# Patient Record
Sex: Female | Born: 1940 | Race: White | Hispanic: No | Marital: Married | Smoking: Never smoker
Health system: Southern US, Community
[De-identification: ages and names within clinical notes are randomized; demographics above are authoritative.]

## PROBLEM LIST (undated history)

## (undated) DIAGNOSIS — F419 Anxiety disorder, unspecified: Secondary | ICD-10-CM

## (undated) DIAGNOSIS — E876 Hypokalemia: Secondary | ICD-10-CM

## (undated) DIAGNOSIS — I10 Essential (primary) hypertension: Secondary | ICD-10-CM

## (undated) DIAGNOSIS — I639 Cerebral infarction, unspecified: Secondary | ICD-10-CM

## (undated) DIAGNOSIS — H8110 Benign paroxysmal vertigo, unspecified ear: Secondary | ICD-10-CM

## (undated) DIAGNOSIS — K449 Diaphragmatic hernia without obstruction or gangrene: Secondary | ICD-10-CM

## (undated) DIAGNOSIS — E785 Hyperlipidemia, unspecified: Secondary | ICD-10-CM

## (undated) DIAGNOSIS — D649 Anemia, unspecified: Secondary | ICD-10-CM

## (undated) DIAGNOSIS — R42 Dizziness and giddiness: Secondary | ICD-10-CM

## (undated) HISTORY — PX: TUBAL LIGATION: SHX77

## (undated) HISTORY — PX: CATARACT EXTRACTION: SUR2

---

## 2009-04-09 ENCOUNTER — Emergency Department (HOSPITAL_COMMUNITY): Admission: EM | Admit: 2009-04-09 | Discharge: 2009-04-09 | Payer: Self-pay | Admitting: Emergency Medicine

## 2013-02-02 SURGERY — LAPAROSCOPIC CHOLECYSTECTOMY WITH INTRAOPERATIVE CHOLANGIOGRAM
Anesthesia: General

## 2013-09-01 ENCOUNTER — Encounter (HOSPITAL_COMMUNITY): Payer: Self-pay | Admitting: *Deleted

## 2013-09-01 ENCOUNTER — Emergency Department (HOSPITAL_COMMUNITY): Payer: Medicare Other

## 2013-09-01 ENCOUNTER — Observation Stay (HOSPITAL_COMMUNITY)
Admission: EM | Admit: 2013-09-01 | Discharge: 2013-09-02 | Disposition: A | Discharge status: Home / Self Care | Payer: Medicare Other | Attending: Internal Medicine | Admitting: Internal Medicine

## 2013-09-01 DIAGNOSIS — R112 Nausea with vomiting, unspecified: Secondary | ICD-10-CM | POA: Diagnosis present

## 2013-09-01 DIAGNOSIS — E876 Hypokalemia: Secondary | ICD-10-CM | POA: Insufficient documentation

## 2013-09-01 DIAGNOSIS — I10 Essential (primary) hypertension: Secondary | ICD-10-CM | POA: Diagnosis present

## 2013-09-01 DIAGNOSIS — Z79899 Other long term (current) drug therapy: Secondary | ICD-10-CM | POA: Insufficient documentation

## 2013-09-01 DIAGNOSIS — F411 Generalized anxiety disorder: Secondary | ICD-10-CM | POA: Diagnosis present

## 2013-09-01 DIAGNOSIS — Z7982 Long term (current) use of aspirin: Secondary | ICD-10-CM | POA: Insufficient documentation

## 2013-09-01 DIAGNOSIS — R5381 Other malaise: Secondary | ICD-10-CM | POA: Insufficient documentation

## 2013-09-01 DIAGNOSIS — K449 Diaphragmatic hernia without obstruction or gangrene: Secondary | ICD-10-CM | POA: Insufficient documentation

## 2013-09-01 DIAGNOSIS — R42 Dizziness and giddiness: Principal | ICD-10-CM

## 2013-09-01 DIAGNOSIS — K219 Gastro-esophageal reflux disease without esophagitis: Secondary | ICD-10-CM | POA: Insufficient documentation

## 2013-09-01 HISTORY — DX: Diaphragmatic hernia without obstruction or gangrene: K44.9

## 2013-09-01 HISTORY — DX: Benign paroxysmal vertigo, unspecified ear: H81.10

## 2013-09-01 HISTORY — DX: Hypokalemia: E87.6

## 2013-09-01 HISTORY — DX: Anxiety disorder, unspecified: F41.9

## 2013-09-01 LAB — COMPREHENSIVE METABOLIC PANEL
ALT: 28 U/L (ref 0–35)
AST: 28 U/L (ref 0–37)
Albumin: 4.1 g/dL (ref 3.5–5.2)
Alkaline Phosphatase: 86 U/L (ref 39–117)
Glucose, Bld: 142 mg/dL — ABNORMAL HIGH (ref 70–99)
Potassium: 3.7 mEq/L (ref 3.5–5.1)
Sodium: 136 mEq/L (ref 135–145)
Total Protein: 7.3 g/dL (ref 6.0–8.3)

## 2013-09-01 LAB — CBC WITH DIFFERENTIAL/PLATELET
Basophils Absolute: 0 10*3/uL (ref 0.0–0.1)
Basophils Relative: 0 % (ref 0–1)
Eosinophils Absolute: 0 10*3/uL (ref 0.0–0.7)
Lymphs Abs: 1.7 10*3/uL (ref 0.7–4.0)
MCH: 31.5 pg (ref 26.0–34.0)
Neutrophils Relative %: 69 % (ref 43–77)
Platelets: 207 10*3/uL (ref 150–400)
RBC: 4.54 MIL/uL (ref 3.87–5.11)

## 2013-09-01 LAB — URINALYSIS, ROUTINE W REFLEX MICROSCOPIC
Bilirubin Urine: NEGATIVE
Glucose, UA: NEGATIVE mg/dL
Hgb urine dipstick: NEGATIVE
Ketones, ur: 40 mg/dL — AB
pH: 7 (ref 5.0–8.0)

## 2013-09-01 LAB — TROPONIN I: Troponin I: <0.3 ng/mL (ref <0.30)

## 2013-09-01 MED ORDER — ENOXAPARIN SODIUM 40 MG/0.4ML ~~LOC~~ SOLN: 40.0000 mg | SUBCUTANEOUS | Status: DC

## 2013-09-01 MED ORDER — SCOPOLAMINE 1 MG/3DAYS TD PT72
MEDICATED_PATCH | TRANSDERMAL | Status: AC
  Filled 2013-09-01: qty 1

## 2013-09-01 MED ORDER — SODIUM CHLORIDE 0.9 % IJ SOLN
3.0000 mL | Freq: Two times a day (BID) | INTRAMUSCULAR | Status: DC
  Administered 2013-09-02: 3 mL via INTRAVENOUS

## 2013-09-01 MED ORDER — LORATADINE 10 MG PO TABS
10.0000 mg | ORAL_TABLET | Freq: Every day | ORAL | Status: DC
  Administered 2013-09-01 – 2013-09-02 (×2): 10 mg via ORAL
  Filled 2013-09-01 (×2): qty 1

## 2013-09-01 MED ORDER — PANTOPRAZOLE SODIUM 40 MG PO TBEC
40.0000 mg | DELAYED_RELEASE_TABLET | Freq: Every day | ORAL | Status: DC
  Administered 2013-09-01 – 2013-09-02 (×2): 40 mg via ORAL
  Filled 2013-09-01 (×2): qty 1

## 2013-09-01 MED ORDER — POTASSIUM CHLORIDE IN NACL 20-0.9 MEQ/L-% IV SOLN
INTRAVENOUS | Status: DC
  Administered 2013-09-01 – 2013-09-02 (×2): via INTRAVENOUS

## 2013-09-01 MED ORDER — MECLIZINE HCL 12.5 MG PO TABS
25.0000 mg | ORAL_TABLET | Freq: Once | ORAL | Status: AC
  Administered 2013-09-01: 25 mg via ORAL
  Filled 2013-09-01: qty 2

## 2013-09-01 MED ORDER — MECLIZINE HCL 12.5 MG PO TABS
25.0000 mg | ORAL_TABLET | Freq: Three times a day (TID) | ORAL | Status: DC
  Administered 2013-09-01 – 2013-09-02 (×3): 25 mg via ORAL
  Filled 2013-09-01 (×3): qty 2

## 2013-09-01 MED ORDER — PROMETHAZINE HCL 25 MG/ML IJ SOLN
12.5000 mg | Freq: Four times a day (QID) | INTRAMUSCULAR | Status: DC | PRN
  Administered 2013-09-01: 25 mg via INTRAVENOUS
  Administered 2013-09-02: 12.5 mg via INTRAVENOUS
  Filled 2013-09-01 (×2): qty 1

## 2013-09-01 MED ORDER — SODIUM CHLORIDE 0.9 % IV BOLUS (SEPSIS)
500.0000 mL | Freq: Once | INTRAVENOUS | Status: AC
  Administered 2013-09-01: 500 mL via INTRAVENOUS

## 2013-09-01 MED ORDER — ALPRAZOLAM 0.5 MG PO TABS
0.5000 mg | ORAL_TABLET | Freq: Three times a day (TID) | ORAL | Status: DC
  Administered 2013-09-01 – 2013-09-02 (×3): 0.5 mg via ORAL
  Filled 2013-09-01 (×3): qty 1

## 2013-09-01 MED ORDER — SCOPOLAMINE 1 MG/3DAYS TD PT72
1.0000 | MEDICATED_PATCH | TRANSDERMAL | Status: DC
  Administered 2013-09-01: 1.5 mg via TRANSDERMAL
  Filled 2013-09-01 (×2): qty 1

## 2013-09-01 MED ORDER — LORAZEPAM 2 MG/ML IJ SOLN
0.5000 mg | Freq: Once | INTRAMUSCULAR | Status: AC
  Administered 2013-09-01: 0.5 mg via INTRAVENOUS
  Filled 2013-09-01: qty 1

## 2013-09-01 MED ORDER — SODIUM CHLORIDE 0.9 % IV BOLUS (SEPSIS)
1000.0000 mL | Freq: Once | INTRAVENOUS | Status: AC
  Administered 2013-09-01: 1000 mL via INTRAVENOUS

## 2013-09-01 MED ORDER — ACETAMINOPHEN 650 MG RE SUPP: 650.0000 mg | Freq: Four times a day (QID) | RECTAL | Status: DC | PRN

## 2013-09-01 MED ORDER — ACETAMINOPHEN 325 MG PO TABS: 650.0000 mg | ORAL_TABLET | Freq: Four times a day (QID) | ORAL | Status: DC | PRN

## 2013-09-01 MED ORDER — ONDANSETRON HCL 4 MG/2ML IJ SOLN
4.0000 mg | Freq: Once | INTRAMUSCULAR | Status: AC
  Administered 2013-09-01: 4 mg via INTRAVENOUS
  Filled 2013-09-01: qty 2

## 2013-09-01 MED ORDER — ASPIRIN 81 MG PO CHEW
81.0000 mg | CHEWABLE_TABLET | Freq: Every day | ORAL | Status: DC
  Administered 2013-09-01 – 2013-09-02 (×2): 81 mg via ORAL
  Filled 2013-09-01 (×2): qty 1

## 2013-09-01 MED ORDER — BISOPROLOL-HYDROCHLOROTHIAZIDE 10-6.25 MG PO TABS
1.0000 | ORAL_TABLET | Freq: Every day | ORAL | Status: DC
  Administered 2013-09-02: 1 via ORAL
  Filled 2013-09-01 (×4): qty 1

## 2013-09-01 NOTE — ED Notes (Signed)
Pt back from MRI 

## 2013-09-01 NOTE — ED Notes (Signed)
In to check  Orthostatic vital signs. Pt unable to change positions due to vomiting. EDP aware and verbal order given for zofran

## 2013-09-01 NOTE — H&P (Signed)
[facility] History and Physical  Nicole Cobb ZOX:[id number] DOB: 1941/01/07 DOA: 09/01/2013  Referring physician: Dr. Estell Harpin, ER physician PCP: Cassell Smiles., MD  Specialists:   Chief Complaint: Vertigo  HPI: Nicole Cobb is a 72 y.o. female who is relatively healthy. Past medical history includes hypertension, anxiety and GERD. The patient presented with acute onset of vertigo which began this morning. This is associated with nausea and vomiting. She reports her symptoms are significantly worse to return she moves her head. She is unable to standard her symptoms. Denies any chest pain, shortness of breath, cough, fever, recent upper respiratory tract infection, she has not been started on any new medications, no dysuria, diarrhea, abdominal pain. She was brought to the emergency room for evaluation where MRI of the brain did not show any acute infarct. CT of the head was also unremarkable. Basic labs urinalysis was unrevealing. She's been recommended for overnight observation for management of his symptoms.  Review of Systems: Pertinent positives as per history of present illness, otherwise negative  Past Medical History  Diagnosis Date  . Hiatal hernia   . Anxiety   . Hypokalemia    Past Surgical History  Procedure Laterality Date  . Tubal ligation     Social History:  reports that she has never smoked. She does not have any smokeless tobacco history on file. She reports that she does not drink alcohol. Her drug history is not on file.   No Known Allergies  Family history: Mother died of coronary artery disease, father died of cancer  Prior to Admission medications   Medication Sig Start Date End Date Taking? Authorizing Provider  ALPRAZolam Prudy Feeler) 0.5 MG tablet Take 0.5 mg by mouth at bedtime as needed for sleep or anxiety.   Yes Historical Provider, MD  aspirin 81 MG tablet Take 81 mg by mouth daily.   Yes Historical Provider, MD   bisoprolol-hydrochlorothiazide (ZIAC) 10-6.25 MG per tablet Take 1 tablet by mouth daily.   Yes Historical Provider, MD  Cholecalciferol (D3-1000 PO) Take 1,000 mg by mouth daily.   Yes Historical Provider, MD  loratadine (CLARITIN) 10 MG tablet Take 10 mg by mouth daily.   Yes Historical Provider, MD  Misc Natural Products (LUTEIN 20 PO) Take 20 mg by mouth daily.   Yes Historical Provider, MD  naproxen sodium (ANAPROX) 220 MG tablet Take 220 mg by mouth 2 (two) times daily as needed (pain).   Yes Historical Provider, MD  Omega-3 Fatty Acids (OMEGA 3 PO) Take 1 tablet by mouth daily.   Yes Historical Provider, MD  omeprazole (PRILOSEC) 20 MG capsule Take 20 mg by mouth daily.   Yes Historical Provider, MD  vitamin B-12 (CYANOCOBALAMIN) 500 MCG tablet Take 500 mcg by mouth daily.   Yes Historical Provider, MD   Physical Exam: Filed Vitals:   09/01/13 1749  BP: 144/67  Pulse: 70  Temp: 97.4 F (36.3 C)  Resp:      General:  No acute distress  Eyes: Pupils are equal round react to light  ENT: Mucous membranes are moist  Neck: Supple  Cardiovascular: S1, S2, regular rate and rhythm  Respiratory: Clear to auscultation bilaterally  Abdomen: Soft, nontender, positive bowel sounds  Skin: Normal  Musculoskeletal: No pedal edema bilaterally  Psychiatric: Very nervous, pleasant  Neurologic:  Grossly intact, nonfocal  Labs on Admission:  Basic Metabolic Panel:  Recent Labs Lab 09/01/13 1010  NA 136  K 3.7  CL 99  CO2 23  GLUCOSE  142*  BUN 11  CREATININE 0.57  CALCIUM 9.7   Liver Function Tests:  Recent Labs Lab 09/01/13 1010  AST 28  ALT 28  ALKPHOS 86  BILITOT 0.5  PROT 7.3  ALBUMIN 4.1   No results found for this basename: LIPASE, AMYLASE,  in the last 168 hours No results found for this basename: AMMONIA,  in the last 168 hours CBC:  Recent Labs Lab 09/01/13 1010  WBC 6.8  NEUTROABS 4.7  HGB 14.3  HCT 40.5  MCV 89.2  PLT 207   Cardiac  Enzymes: No results found for this basename: CKTOTAL, CKMB, CKMBINDEX, TROPONINI,  in the last 168 hours  BNP (last 3 results) No results found for this basename: PROBNP,  in the last 8760 hours CBG: No results found for this basename: GLUCAP,  in the last 168 hours  Radiological Exams on Admission: Ct Head Wo Contrast  09/01/2013   *RADIOLOGY REPORT*  Clinical Data: Dizziness, weakness, nausea  CT HEAD WITHOUT CONTRAST  Technique:  Contiguous axial images were obtained from the base of the skull through the vertex without contrast.  Comparison: None.  Findings: Mild brain atrophy.  No acute intracranial hemorrhage, mass lesion, acute infarction, midline shift, herniation, hydrocephalus, or extra-axial fluid collection.  Gray-white matter differentiation maintained.  Cisterns patent.  No cerebellar abnormality.  Mastoids and sinuses clear.  IMPRESSION: Mild atrophy.  No acute finding.   Original Report Authenticated By: Judie Petit. Miles Costain, M.D.   Mr Brain Wo Contrast  09/01/2013   *RADIOLOGY REPORT*  Clinical Data: Awoke with severe dizziness, weakness, nausea and vomiting.  MRI HEAD WITHOUT CONTRAST  Technique:  Multiplanar, multiecho pulse sequences of the brain and surrounding structures were obtained according to standard protocol without intravenous contrast.  Comparison: 09/01/2013 head CT.  Findings: No acute infarct. Artifact central pons (series 3 image 35)  No intracranial hemorrhage.  Remote tiny left cerebellar infarct.  Very mild small vessel disease type changes.  No hydrocephalus.  Major intracranial vascular structures are patent.  Mild age related atrophy without hydrocephalus.  No intracranial mass lesion detected on this unenhanced exam.  Minimal mucosal thickening ethmoid sinus air cells.  Cervical medullary junction, pituitary region, pineal region and orbital structures unremarkable.  Mild hyperostosis frontalis interna.  IMPRESSION: No acute infarct.  Very mild small vessel disease type  changes.   Original Report Authenticated By: Lacy Duverney, M.D.    EKG: Independently reviewed. Normal EKG  Assessment/Plan Active Problems:   Vertigo   Nausea with vomiting   Essential hypertension, benign   Anxiety state, unspecified   1. Vertigo, likely benign positional. We'll start the patient on Antivert/scopolamine patch. We'll request a physical therapy consultation for vestibular therapy. Check orthostatics, monitor on telemetry, check TSH and cardiac markers. 2. Anxiety. Continue Xanax which she takes at home. 3. Nausea and vomiting. A cumulative number one, treat supportively 4. Hypertension. Appears controlled, continue outpatient regimen.  Code Status: full code Family Communication: discussed with patient and family at the bedside Disposition Plan: probable discharge home in am  Time spent:  MEMON,JEHANZEB [facility] Pager [phone]  If 7PM-7AM, please contact night-coverage [email or web address] Password [facility] 09/01/2013, 7:39 PM

## 2013-09-01 NOTE — ED Provider Notes (Signed)
CSN: [id number]     Arrival date & time 09/01/13  4098 History  This chart was scribed for Benny Lennert, MD, by Yevette Edwards, ED Scribe. This patient was seen in room APA08/APA08 and the patient's care was started at 9:54 AM.  First MD Initiated Contact with Patient 09/01/13 [phone]     Chief Complaint  Patient presents with  . Dizziness    Patient is a 72 y.o. female presenting with weakness. The history is provided by the patient. No language interpreter was used.  Weakness This is a new problem. The current episode started 3 to 5 hours ago. The problem occurs constantly. The problem has not changed since onset.Associated symptoms include headaches. Pertinent negatives include no chest pain and no abdominal pain. Nothing aggravates the symptoms. Nothing relieves the symptoms. She has tried nothing for the symptoms.   HPI Comments: CALIAH KOPKE is a 72 y.o. female, with a h/o hypokalemia, who presents to the Emergency Department complaining of acute, constant dizziness which began this morning upon awakening. The pt has also experienced generalized weakness, a headache, nausea, and several episodes of emesis. She denies a fever, cough, vertigo, neck pain, and congestion.   Past Medical History  Diagnosis Date  . Hiatal hernia   . Anxiety   . Hypokalemia    Past Surgical History  Procedure Laterality Date  . Tubal ligation     No family history on file. History  Substance Use Topics  . Smoking status: Never Smoker   . Smokeless tobacco: Not on file  . Alcohol Use: No   No OB history provided.  Review of Systems  Constitutional: Negative for fever, appetite change and fatigue.  HENT: Negative for congestion, neck pain, sinus pressure and ear discharge.   Eyes: Negative for discharge.  Respiratory: Negative for cough.   Cardiovascular: Negative for chest pain.  Gastrointestinal: Positive for nausea and vomiting. Negative for abdominal pain and diarrhea.  Genitourinary:  Negative for frequency and hematuria.  Musculoskeletal: Negative for back pain.  Skin: Negative for rash.  Neurological: Positive for weakness and headaches. Negative for seizures.  Psychiatric/Behavioral: Negative for hallucinations.    Allergies  Review of patient's allergies indicates no known allergies.  Home Medications  No current outpatient prescriptions on file.  Triage Vitals: BP 158/70  Pulse 66  Resp 20  Ht 5\' 4"  (1.626 m)  Wt 159 lb (72.122 kg)  BMI 27.28 kg/m2  SpO2 99%  Physical Exam  Constitutional: She is oriented to person, place, and time. She appears well-developed.  HENT:  Head: Normocephalic.  Eyes: Conjunctivae and EOM are normal. No scleral icterus.  Neck: Neck supple. No thyromegaly present.  Cardiovascular: Normal rate and regular rhythm.  Exam reveals no gallop and no friction rub.   No murmur heard. Pulmonary/Chest: No stridor. She has no wheezes. She has no rales. She exhibits no tenderness.  Abdominal: She exhibits no distension. There is no tenderness. There is no rebound.  Musculoskeletal: Normal range of motion. She exhibits no edema.  Lymphadenopathy:    She has no cervical adenopathy.  Neurological: She is oriented to person, place, and time. Coordination normal.  Skin: No rash noted. No erythema.  Psychiatric: She has a normal mood and affect. Her behavior is normal.    ED Course  Procedures (including critical care time)  DIAGNOSTIC STUDIES: Oxygen Saturation is 99% on room air, normal by my interpretation.    COORDINATION OF CARE:  9:59 AM- Discussed treatment plan with patient, and  the patient agreed to the plan.   Labs Review Labs Reviewed  COMPREHENSIVE METABOLIC PANEL - Abnormal; Notable for the following:    Glucose, Bld 142 (*)    All other components within normal limits  CBC WITH DIFFERENTIAL   Imaging Review Ct Head Wo Contrast  09/01/2013   *RADIOLOGY REPORT*  Clinical Data: Dizziness, weakness, nausea  CT HEAD  WITHOUT CONTRAST  Technique:  Contiguous axial images were obtained from the base of the skull through the vertex without contrast.  Comparison: None.  Findings: Mild brain atrophy.  No acute intracranial hemorrhage, mass lesion, acute infarction, midline shift, herniation, hydrocephalus, or extra-axial fluid collection.  Gray-white matter differentiation maintained.  Cisterns patent.  No cerebellar abnormality.  Mastoids and sinuses clear.  IMPRESSION: Mild atrophy.  No acute finding.   Original Report Authenticated By: Judie Petit. Shick, M.D.    MDM  No diagnosis found.  The chart was scribed for me under my direct supervision.  I personally performed the history, physical, and medical decision making and all procedures in the evaluation of this patient.Benny Lennert, MD 09/05/13 2136

## 2013-09-01 NOTE — ED Notes (Signed)
Pt requesting to use bedpan - voided 450cc

## 2013-09-01 NOTE — ED Notes (Signed)
C/o dizziness, unable to raise head,nausea and vomiting, denies pain

## 2013-09-02 ENCOUNTER — Encounter (HOSPITAL_COMMUNITY): Payer: Self-pay | Admitting: Internal Medicine

## 2013-09-02 LAB — BASIC METABOLIC PANEL
BUN: 7 mg/dL (ref 6–23)
CO2: 24 mEq/L (ref 19–32)
Calcium: 8.9 mg/dL (ref 8.4–10.5)
Chloride: 106 mEq/L (ref 96–112)
Creatinine, Ser: 0.59 mg/dL (ref 0.50–1.10)
Glucose, Bld: 106 mg/dL — ABNORMAL HIGH (ref 70–99)

## 2013-09-02 LAB — CBC
MCV: 90.8 fL (ref 78.0–100.0)
Platelets: 206 10*3/uL (ref 150–400)
RBC: 4.36 MIL/uL (ref 3.87–5.11)
RDW: 13.2 % (ref 11.5–15.5)
WBC: 7.6 10*3/uL (ref 4.0–10.5)

## 2013-09-02 LAB — TROPONIN I
Troponin I: <0.3 ng/mL (ref <0.30)
Troponin I: <0.3 ng/mL (ref <0.30)

## 2013-09-02 LAB — TSH: TSH: 1.099 u[IU]/mL (ref 0.350–4.500)

## 2013-09-02 MED ORDER — ALPRAZOLAM 0.5 MG PO TABS: 0.5000 mg | ORAL_TABLET | Freq: Two times a day (BID) | ORAL | Status: DC | PRN

## 2013-09-02 MED ORDER — MECLIZINE HCL 25 MG PO TABS: 25.0000 mg | ORAL_TABLET | Freq: Three times a day (TID) | ORAL | Status: DC | PRN

## 2013-09-02 MED ORDER — SCOPOLAMINE 1 MG/3DAYS TD PT72: 1.0000 | MEDICATED_PATCH | TRANSDERMAL | Status: DC

## 2013-09-02 MED ORDER — PROMETHAZINE HCL 12.5 MG PO TABS: 25.0000 mg | ORAL_TABLET | Freq: Four times a day (QID) | ORAL | Status: DC | PRN

## 2013-09-02 NOTE — Discharge Summary (Signed)
Physician Discharge Summary  Nicole Cobb ZOX:[id number] DOB: 06-Jul-1941 DOA: 09/01/2013  PCP: Cassell Smiles., MD  Admit date: 09/01/2013 Discharge date: 09/02/2013  Time spent: 45 minutes  Recommendations for Outpatient Follow-up:  1. Followup with primary care physician in one to 2 weeks  Discharge Diagnoses:  Active Problems:   Vertigo   Nausea with vomiting   Essential hypertension, benign   Anxiety state, unspecified   Discharge Condition: Improved  Diet recommendation: Low-salt  Filed Weights   09/01/13 0903 09/01/13 1749  Weight: 72.122 kg (159 lb) 72.122 kg (159 lb)    History of present illness:  Nicole Cobb is a 72 y.o. female who is relatively healthy. Past medical history includes hypertension, anxiety and GERD. The patient presented with acute onset of vertigo which began this morning. This is associated with nausea and vomiting. She reports her symptoms are significantly worse to return she moves her head. She is unable to standard her symptoms. Denies any chest pain, shortness of breath, cough, fever, recent upper respiratory tract infection, she has not been started on any new medications, no dysuria, diarrhea, abdominal pain. She was brought to the emergency room for evaluation where MRI of the brain did not show any acute infarct. CT of the head was also unremarkable. Basic labs urinalysis was unrevealing. She's been recommended for overnight observation for management of his symptoms   Hospital Course:  This lady was admitted to the hospital with intractable vertigo, nausea and vomiting. She was seen in the emergency room where MRI did not reveal any acute infarct. It was felt that her some more likely related to benign positional vertigo. She was admitted for observation for symptom control. She was started on Antivert, scopolamine patch, Phenergan for nausea. She was not orthostatic on admission. The following day, she reported a marked improvement in  her symptoms. She was seen by physical therapy and occupational therapy who did not recommend any further outpatient therapy. She'll be set up with home health RN. She is felt stable for discharge home today.  Procedures:  none  Consultations:  none  Discharge Exam: Filed Vitals:   09/02/13 1417  BP: 135/58  Pulse: 79  Temp: 97.5 F (36.4 C)  Resp: 20    General: NAD Cardiovascular: S1, S2 RRR Respiratory: CTA B  Discharge Instructions  Discharge Orders   Future Orders Complete By Expires   Call MD for:  extreme fatigue  As directed    Call MD for:  persistant dizziness or light-headedness  As directed    Diet - low sodium heart healthy  As directed    Face-to-face encounter (required for Medicare/Medicaid patients)  As directed    Comments:     I MEMON,JEHANZEB certify that this patient is under my care and that I, or a nurse practitioner or physician's assistant working with me, had a face-to-face encounter that meets the physician face-to-face encounter requirements with this patient on 09/02/2013. The encounter with the patient was in whole, or in part for the following medical condition(s) which is the primary reason for home health care (List medical condition): admitted with severe vertigo and anxiety, would benefit from home health RN since she is a high re admission risk   Questions:     The encounter with the patient was in whole, or in part, for the following medical condition, which is the primary reason for home health care:  vertigo, anxiety   I certify that, based on my findings, the following services are  medically necessary home health services:  Nursing   My clinical findings support the need for the above services:  Unsafe ambulation due to balance issues   Further, I certify that my clinical findings support that this patient is homebound due to:  Unable to leave home safely without assistance   Reason for Medically Necessary Home Health Services:  Skilled  Nursing- Skilled Assessment/Observation   Home Health  As directed    Questions:     To provide the following care/treatments:  RN   Increase activity slowly  As directed        Medication List         ALPRAZolam 0.5 MG tablet  Commonly known as:  XANAX  Take 1 tablet (0.5 mg total) by mouth 2 (two) times daily as needed for sleep or anxiety.     aspirin 81 MG tablet  Take 81 mg by mouth daily.     bisoprolol-hydrochlorothiazide 10-6.25 MG per tablet  Commonly known as:  ZIAC  Take 1 tablet by mouth daily.     D3-1000 PO  Take 1,000 mg by mouth daily.     loratadine 10 MG tablet  Commonly known as:  CLARITIN  Take 10 mg by mouth daily.     LUTEIN 20 PO  Take 20 mg by mouth daily.     meclizine 25 MG tablet  Commonly known as:  ANTIVERT  Take 1 tablet (25 mg total) by mouth 3 (three) times daily as needed for dizziness or nausea.     naproxen sodium 220 MG tablet  Commonly known as:  ANAPROX  Take 220 mg by mouth 2 (two) times daily as needed (pain).     OMEGA 3 PO  Take 1 tablet by mouth daily.     omeprazole 20 MG capsule  Commonly known as:  PRILOSEC  Take 20 mg by mouth daily.     promethazine 12.5 MG tablet  Commonly known as:  PHENERGAN  Take 2 tablets (25 mg total) by mouth every 6 (six) hours as needed for nausea.     scopolamine 1.5 MG  Commonly known as:  TRANSDERM-SCOP  Place 1 patch (1.5 mg total) onto the skin every 3 (three) days.     vitamin B-12 500 MCG tablet  Commonly known as:  CYANOCOBALAMIN  Take 500 mcg by mouth daily.       No Known Allergies    The results of significant diagnostics from this hospitalization (including imaging, microbiology, ancillary and laboratory) are listed below for reference.    Significant Diagnostic Studies: Ct Head Wo Contrast  09/01/2013   *RADIOLOGY REPORT*  Clinical Data: Dizziness, weakness, nausea  CT HEAD WITHOUT CONTRAST  Technique:  Contiguous axial images were obtained from the base of the  skull through the vertex without contrast.  Comparison: None.  Findings: Mild brain atrophy.  No acute intracranial hemorrhage, mass lesion, acute infarction, midline shift, herniation, hydrocephalus, or extra-axial fluid collection.  Gray-white matter differentiation maintained.  Cisterns patent.  No cerebellar abnormality.  Mastoids and sinuses clear.  IMPRESSION: Mild atrophy.  No acute finding.   Original Report Authenticated By: Judie Petit. Miles Costain, M.D.   Mr Brain Wo Contrast  09/01/2013   *RADIOLOGY REPORT*  Clinical Data: Awoke with severe dizziness, weakness, nausea and vomiting.  MRI HEAD WITHOUT CONTRAST  Technique:  Multiplanar, multiecho pulse sequences of the brain and surrounding structures were obtained according to standard protocol without intravenous contrast.  Comparison: 09/01/2013 head CT.  Findings: No acute infarct.  Artifact central pons (series 3 image 35)  No intracranial hemorrhage.  Remote tiny left cerebellar infarct.  Very mild small vessel disease type changes.  No hydrocephalus.  Major intracranial vascular structures are patent.  Mild age related atrophy without hydrocephalus.  No intracranial mass lesion detected on this unenhanced exam.  Minimal mucosal thickening ethmoid sinus air cells.  Cervical medullary junction, pituitary region, pineal region and orbital structures unremarkable.  Mild hyperostosis frontalis interna.  IMPRESSION: No acute infarct.  Very mild small vessel disease type changes.   Original Report Authenticated By: Lacy Duverney, M.D.    Microbiology: No results found for this or any previous visit (from the past 240 hour(s)).   Labs: Basic Metabolic Panel:  Recent Labs Lab 09/01/13 1010 09/02/13 0747  NA 136 140  K 3.7 3.8  CL 99 106  CO2 23 24  GLUCOSE 142* 106*  BUN 11 7  CREATININE 0.57 0.59  CALCIUM 9.7 8.9   Liver Function Tests:  Recent Labs Lab 09/01/13 1010  AST 28  ALT 28  ALKPHOS 86  BILITOT 0.5  PROT 7.3  ALBUMIN 4.1   No  results found for this basename: LIPASE, AMYLASE,  in the last 168 hours No results found for this basename: AMMONIA,  in the last 168 hours CBC:  Recent Labs Lab 09/01/13 1010 09/02/13 0747  WBC 6.8 7.6  NEUTROABS 4.7  --   HGB 14.3 13.6  HCT 40.5 39.6  MCV 89.2 90.8  PLT 207 206   Cardiac Enzymes:  Recent Labs Lab 09/01/13 1956 09/02/13 0152 09/02/13 0747  TROPONINI <0.30 <0.30 <0.30   BNP: BNP (last 3 results) No results found for this basename: PROBNP,  in the last 8760 hours CBG: No results found for this basename: GLUCAP,  in the last 168 hours     Signed:  MEMON,JEHANZEB  [facility] 09/02/2013, 2:27 PM

## 2013-09-02 NOTE — Evaluation (Signed)
Physical Therapy Evaluation Patient Details Name: Nicole Cobb MRN: [id number] DOB: 1941/11/09 Today's Date: 09/02/2013 Time: 4098-1191 PT Time Calculation (min): 36 min  PT Assessment / Plan / Recommendation History of Present Illness  Pt is admitted with positional vertigo.  She awoke yesterday AM with sudden onset of dizziness in any position, so severe that she had nausea and vomiting.  She reports that she is normaly very sensitive to positional changes...i.e. gets car sick easily, cannot sit on a swing, etc.  She has been medicated for vertigo and nausea and today reports a significant improvement of sx.  Clinical Impression   Pt was seen for an evaluation of vertigo and mobility as it relates to her vertigo.  She is very alert and cooperative, almost apologetic for her sx.  She has excellent family support and reams of DME at home.  Currently, her vertigo occurs primarily with QUICK positional head changes.  When she has dizziness, it resolves within 5 seconds if she holds that position.  She was instructed in maintaining SLOW positional or head movements until all sx resolve.   She was also instructed in use of a cane for maximal security while walking.  She and family were instructed in Habituation and Gaze exercise.  She is not highly interested in doing much exercise...she is hoping that her sx continue to resolve with medication and with the control she can exert by managing the speed of her body motions.  OT will be evaluating and may see something that I missed.    PT Assessment  Patent does not need any further PT services    Follow Up Recommendations  No PT follow up    Does the patient have the potential to tolerate intense rehabilitation      Barriers to Discharge        Equipment Recommendations  None recommended by PT    Recommendations for Other Services     Frequency      Precautions / Restrictions Precautions Precautions: Fall Restrictions Weight Bearing  Restrictions: No   Pertinent Vitals/Pain       Mobility  Bed Mobility Bed Mobility: Rolling Right;Supine to Sit Rolling Right: 7: Independent Supine to Sit: 7: Independent Details for Bed Mobility Assistance: pt has no  dizziness in the supine /flat position, however does have dizziness that lasts for no more than 5 seconds when turning to sidelying.........,when transferring to sitting, if she moves very slowly she has no dizziness Transfers Transfers: Sit to Stand;Stand to Sit Sit to Stand: 7: Independent Stand to Sit: 7: Independent Details for Transfer Assistance: no dizziness with standing and when asked to turn her head side to side she has no dizziness Ambulation/Gait Ambulation/Gait Assistance: 5: Supervision Ambulation Distance (Feet): 200 Feet Assistive device: None;Straight cane Ambulation/Gait Assistance Details: pt lost balance x2 while walking and turning her head quickly....she had to be righted by therapist.  She was instructed in use of a cane for maximal stability and this was very helpful to her Gait Pattern: Within Functional Limits Gait velocity: WNL Stairs: No Wheelchair Mobility Wheelchair Mobility: No    Exercises Other Exercises Other Exercises: habituation exercises Other Exercises: gaze exercises   PT Diagnosis:    PT Problem List:   PT Treatment Interventions:       PT Goals(Current goals can be found in the care plan section) Acute Rehab PT Goals PT Goal Formulation: No goals set, d/c therapy  Visit Information  Last PT Received On: 09/02/13 History of Present  Illness: Pt is admitted with positional vertigo.  She awoke yesterday AM with sudden onset of dizziness in any position, so severe that she had nausea and vomiting.  She reports that she is normaly very sensitive to positional changes...i.e. gets car sick easily, cannot sit on a swing, etc.  She has been medicated for vertigo and nausea and today reports a significant improvement of sx.        Prior Functioning  Home Living Family/patient expects to be discharged to:: Private residence Living Arrangements: Spouse/significant other Available Help at Discharge: Family;Available 24 hours/day Type of Home: House Home Access: Stairs to enter [employer] of Steps: 2 Entrance Stairs-Rails: Right Home Layout: One level Home Equipment: Walker - 2 wheels;Cane - single point;Crutches;Bedside commode;Shower seat;Wheelchair - manual Prior Function Level of Independence: Independent Communication Communication: No difficulties    Cognition  Cognition Arousal/Alertness: Awake/alert Behavior During Therapy: WFL for tasks assessed/performed Overall Cognitive Status: Within Functional Limits for tasks assessed    Extremity/Trunk Assessment Upper Extremity Assessment Upper Extremity Assessment: Defer to OT evaluation Lower Extremity Assessment Lower Extremity Assessment: Overall WFL for tasks assessed Cervical / Trunk Assessment Cervical / Trunk Assessment: Normal   Balance Balance Balance Assessed: Yes Dynamic Standing Balance Dynamic Standing - Balance Support: Right upper extremity supported Dynamic Standing - Level of Assistance: 6: Modified independent (Device/Increase time)  End of Session PT - End of Session Equipment Utilized During Treatment: Gait belt Activity Tolerance: Patient tolerated treatment well Patient left: in bed;with call bell/phone within reach  GP Functional Assessment Tool Used: clinical judgement Functional Limitation: Mobility: Walking and moving around Mobility: Walking and Moving Around Current Status (Z6109): 0 percent impaired, limited or restricted Mobility: Walking and Moving Around Goal Status (U0454): 0 percent impaired, limited or restricted Mobility: Walking and Moving Around Discharge Status [phone]): 0 percent impaired, limited or restricted   Myrlene Broker L 09/02/2013, 11:35 AM

## 2013-09-02 NOTE — Progress Notes (Signed)
09/02/13 1753 Reviewed discharge instructions with patient, husband and daughter at bedside. Given copy of instructions, medication list, prescriptions, f/u appointment information. Verbalized understanding of instructions and when to call MD. Dizziness education sheet discussed and provided for patient. IV site d/c'd within normal limits. Notified CMT of telemetry d/c'd for discharge this afternoon. [facility]/c accompanied by nurse tech. Discharged home. Earnstine Regal, RN

## 2013-09-02 NOTE — Evaluation (Signed)
Occupational Therapy Evaluation Patient Details Name: Nicole Cobb MRN: [id number] DOB: [birth year] Today's Date: 09/02/2013 Time: 7829-5621 OT Time Calculation (min): 14 min  OT Assessment / Plan / Recommendation History of present illness Pt is admitted with positional vertigo.  She awoke yesterday AM with sudden onset of dizziness in any position, so severe that she had nausea and vomiting.  She reports that she is normaly very sensitive to positional changes...i.e. gets car sick easily, cannot sit on a swing, etc.  She has been medicated for vertigo and nausea and today reports a significant improvement of sx.   Clinical Impression   OT vestibular evaluation completed after PT. See PT note for additional information. Provided patient and family with education regarding vestibular exercises. Handout given of exercises to perform with demonstration. Patient very appreciative of education.    OT Assessment  Patient does not need any further OT services    Follow Up Recommendations  No OT follow up    Barriers to Discharge    None  Equipment Recommendations  None recommended by OT          Precautions / Restrictions Precautions Precautions: Fall Restrictions Weight Bearing Restrictions: No   Pertinent Vitals/Pain No complaints.         OT Goals(Current goals can be found in the care plan section)  1 time visit.  Visit Information  Last OT Received On: 09/02/13 Assistance Needed: +1 History of Present Illness: Pt is admitted with positional vertigo.  She awoke yesterday AM with sudden onset of dizziness in any position, so severe that she had nausea and vomiting.  She reports that she is normaly very sensitive to positional changes...i.e. gets car sick easily, cannot sit on a swing, etc.  She has been medicated for vertigo and nausea and today reports a significant improvement of sx.       Prior Functioning     Home Living Family/patient expects to be discharged  to:: Private residence Living Arrangements: Spouse/significant other Available Help at Discharge: Family;Available 24 hours/day Type of Home: House Home Access: Stairs to enter [employer] of Steps: 2 Entrance Stairs-Rails: Right Home Layout: One level Home Equipment: Walker - 2 wheels;Cane - single point;Crutches;Bedside commode;Shower seat;Wheelchair - manual Prior Function Level of Independence: Independent Communication Communication: No difficulties         Vision/Perception Vision - History Baseline Vision: Wears glasses all the time Patient Visual Report: Nausea/blurring vision with head movement Vision - Assessment Eye Alignment: Within Functional Limits Vision Assessment: Vision tested Ocular Range of Motion: Within Functional Limits Tracking/Visual Pursuits: Able to track stimulus in all quads without difficulty Saccades: Within functional limits Convergence: Within functional limits Perception Perception: Within Functional Limits Praxis Praxis: Intact   Cognition  Cognition Arousal/Alertness: Awake/alert Behavior During Therapy: WFL for tasks assessed/performed Overall Cognitive Status: Within Functional Limits for tasks assessed    Extremity/Trunk Assessment Upper Extremity Assessment Upper Extremity Assessment: Overall WFL for tasks assessed Lower Extremity Assessment Lower Extremity Assessment: Defer to PT evaluation Cervical / Trunk Assessment Cervical / Trunk Assessment: Normal        Exercise Other Exercises Other Exercises: Vestibular exercises given with a handout and demonstration. Other Exercises: gaze exercises      End of Session OT - End of Session Activity Tolerance: Patient tolerated treatment well Patient left: in bed;with call bell/phone within reach;with family/visitor present  GO Functional Limitation: Other OT primary Other OT Primary Current Status (H0865): 0 percent impaired, limited or restricted Other OT Primary  Goal Status (Z6109): 0 percent impaired, limited or restricted Other OT Primary Discharge Status [phone]): 0 percent impaired, limited or restricted   Limmie Patricia, OTR/L,CBIS   09/02/2013, 12:14 PM

## 2013-11-23 ENCOUNTER — Other Ambulatory Visit (HOSPITAL_COMMUNITY): Payer: Self-pay | Admitting: Internal Medicine

## 2013-11-23 DIAGNOSIS — Z139 Encounter for screening, unspecified: Secondary | ICD-10-CM

## 2013-12-01 ENCOUNTER — Ambulatory Visit (HOSPITAL_COMMUNITY)
Admission: RE | Admit: 2013-12-01 | Discharge: 2013-12-01 | Disposition: A | Discharge status: Home / Self Care | Payer: Medicare Other | Source: Ambulatory Visit | Attending: Internal Medicine | Admitting: Internal Medicine

## 2013-12-01 DIAGNOSIS — Z139 Encounter for screening, unspecified: Secondary | ICD-10-CM

## 2013-12-01 DIAGNOSIS — Z1231 Encounter for screening mammogram for malignant neoplasm of breast: Secondary | ICD-10-CM | POA: Insufficient documentation

## 2014-07-15 ENCOUNTER — Inpatient Hospital Stay (HOSPITAL_COMMUNITY): Payer: Medicare Other

## 2014-07-15 ENCOUNTER — Inpatient Hospital Stay (HOSPITAL_COMMUNITY)
Admission: EM | Admit: 2014-07-15 | Discharge: 2014-07-17 | DRG: 641 | Disposition: A | Discharge status: Home / Self Care | Payer: Medicare Other | Attending: Internal Medicine | Admitting: Internal Medicine

## 2014-07-15 ENCOUNTER — Encounter (HOSPITAL_COMMUNITY): Payer: Self-pay | Admitting: Emergency Medicine

## 2014-07-15 DIAGNOSIS — Z833 Family history of diabetes mellitus: Secondary | ICD-10-CM | POA: Diagnosis not present

## 2014-07-15 DIAGNOSIS — Z79899 Other long term (current) drug therapy: Secondary | ICD-10-CM | POA: Diagnosis not present

## 2014-07-15 DIAGNOSIS — E871 Hypo-osmolality and hyponatremia: Principal | ICD-10-CM | POA: Diagnosis present

## 2014-07-15 DIAGNOSIS — K529 Noninfective gastroenteritis and colitis, unspecified: Secondary | ICD-10-CM | POA: Diagnosis present

## 2014-07-15 DIAGNOSIS — T502X5A Adverse effect of carbonic-anhydrase inhibitors, benzothiadiazides and other diuretics, initial encounter: Secondary | ICD-10-CM | POA: Diagnosis present

## 2014-07-15 DIAGNOSIS — K219 Gastro-esophageal reflux disease without esophagitis: Secondary | ICD-10-CM | POA: Diagnosis present

## 2014-07-15 DIAGNOSIS — I10 Essential (primary) hypertension: Secondary | ICD-10-CM | POA: Diagnosis present

## 2014-07-15 DIAGNOSIS — R7402 Elevation of levels of lactic acid dehydrogenase (LDH): Secondary | ICD-10-CM | POA: Diagnosis present

## 2014-07-15 DIAGNOSIS — E861 Hypovolemia: Secondary | ICD-10-CM | POA: Diagnosis present

## 2014-07-15 DIAGNOSIS — D509 Iron deficiency anemia, unspecified: Secondary | ICD-10-CM | POA: Diagnosis present

## 2014-07-15 DIAGNOSIS — E876 Hypokalemia: Secondary | ICD-10-CM | POA: Diagnosis present

## 2014-07-15 DIAGNOSIS — K5289 Other specified noninfective gastroenteritis and colitis: Secondary | ICD-10-CM | POA: Diagnosis present

## 2014-07-15 DIAGNOSIS — F411 Generalized anxiety disorder: Secondary | ICD-10-CM

## 2014-07-15 DIAGNOSIS — Z7982 Long term (current) use of aspirin: Secondary | ICD-10-CM | POA: Diagnosis not present

## 2014-07-15 DIAGNOSIS — R7989 Other specified abnormal findings of blood chemistry: Secondary | ICD-10-CM | POA: Diagnosis present

## 2014-07-15 DIAGNOSIS — R112 Nausea with vomiting, unspecified: Secondary | ICD-10-CM | POA: Diagnosis present

## 2014-07-15 DIAGNOSIS — E7800 Pure hypercholesterolemia, unspecified: Secondary | ICD-10-CM | POA: Diagnosis present

## 2014-07-15 DIAGNOSIS — R7401 Elevation of levels of liver transaminase levels: Secondary | ICD-10-CM | POA: Diagnosis present

## 2014-07-15 DIAGNOSIS — R531 Weakness: Secondary | ICD-10-CM | POA: Diagnosis present

## 2014-07-15 DIAGNOSIS — R740 Nonspecific elevation of levels of transaminase and lactic acid dehydrogenase [LDH]: Secondary | ICD-10-CM

## 2014-07-15 DIAGNOSIS — R945 Abnormal results of liver function studies: Secondary | ICD-10-CM

## 2014-07-15 DIAGNOSIS — D649 Anemia, unspecified: Secondary | ICD-10-CM

## 2014-07-15 DIAGNOSIS — E785 Hyperlipidemia, unspecified: Secondary | ICD-10-CM | POA: Diagnosis present

## 2014-07-15 DIAGNOSIS — Z807 Family history of other malignant neoplasms of lymphoid, hematopoietic and related tissues: Secondary | ICD-10-CM | POA: Diagnosis not present

## 2014-07-15 DIAGNOSIS — R42 Dizziness and giddiness: Secondary | ICD-10-CM

## 2014-07-15 HISTORY — DX: Hyperlipidemia, unspecified: E78.5

## 2014-07-15 HISTORY — DX: Essential (primary) hypertension: I10

## 2014-07-15 HISTORY — DX: Dizziness and giddiness: R42

## 2014-07-15 HISTORY — DX: Anemia, unspecified: D64.9

## 2014-07-15 LAB — BASIC METABOLIC PANEL
Anion gap: 14 (ref 5–15)
Anion gap: 15 (ref 5–15)
Anion gap: 16 — ABNORMAL HIGH (ref 5–15)
Anion gap: 17 — ABNORMAL HIGH (ref 5–15)
Anion gap: 14 (ref 5–15)
BUN: 10 mg/dL (ref 6–23)
BUN: 9 mg/dL (ref 6–23)
BUN: 9 mg/dL (ref 6–23)
BUN: 8 mg/dL (ref 6–23)
BUN: 7 mg/dL (ref 6–23)
CALCIUM: 8.3 mg/dL — AB (ref 8.4–10.5)
CALCIUM: 8.3 mg/dL — AB (ref 8.4–10.5)
CALCIUM: 8.3 mg/dL — AB (ref 8.4–10.5)
CHLORIDE: 76 meq/L — AB (ref 96–112)
CO2: 22 mEq/L (ref 19–32)
CO2: 23 meq/L (ref 19–32)
CO2: 22 mEq/L (ref 19–32)
CO2: 18 mEq/L — ABNORMAL LOW (ref 19–32)
CO2: 23 mEq/L (ref 19–32)
CREATININE: 0.54 mg/dL (ref 0.50–1.10)
CREATININE: 0.49 mg/dL — AB (ref 0.50–1.10)
CREATININE: 0.45 mg/dL — AB (ref 0.50–1.10)
Calcium: 8.3 mg/dL — ABNORMAL LOW (ref 8.4–10.5)
Calcium: 8.2 mg/dL — ABNORMAL LOW (ref 8.4–10.5)
Chloride: 76 mEq/L — ABNORMAL LOW (ref 96–112)
Chloride: 76 mEq/L — ABNORMAL LOW (ref 96–112)
Chloride: 76 mEq/L — ABNORMAL LOW (ref 96–112)
Chloride: 79 mEq/L — ABNORMAL LOW (ref 96–112)
Creatinine, Ser: 0.47 mg/dL — ABNORMAL LOW (ref 0.50–1.10)
Creatinine, Ser: 0.56 mg/dL (ref 0.50–1.10)
GFR calc Af Amer: >90 mL/min (ref >90)
GFR calc non Af Amer: >90 mL/min (ref >90)
GFR calc non Af Amer: >90 mL/min (ref >90)
GLUCOSE: 164 mg/dL — AB (ref 70–99)
Glucose, Bld: 103 mg/dL — ABNORMAL HIGH (ref 70–99)
Glucose, Bld: 105 mg/dL — ABNORMAL HIGH (ref 70–99)
Glucose, Bld: 169 mg/dL — ABNORMAL HIGH (ref 70–99)
Glucose, Bld: 146 mg/dL — ABNORMAL HIGH (ref 70–99)
POTASSIUM: 3.5 meq/L — AB (ref 3.7–5.3)
POTASSIUM: 3.3 meq/L — AB (ref 3.7–5.3)
Potassium: 3.4 mEq/L — ABNORMAL LOW (ref 3.7–5.3)
Potassium: 3.5 mEq/L — ABNORMAL LOW (ref 3.7–5.3)
Potassium: 3.3 mEq/L — ABNORMAL LOW (ref 3.7–5.3)
SODIUM: 112 meq/L — AB (ref 137–147)
SODIUM: 114 meq/L — AB (ref 137–147)
Sodium: 114 mEq/L — CL (ref 137–147)
Sodium: 111 mEq/L — CL (ref 137–147)
Sodium: 116 mEq/L — CL (ref 137–147)

## 2014-07-15 LAB — PROTIME-INR
INR: 1.05 (ref 0.00–1.49)
Prothrombin Time: 13.7 seconds (ref 11.6–15.2)

## 2014-07-15 LAB — NA AND K (SODIUM & POTASSIUM), RAND UR
POTASSIUM UR: 26 meq/L
SODIUM UR: 69 meq/L

## 2014-07-15 LAB — COMPREHENSIVE METABOLIC PANEL
ALBUMIN: 4 g/dL (ref 3.5–5.2)
ALK PHOS: 81 U/L (ref 39–117)
ALT: 42 U/L — AB (ref 0–35)
ANION GAP: 16 — AB (ref 5–15)
AST: 47 U/L — ABNORMAL HIGH (ref 0–37)
BILIRUBIN TOTAL: 1.5 mg/dL — AB (ref 0.3–1.2)
BUN: 12 mg/dL (ref 6–23)
CHLORIDE: 71 meq/L — AB (ref 96–112)
CO2: 24 mEq/L (ref 19–32)
Calcium: 9.2 mg/dL (ref 8.4–10.5)
Creatinine, Ser: 0.47 mg/dL — ABNORMAL LOW (ref 0.50–1.10)
GFR calc Af Amer: >90 mL/min (ref >90)
GFR calc non Af Amer: >90 mL/min (ref >90)
Glucose, Bld: 124 mg/dL — ABNORMAL HIGH (ref 70–99)
POTASSIUM: 3.4 meq/L — AB (ref 3.7–5.3)
Sodium: 111 mEq/L — CL (ref 137–147)
Total Protein: 7.3 g/dL (ref 6.0–8.3)

## 2014-07-15 LAB — TYPE AND SCREEN
ABO/RH(D): A POS
ANTIBODY SCREEN: NEGATIVE

## 2014-07-15 LAB — TSH: TSH: 1.27 u[IU]/mL (ref 0.350–4.500)

## 2014-07-15 LAB — RETICULOCYTES
RBC.: 4.44 MIL/uL (ref 3.87–5.11)
RETIC COUNT ABSOLUTE: 71 10*3/uL (ref 19.0–186.0)
RETIC CT PCT: 1.6 % (ref 0.4–3.1)

## 2014-07-15 LAB — CHLORIDE, URINE, RANDOM: Chloride Urine: 50 meq/L

## 2014-07-15 LAB — OSMOLALITY: Osmolality: 234 mOsm/kg — ABNORMAL LOW (ref 275–300)

## 2014-07-15 LAB — APTT: aPTT: 29 seconds (ref 24–37)

## 2014-07-15 LAB — CREATININE, URINE, RANDOM: CREATININE, URINE: 31.48 mg/dL

## 2014-07-15 LAB — HEMOGLOBIN: Hemoglobin: 13.7 g/dL (ref 12.0–15.0)

## 2014-07-15 LAB — LIPASE, BLOOD: Lipase: 21 U/L (ref 11–59)

## 2014-07-15 LAB — URIC ACID: Uric Acid, Serum: 3.6 mg/dL (ref 2.4–7.0)

## 2014-07-15 LAB — MRSA PCR SCREENING: MRSA by PCR: NEGATIVE

## 2014-07-15 MED ORDER — ALUM & MAG HYDROXIDE-SIMETH 200-200-20 MG/5ML PO SUSP: 30.0000 mL | Freq: Four times a day (QID) | ORAL | Status: DC | PRN

## 2014-07-15 MED ORDER — ATORVASTATIN CALCIUM 20 MG PO TABS
20.0000 mg | ORAL_TABLET | Freq: Every day | ORAL | Status: DC
  Filled 2014-07-15 (×2): qty 1

## 2014-07-15 MED ORDER — FAMOTIDINE IN NACL 20-0.9 MG/50ML-% IV SOLN
20.0000 mg | Freq: Once | INTRAVENOUS | Status: AC
  Administered 2014-07-15: 20 mg via INTRAVENOUS
  Filled 2014-07-15: qty 50

## 2014-07-15 MED ORDER — ONDANSETRON HCL 4 MG/2ML IJ SOLN
4.0000 mg | Freq: Once | INTRAMUSCULAR | Status: AC
  Administered 2014-07-15: 4 mg via INTRAVENOUS
  Filled 2014-07-15: qty 2

## 2014-07-15 MED ORDER — SODIUM CHLORIDE 0.9 % IV BOLUS (SEPSIS)
1000.0000 mL | Freq: Once | INTRAVENOUS | Status: AC
  Administered 2014-07-15: 1000 mL via INTRAVENOUS

## 2014-07-15 MED ORDER — ACETAMINOPHEN 325 MG PO TABS
650.0000 mg | ORAL_TABLET | Freq: Four times a day (QID) | ORAL | Status: DC | PRN
  Administered 2014-07-15: 650 mg via ORAL
  Filled 2014-07-15: qty 2

## 2014-07-15 MED ORDER — MORPHINE SULFATE 2 MG/ML IJ SOLN: 2.0000 mg | INTRAMUSCULAR | Status: DC | PRN

## 2014-07-15 MED ORDER — ONDANSETRON HCL 4 MG/2ML IJ SOLN: 4.0000 mg | Freq: Four times a day (QID) | INTRAMUSCULAR | Status: DC | PRN

## 2014-07-15 MED ORDER — PANTOPRAZOLE SODIUM 40 MG IV SOLR
40.0000 mg | Freq: Every day | INTRAVENOUS | Status: DC
  Administered 2014-07-15: 40 mg via INTRAVENOUS
  Filled 2014-07-15: qty 40

## 2014-07-15 MED ORDER — MECLIZINE HCL 12.5 MG PO TABS
25.0000 mg | ORAL_TABLET | Freq: Three times a day (TID) | ORAL | Status: DC | PRN
  Administered 2014-07-17: 25 mg via ORAL
  Filled 2014-07-15: qty 2

## 2014-07-15 MED ORDER — SODIUM CHLORIDE 0.9 % IV SOLN
INTRAVENOUS | Status: DC
  Administered 2014-07-15: 12:00:00 via INTRAVENOUS

## 2014-07-15 MED ORDER — POTASSIUM CHLORIDE IN NACL 20-0.9 MEQ/L-% IV SOLN
INTRAVENOUS | Status: DC
  Administered 2014-07-15 – 2014-07-16 (×2): via INTRAVENOUS

## 2014-07-15 MED ORDER — ALBUTEROL SULFATE (2.5 MG/3ML) 0.083% IN NEBU: 2.5000 mg | INHALATION_SOLUTION | RESPIRATORY_TRACT | Status: DC | PRN

## 2014-07-15 MED ORDER — GI COCKTAIL ~~LOC~~
30.0000 mL | Freq: Once | ORAL | Status: AC
  Administered 2014-07-15: 30 mL via ORAL
  Filled 2014-07-15: qty 30

## 2014-07-15 MED ORDER — ONDANSETRON HCL 4 MG PO TABS: 4.0000 mg | ORAL_TABLET | Freq: Four times a day (QID) | ORAL | Status: DC | PRN

## 2014-07-15 MED ORDER — POTASSIUM CHLORIDE CRYS ER 20 MEQ PO TBCR
30.0000 meq | EXTENDED_RELEASE_TABLET | Freq: Once | ORAL | Status: AC
  Administered 2014-07-15: 30 meq via ORAL
  Filled 2014-07-15 (×2): qty 1

## 2014-07-15 MED ORDER — LORATADINE 10 MG PO TABS
10.0000 mg | ORAL_TABLET | Freq: Every day | ORAL | Status: DC
  Administered 2014-07-15 – 2014-07-17 (×3): 10 mg via ORAL
  Filled 2014-07-15 (×3): qty 1

## 2014-07-15 MED ORDER — SODIUM CHLORIDE 0.9 % IV SOLN: INTRAVENOUS | Status: DC

## 2014-07-15 MED ORDER — ACETAMINOPHEN 650 MG RE SUPP: 650.0000 mg | Freq: Four times a day (QID) | RECTAL | Status: DC | PRN

## 2014-07-15 MED ORDER — ALPRAZOLAM 0.5 MG PO TABS
0.5000 mg | ORAL_TABLET | Freq: Two times a day (BID) | ORAL | Status: DC | PRN
  Administered 2014-07-15 – 2014-07-16 (×3): 0.5 mg via ORAL
  Filled 2014-07-15 (×3): qty 1

## 2014-07-15 NOTE — H&P (Addendum)
[facility] History and Physical  Catriona Dillenbeck [place] WUJ:[id number] DOB: 1941-04-02 DOA: 07/15/2014  Referring physician: ED MD Dr. Lynelle Doctor PCP: Cassell Smiles., MD   Chief Complaint: Nausea, vomiting, generalized weakness, and lightheadedness.  HPI: Nicole Cobb is a 73 y.o. female with a history of hypertension, hiatal hernia, and vertigo, who presents to the emergency department from her primary care physician's office (Dr. Sherwood Gambler) after the patient was found to be lightheaded and generally weak. She presented to his office today with a 24-36 hour history of intractable nausea and vomiting. Two days ago, she  Ate 2 hot dogs with a lot of onions. The next day, she began feeling ill with nausea. Last night and all throughout the morning, she had "vomited all night long". She did not see bright red blood, but says her emesis was dark. She had no associated abdominal pain or cramping, pain with urination, or diarrhea. She denies blacked tarry stools or bright red blood per rectum. She does have GERD for which she takes omeprazole. She was told that she was too anemic to give blood approximately 4 or 5 years ago at the [facility]. She denies any history of endoscopy/colonoscopy although it was recommended.  She was also seen by Dr. Sherwood Gambler last week for routine checkup and blood work. At that time, she was told that her sodium and potassium were low and her cholesterol was high. At that time, she was started on potassium chloride supplementation and Lipitor. She also reports drinking approximately a gallon of water daily because she was told that drinking water was good for her-this was estimated per her husband also drinks about the same. She was also treated with Z-Pak and a "shot" in the office last week for "sinus congestion".  In the ED, she was afebrile and hemodynamically stable. Her lab data were significant for a serum sodium of 111, potassium of 3.4, WBC of 11.3 and hemoglobin of  7.2. Her EKG reveals sinus rhythm with nonspecific T-wave abnormalities in the lateral/anterior leads. She is being admitted for further evaluation and management.    Review of Systems:  As above in history present illness otherwise review of systems is negative.    Past Medical History  Diagnosis Date  . Hiatal hernia   . Anxiety   . Hypokalemia   . BPV (benign positional vertigo)   . Anemia   . Hyperlipidemia   . HTN (hypertension)   . Vertigo    Past Surgical History  Procedure Laterality Date  . Tubal ligation     Social History: She is married. She is retired. She has one daughter. She denies tobacco, alcohol, and illicit drug use.   No Known Allergies  Family history: Her father died of lymphoma. Her mother died of "heart problems" but she also had a history of breast cancer and diabetes.  Prior to Admission medications   Medication Sig Start Date End Date Taking? Authorizing Provider  ALPRAZolam Prudy Feeler) 0.5 MG tablet Take 1 tablet (0.5 mg total) by mouth 2 (two) times daily as needed for sleep or anxiety. 09/02/13  Yes Erick Blinks, MD  aspirin 81 MG tablet Take 81 mg by mouth daily.   Yes Historical Provider, MD  atenolol-chlorthalidone (TENORETIC) 50-25 MG per tablet Take 1 tablet by mouth daily. 06/19/14  Yes Historical Provider, MD  atorvastatin (LIPITOR) 40 MG tablet Take 1 tablet by mouth daily. 07/06/14  Yes Historical Provider, MD  bisoprolol-hydrochlorothiazide (ZIAC) 10-6.25 MG per tablet Take 1 tablet by  mouth daily.   Yes Historical Provider, MD  cetirizine (ZYRTEC) 10 MG tablet Take 10 mg by mouth daily.   Yes Historical Provider, MD  Cholecalciferol (D3-1000 PO) Take 1,000 mg by mouth daily.   Yes Historical Provider, MD  loratadine (CLARITIN) 10 MG tablet Take 10 mg by mouth daily.   Yes Historical Provider, MD  meclizine (ANTIVERT) 25 MG tablet Take 1 tablet (25 mg total) by mouth 3 (three) times daily as needed for dizziness or nausea. 09/02/13  Yes  Erick BlinksJehanzeb Memon, MD  Misc Natural Products (LUTEIN 20 PO) Take 20 mg by mouth daily.   Yes Historical Provider, MD  naproxen sodium (ANAPROX) 220 MG tablet Take 220 mg by mouth 2 (two) times daily as needed (pain).   Yes Historical Provider, MD  Omega-3 Fatty Acids (OMEGA 3 PO) Take 1 tablet by mouth daily.   Yes Historical Provider, MD  omeprazole (PRILOSEC) 20 MG capsule Take 20 mg by mouth daily.   Yes Historical Provider, MD  potassium chloride SA (K-DUR,KLOR-CON) 20 MEQ tablet Take 1 tablet by mouth daily. 07/06/14  Yes Historical Provider, MD  promethazine (PHENERGAN) 12.5 MG tablet Take 2 tablets (25 mg total) by mouth every 6 (six) hours as needed for nausea. 09/02/13  Yes Erick BlinksJehanzeb Memon, MD  vitamin B-12 (CYANOCOBALAMIN) 500 MCG tablet Take 500 mcg by mouth daily.   Yes Historical Provider, MD   Physical Exam: Filed Vitals:   07/15/14 1345 07/15/14 1400 07/15/14 1430 07/15/14 1448  BP:  136/68 133/66 133/66  Pulse:    83  Resp: 14 16  15   Height:      Weight:      SpO2:    98%    Wt Readings from Last 3 Encounters:  07/15/14 76.204 kg (168 lb)  09/01/13 72.122 kg (159 lb)    General:  Appears calm and comfortable. Surprisingly alert 73 year old Caucasian woman sitting up in bed, in no acute distress. Eyes: PERRL, normal lids, irises & conjunctiva; sclerae are white conjunctivae are clear. ENT: Oropharynx reveals mildly dry mucous membranes. No posterior exudates or erythema. Grossly normal hearing, lips & tongue Neck: no LAD, masses or thyromegaly Cardiovascular: RRR, no m/r/g. No LE edema. Telemetry: Normal sinus rhythm.  Respiratory: CTA bilaterally, no w/r/r. Normal respiratory effort. Abdomen: Obese, positive bowel sounds, soft, ntnd; no hepatosplenomegaly. No masses palpated. Skin: no rash or induration seen on limited exam (pants not taken off, but distal lower extremities without any obvious rash or lesions). Musculoskeletal: grossly normal tone BUE/BLE; no acute hot  red joints. Psychiatric: grossly normal mood and affect, speech fluent and appropriate; alert and oriented x3. Neurologic: Cranial nerves II through XII are grossly intact. Strength appears to be 5 over 5 throughout. The patient was witnessed walking from the bathroom to her room with little assistance from her husband.           Labs on Admission:  Basic Metabolic Panel:  Recent Labs Lab 07/15/14 1102 07/15/14 1400  NA 111* 112*  K 3.4* 3.5*  CL 71* 76*  CO2 24 22  GLUCOSE 124* 103*  BUN 12 10  CREATININE 0.47* 0.47*  CALCIUM 9.2 8.3*   Liver Function Tests:  Recent Labs Lab 07/15/14 1102  AST 47*  ALT 42*  ALKPHOS 81  BILITOT 1.5*  PROT 7.3  ALBUMIN 4.0    Recent Labs Lab 07/15/14 1102  LIPASE 21   No results found for this basename: AMMONIA,  in the last 168 hours CBC:  Recent  Labs Lab 07/15/14 1102 07/15/14 1309  WBC 11.3*  --   NEUTROABS 9.0*  --   HGB 7.2* 13.7  HCT 35.3*  --   MCV 79.1  --   PLT 283  --    Cardiac Enzymes: No results found for this basename: CKTOTAL, CKMB, CKMBINDEX, TROPONINI,  in the last 168 hours  BNP (last 3 results) No results found for this basename: PROBNP,  in the last 8760 hours CBG: No results found for this basename: GLUCAP,  in the last 168 hours  Radiological Exams on Admission: No results found.  EKG: Independently reviewed. As above in history of present illness  Assessment/Plan Principal Problem:   Hyponatremia Active Problems:   Nausea and vomiting   Gastroenteritis   Microcytic anemia   Elevated LFTs   Generalized weakness   Hypokalemia   1. Profound hyponatremia. Etiology is unclear, but could be secondary to ingestion of large amounts of hypotonic fluid (water). This is coupled with recent intractable vomiting overnight and this morning. She does not appear to be volume overloaded or significantly volume depleted. Will order additional laboratory studies (urine osmolality, serum osmolality,  random urine creatinine/sodium/potassium, TSH, uric acid) for further clarification. We'll also order a chest x-ray to evaluate for any type of mass/tumor. We'll also consult nephrologist Dr. Kristian Covey for further assistance. She did receive 1 L of normal saline in the ED. We will continue normal saline at 100 cc an hour. We will order every 4 hour basic metabolic panels for close monitoring. 2. Intractable nausea and vomiting, possibly secondary to acute gastroenteritis. The patient reports a temporal relationship with eating hot dogs with a lot of onions and the onset of her symptoms. Currently, she has no complaints of vomiting. She denies any recent diarrhea. We will treat her symptomatically with as needed antiemetics. She was given IV Pepcid in the ED. This will be discontinued in favor of IV Protonix. Will start clear liquid diet without advancement. 3. Mildly elevated LFTs. Given her body habitus, this could be secondary to fatty liver disease. However, she continues to have ongoing nausea and vomiting with supportive treatment, we'll consider ordering an ultrasound of her abdomen/gallbladder. Her lipase is within normal limits. 4. Hypokalemia. This is mild. She had been taking potassium chloride supplements at home. He will be repleted/supplemented in the IV fluids until it is confirmed that she has no further nausea and vomiting. 5. Microcytic anemia. The patient was told in the past that she was anemic, but she refused further workup with a colonoscopy. She denies bright tarry stools or bright red blood per rectum. She denies hematemesis. Will Hemoccult her stools and order an anemia panel. We'll consider inpatient GI evaluation if needed. Continue to monitor her CBC. 6. Hypertension. Will hold HCTZ and continue atenolol.    Code Status: Full code DVT Prophylaxis: SCDs Family Communication: Discuss with her husband Disposition Plan: Discharge to home when clinically appropriate, likely in  several days.  Time spent: One hour  [facility] Pager [phone]  **Disclaimer: This note may have been dictated with voice recognition software. Similar sounding words can inadvertently be transcribed and this note may contain transcription errors which may not have been corrected upon publication of note.**

## 2014-07-15 NOTE — Consult Note (Signed)
Reason for Consult: Hyponatremia Referring Physician: Dr. Isaac Bliss Nicole Cobb is an 73 y.o. female.  HPI: She is a patient was history of hypertension, hyperlipidemia, anemia presently came with complaints of epigastric discomfort, nausea and vomiting for the last 2 days. According to the patient she was having some epigastric discomfort and some nausea. Patient denies any diarrhea, or fever chills or sweating. Presently she denies any history of seizure no dizziness or lightheadedness. According to her she was seen by her primary care physician and he is check her sodium about a week ago she was told she has low sodium. Presently she doesn't know how low it was.  Past Medical History  Diagnosis Date  . Hiatal hernia   . Anxiety   . Hypokalemia   . BPV (benign positional vertigo)   . Anemia   . Hyperlipidemia   . HTN (hypertension)   . Vertigo     Past Surgical History  Procedure Laterality Date  . Tubal ligation      History reviewed. No pertinent family history.  Social History:  reports that she has never smoked. She does not have any smokeless tobacco history on file. She reports that she does not drink alcohol. Her drug history is not on file.  Allergies: No Known Allergies  Medications: I have reviewed the patient's current medications.  Results for orders placed during the hospital encounter of 07/15/14 (from the past 48 hour(s))  CBC WITH DIFFERENTIAL     Status: Abnormal   Collection Time    07/15/14 11:02 AM      Result Value Ref Range   WBC 11.3 (*) 4.0 - 10.5 K/uL   RBC 4.46  3.87 - 5.11 MIL/uL   Hemoglobin 7.2 (*) 12.0 - 15.0 g/dL   Comment: CORRECTED FOR ICTERUS   HCT 35.3 (*) 36.0 - 46.0 %   MCV 79.1  78.0 - 100.0 fL   MCH 16.1 (*) 26.0 - 34.0 pg   MCHC 20.4 (*) 30.0 - 36.0 g/dL   Comment: SPECIMEN CHECKED FOR CLOTS     CORRECTED FOR ICTERUS   RDW 12.4  11.5 - 15.5 %   Platelets 283  150 - 400 K/uL   Neutrophils Relative % 80 (*) 43 - 77 %   Neutro  Abs 9.0 (*) 1.7 - 7.7 K/uL   Lymphocytes Relative 13  12 - 46 %   Lymphs Abs 1.5  0.7 - 4.0 K/uL   Monocytes Relative 7  3 - 12 %   Monocytes Absolute 0.8  0.1 - 1.0 K/uL   Eosinophils Relative 0  0 - 5 %   Eosinophils Absolute 0.0  0.0 - 0.7 K/uL   Basophils Relative 0  0 - 1 %   Basophils Absolute 0.0  0.0 - 0.1 K/uL  COMPREHENSIVE METABOLIC PANEL     Status: Abnormal   Collection Time    07/15/14 11:02 AM      Result Value Ref Range   Sodium 111 (*) 137 - 147 mEq/L   Comment: CRITICAL RESULT CALLED TO, READ BACK BY AND VERIFIED WITH:     B.MINTER AT 1150 ON 07/15/14 BY S.VANHOORNE   Potassium 3.4 (*) 3.7 - 5.3 mEq/L   Chloride 71 (*) 96 - 112 mEq/L   CO2 24  19 - 32 mEq/L   Glucose, Bld 124 (*) 70 - 99 mg/dL   BUN 12  6 - 23 mg/dL   Creatinine, Ser 0.47 (*) 0.50 - 1.10 mg/dL   Calcium  9.2  8.4 - 10.5 mg/dL   Total Protein 7.3  6.0 - 8.3 g/dL   Albumin 4.0  3.5 - 5.2 g/dL   AST 47 (*) 0 - 37 U/L   ALT 42 (*) 0 - 35 U/L   Alkaline Phosphatase 81  39 - 117 U/L   Total Bilirubin 1.5 (*) 0.3 - 1.2 mg/dL   GFR calc non Af Amer >90  >90 mL/min   GFR calc Af Amer >90  >90 mL/min   Comment: (NOTE)     The eGFR has been calculated using the CKD EPI equation.     This calculation has not been validated in all clinical situations.     eGFR's persistently <90 mL/min signify possible Chronic Kidney     Disease.   Anion gap 16 (*) 5 - 15  LIPASE, BLOOD     Status: None   Collection Time    07/15/14 11:02 AM      Result Value Ref Range   Lipase 21  11 - 59 U/L  HEMOGLOBIN     Status: None   Collection Time    07/15/14  1:09 PM      Result Value Ref Range   Hemoglobin 13.7  12.0 - 15.0 g/dL   Comment: DELTA CHECK NOTED     RECOLLECTED SPECIMEN  BASIC METABOLIC PANEL     Status: Abnormal   Collection Time    07/15/14  2:00 PM      Result Value Ref Range   Sodium 112 (*) 137 - 147 mEq/L   Comment: CRITICAL RESULT CALLED TO, READ BACK BY AND VERIFIED WITH:     G.PRUITT AT 1508 ON  07/15/14 BY S.VANHOORNE   Potassium 3.5 (*) 3.7 - 5.3 mEq/L   Chloride 76 (*) 96 - 112 mEq/L   CO2 22  19 - 32 mEq/L   Glucose, Bld 103 (*) 70 - 99 mg/dL   BUN 10  6 - 23 mg/dL   Creatinine, Ser 0.47 (*) 0.50 - 1.10 mg/dL   Calcium 8.3 (*) 8.4 - 10.5 mg/dL   GFR calc non Af Amer >90  >90 mL/min   GFR calc Af Amer >90  >90 mL/min   Comment: (NOTE)     The eGFR has been calculated using the CKD EPI equation.     This calculation has not been validated in all clinical situations.     eGFR's persistently <90 mL/min signify possible Chronic Kidney     Disease.   Anion gap 14  5 - 15  BASIC METABOLIC PANEL     Status: Abnormal   Collection Time    07/15/14  4:00 PM      Result Value Ref Range   Sodium 114 (*) 137 - 147 mEq/L   Comment: CRITICAL RESULT CALLED TO, READ BACK BY AND VERIFIED WITH:     S.HEAPH AT 1652 ON 07/15/14 BY S.VANHOORNE   Potassium 3.4 (*) 3.7 - 5.3 mEq/L   Chloride 76 (*) 96 - 112 mEq/L   CO2 23  19 - 32 mEq/L   Glucose, Bld 105 (*) 70 - 99 mg/dL   BUN 9  6 - 23 mg/dL   Creatinine, Ser 0.54  0.50 - 1.10 mg/dL   Calcium 8.3 (*) 8.4 - 10.5 mg/dL   GFR calc non Af Amer >90  >90 mL/min   GFR calc Af Amer >90  >90 mL/min   Comment: (NOTE)     The eGFR has been calculated using the  CKD EPI equation.     This calculation has not been validated in all clinical situations.     eGFR's persistently <90 mL/min signify possible Chronic Kidney     Disease.   Anion gap 15  5 - 15  APTT     Status: None   Collection Time    07/15/14  4:00 PM      Result Value Ref Range   aPTT 29  24 - 37 seconds  PROTIME-INR     Status: None   Collection Time    07/15/14  4:00 PM      Result Value Ref Range   Prothrombin Time 13.7  11.6 - 15.2 seconds   INR 1.05  0.00 - 1.49  URIC ACID     Status: None   Collection Time    07/15/14  4:00 PM      Result Value Ref Range   Uric Acid, Serum 3.6  2.4 - 7.0 mg/dL  RETICULOCYTES     Status: None   Collection Time    07/15/14  4:00 PM       Result Value Ref Range   Retic Ct Pct 1.6  0.4 - 3.1 %   RBC. 4.44  3.87 - 5.11 MIL/uL   Retic Count, Manual 71.0  19.0 - 186.0 K/uL  TYPE AND SCREEN     Status: None   Collection Time    07/15/14  4:00 PM      Result Value Ref Range   ABO/RH(D) A POS     Antibody Screen NEG     Sample Expiration 07/18/2014      Portable Chest 1 View  07/15/2014   CLINICAL DATA:  Weakness, nausea and vomiting, hyponatremia.  EXAM: PORTABLE CHEST - 1 VIEW  COMPARISON:  None.  FINDINGS: Upper limits normal heart size noted.  Mild peribronchial thickening is identified of uncertain chronicity.  Mild elevation of the right hemidiaphragm is present.  A subsegmental scar in the left lung base is noted.  There is no evidence of focal airspace disease, pulmonary edema, suspicious pulmonary nodule/mass, pleural effusion, or pneumothorax. No acute bony abnormalities are identified.  IMPRESSION: Mild peribronchial thickening - suspect chronic.  No other acute abnormalities identified.  Mild elevation of the right hemidiaphragm and left basilar subsegmental scar.   Electronically Signed   By: Hassan Rowan M.D.   On: 07/15/2014 16:41    Review of Systems  Constitutional: Negative for fever and chills.  Respiratory: Negative for shortness of breath.   Cardiovascular: Negative for leg swelling.  Gastrointestinal: Positive for heartburn, nausea and vomiting. Negative for diarrhea and blood in stool.  Neurological: Negative for dizziness.   Blood pressure 133/66, pulse 83, resp. rate 15, height _0  (1.626 m), weight 76.9 kg (169 lb 8.5 oz), SpO2 98.00%. Physical Exam  Constitutional: She is oriented to person, place, and time. No distress.  Eyes: No scleral icterus.  Neck: No JVD present.  Cardiovascular: Normal rate and regular rhythm.   Respiratory: She has no wheezes. She has no rales.  GI: She exhibits no distension. There is no tenderness.  Musculoskeletal: She exhibits no edema.  Neurological: She is alert and  oriented to person, place, and time.    Assessment/Plan: Problem #1 hyponatremia: As this moment seems to be hypovolemic hyponatremia secondary to volume depletion with hypotonic fluid replacement. Since patient has also see their nausea possibly that contribute to hyponatremia. Patient does not remember whether she is on hydrochlorothiazide her she states that  she is on a medication for her blood pressure which is made up of 2 medications. Problem #2 nausea and vomiting: Presently slightly better patient restart liquid diet. Problem #3 hypertension: Her blood pressure slightly high but stable Problem #4 anemia Problem #5 history of hypokalemia: Most likely from nausea and vomiting. Problem #6 mild elevation of LFTs Problem #7 hypercholesterolemia Plan: We'll continue with normal saline with 20 mEq of KCl at 135 cc per hour We'll check her basic metabolic panel every 2 hours. If her sodium increased to 1-2 mEq we'll keep at the same rate until her sodium is about 118-120 And possibly changed to normal saline and replace her potassium orally. We'll check her urine sodium, osmolality, urine potassium, urine chloride, serum osmolality and uric acid.  Madeleine Fenn S 07/15/2014, 5:30 PM

## 2014-07-15 NOTE — ED Notes (Signed)
CRITICAL VALUE ALERT  Critical value received: Sodium  Date of notification: 07/15/14   Time of notification:  1507  Critical value read back:Yes.    Nurse who received alert:  Delila PereyraGinger Lyzbeth Genrich  MD notified (1st page): Lynelle DoctorKnapp @ [phone]       Responding MD: Lynelle DoctorKnapp

## 2014-07-15 NOTE — Progress Notes (Signed)
CRITICAL VALUE ALERT  Critical value received:  NA 114  Date of notification:  07/15/14  Time of notification:  1650  Critical value read back:Yes.    Nurse who received alert:  Sammuel BailiffSarah Heath, RN   MD notified (1st page):  Dr. Sherrie MustacheFisher  Time of first page:  1651  MD notified (2nd page):  Time of second page:  Responding MD:  Dr. Sherrie MustacheFisher  Time MD responded:  [phone]

## 2014-07-15 NOTE — ED Notes (Signed)
CRITICAL VALUE ALERT  Critical value received:  Sodium 111  Date of notification:  07/15/14  Time of notification:  1149  Critical value read back:Yes.    Nurse who received alert:  Rminter  MD notified (1st page):  1149  Time of first page:    MD notified (2nd page):  Time of second page:  Responding MD:  Dr. Lynelle DoctorKnapp  Time MD responded:  1149

## 2014-07-15 NOTE — ED Provider Notes (Signed)
CSN: [id number]     Arrival date & time 07/15/14  1191 History  This chart was scribed for Ward Givens, MD by [place], ED Scribe. The patient was seen in [facility]. The patient's care was started at 9:52 AM.    Chief Complaint  Patient presents with  . Emesis      Patient is a 73 y.o. female presenting with vomiting. The history is provided by the patient. No language interpreter was used.  Emesis Associated symptoms: no abdominal pain and no diarrhea    HPI Comments: HADLEI STITT is a 73 y.o. female who presents to the Emergency Department complaining nausea with burning sensation in her throat onset yesterday and associated emesis. Pt states she vomited 4 or 5 times last night and once this morning pta.  She reports that she ate two hot dogs with onions shortly before emesis episodes began. Pt states she went to see her PCP Dr. Sherwood Gambler last week and was found to have a low potassium and high cholesterol. She was started on Lipitor. She was seen today by her PCP for her vomiting and and was referred to come here. Pt's husband also states that she doesn't seem too stable on her feet when she walks today. She denies feeling light headed or dizzy and states she is unsteady b/o being nauseated and weak. Pt reports that she is currently taking Prilosec for GERD. She denies any fever, abdominal pain, diarrhea or being around anyone that's sick. She states she doesn't normally eat hot dogs and onions.   PCP Dr Sherwood Gambler  Past Medical History  Diagnosis Date  . Hiatal hernia   . Anxiety   . Hypokalemia   . BPV (benign positional vertigo)   . Anemia   . Hyperlipidemia   . HTN (hypertension)   . Vertigo    Past Surgical History  Procedure Laterality Date  . Tubal ligation     History reviewed. No pertinent family history. History  Substance Use Topics  . Smoking status: Never Smoker   . Smokeless tobacco: Not on file  . Alcohol Use: No   Lives at home Lives with spouse   OB  History   Grav Para Term Preterm Abortions TAB SAB Ect Mult Living                 Review of Systems  Constitutional: Negative for fever.  Gastrointestinal: Positive for nausea and vomiting. Negative for abdominal pain and diarrhea.  All other systems reviewed and are negative.     Allergies  Review of patient's allergies indicates no known allergies.  Home Medications   Prior to Admission medications   Medication Sig Start Date End Date Taking? Authorizing Provider  ALPRAZolam Prudy Feeler) 0.5 MG tablet Take 1 tablet (0.5 mg total) by mouth 2 (two) times daily as needed for sleep or anxiety. 09/02/13   Erick Blinks, MD  aspirin 81 MG tablet Take 81 mg by mouth daily.    Historical Provider, MD  bisoprolol-hydrochlorothiazide [facility]) 10-6.25 MG per tablet Take 1 tablet by mouth daily.    Historical Provider, MD  Cholecalciferol (D3-1000 PO) Take 1,000 mg by mouth daily.    Historical Provider, MD  loratadine (CLARITIN) 10 MG tablet Take 10 mg by mouth daily.    Historical Provider, MD  meclizine (ANTIVERT) 25 MG tablet Take 1 tablet (25 mg total) by mouth 3 (three) times daily as needed for dizziness or nausea. 09/02/13   Erick Blinks, MD  Misc Natural Products (  LUTEIN 20 PO) Take 20 mg by mouth daily.    Historical Provider, MD  naproxen sodium (ANAPROX) 220 MG tablet Take 220 mg by mouth 2 (two) times daily as needed (pain).    Historical Provider, MD  Omega-3 Fatty Acids (OMEGA 3 PO) Take 1 tablet by mouth daily.    Historical Provider, MD  omeprazole (PRILOSEC) 20 MG capsule Take 20 mg by mouth daily.    Historical Provider, MD  promethazine (PHENERGAN) 12.5 MG tablet Take 2 tablets (25 mg total) by mouth every 6 (six) hours as needed for nausea. 09/02/13   Erick Blinks, MD  scopolamine (TRANSDERM-SCOP) 1.5 MG Place 1 patch (1.5 mg total) onto the skin every 3 (three) days. 09/02/13   Erick Blinks, MD  vitamin B-12 (CYANOCOBALAMIN) 500 MCG tablet Take 500 mcg by mouth daily.     Historical Provider, MD   BP 176/95  Pulse 75  Ht 5\' 4"  (1.626 m)  Wt 168 lb (76.204 kg)  BMI 28.82 kg/m2  SpO2 96%  Vital signs normal   Physical Exam  Nursing note and vitals reviewed. Constitutional: She is oriented to person, place, and time. She appears well-developed and well-nourished.  Non-toxic appearance. She does not appear ill. No distress.  HENT:  Head: Normocephalic and atraumatic.  Right Ear: External ear normal.  Left Ear: External ear normal.  Nose: Nose normal. No mucosal edema or rhinorrhea.  Mouth/Throat: Mucous membranes are normal. No dental abscesses or uvula swelling.  Dry tongue.   Eyes: Conjunctivae and EOM are normal. Pupils are equal, round, and reactive to light.  Neck: Normal range of motion and full passive range of motion without pain. Neck supple.  Cardiovascular: Normal rate, regular rhythm and normal heart sounds.  Exam reveals no gallop and no friction rub.   No murmur heard. Pulmonary/Chest: Effort normal and breath sounds normal. No respiratory distress. She has no wheezes. She has no rhonchi. She has no rales. She exhibits no tenderness and no crepitus.  Abdominal: Soft. Normal appearance and bowel sounds are normal. There is no tenderness.  Musculoskeletal: Normal range of motion. She exhibits no edema and no tenderness.  Moves all extremities well.   Neurological: She is alert and oriented to person, place, and time. She has normal strength. No cranial nerve deficit.  Skin: Skin is warm, dry and intact. There is pallor.  Psychiatric: She has a normal mood and affect. Her speech is normal and behavior is normal. Her mood appears not anxious.    ED Course  Procedures (including critical care time)  Medications  atorvastatin (LIPITOR) tablet 20 mg (not administered)  ALPRAZolam (XANAX) tablet 0.5 mg (not administered)  loratadine (CLARITIN) tablet 10 mg (10 mg Oral Given 07/15/14 1654)  meclizine (ANTIVERT) tablet 25 mg (not administered)   0.9 % NaCl with KCl 20 mEq/ L  infusion ( Intravenous New Bag/Given 07/15/14 1656)  acetaminophen (TYLENOL) tablet 650 mg (not administered)    Or  acetaminophen (TYLENOL) suppository 650 mg (not administered)  morphine 2 MG/ML injection 2 mg (not administered)  ondansetron (ZOFRAN) tablet 4 mg (not administered)    Or  ondansetron (ZOFRAN) injection 4 mg (not administered)  albuterol (PROVENTIL) (2.5 MG/3ML) 0.083% nebulizer solution 2.5 mg (not administered)  alum & mag hydroxide-simeth (MAALOX/MYLANTA) 200-200-20 MG/5ML suspension 30 mL (not administered)  pantoprazole (PROTONIX) injection 40 mg (not administered)  famotidine (PEPCID) IVPB 20 mg (0 mg Intravenous Stopped 07/15/14 1130)  ondansetron (ZOFRAN) injection 4 mg (4 mg Intravenous Given 07/15/14 1040)  sodium chloride 0.9 % bolus 1,000 mL (0 mLs Intravenous Stopped 07/15/14 1205)  ondansetron (ZOFRAN) injection 4 mg (4 mg Intravenous Given 07/15/14 1159)  gi cocktail (Maalox,Lidocaine,Donnatal) (30 mLs Oral Given 07/15/14 1158)      DIAGNOSTIC STUDIES: Oxygen Saturation is 96% on room air, adequate by my interpretation.       COORDINATION OF CARE: 9:57 AM- Treatment plan was discussed with patient who verbalizes understanding and agrees.   11:30 AM- Recheck, Pt states nausea has improved slightly but returns when she sits up. Burning sensation is still present. More meds ordered.   1:11 PM- Discuss lab test results with result and low levels of sodium. She says that when she saw her PCP 10 days ago and was told she was anemic but does not know exact reading. She also had a low potassium and was placed on potassium pills.  Pt is going to be admitted into the hospital. She denies any rectal bleeding, black or bloody stools.   I spoke to the lab, they state her blood in the purple tubes (CBC) is an Electrical engineeramber color. They state her blood is not lipemic.   14:21 Dr Sherrie MustacheFisher, wants the BMET to be repeated in addition to the CBC, but is  planning on admitting to ICU  15:20 Dr Sherrie MustacheFisher updated on her repeat sodium. Her repeat Hb is still pending. She is going to admit.    Labs Review Results for orders placed during the hospital encounter of 07/15/14  CBC WITH DIFFERENTIAL      Result Value Ref Range   WBC 11.3 (*) 4.0 - 10.5 K/uL   RBC 4.46  3.87 - 5.11 MIL/uL   Hemoglobin 7.2 (*) 12.0 - 15.0 g/dL   HCT 16.135.3 (*) 09.636.0 - 04.546.0 %   MCV 79.1  78.0 - 100.0 fL   MCH 16.1 (*) 26.0 - 34.0 pg   MCHC 20.4 (*) 30.0 - 36.0 g/dL   RDW 40.912.4  81.111.5 - 91.415.5 %   Platelets 283  150 - 400 K/uL   Neutrophils Relative % 80 (*) 43 - 77 %   Neutro Abs 9.0 (*) 1.7 - 7.7 K/uL   Lymphocytes Relative 13  12 - 46 %   Lymphs Abs 1.5  0.7 - 4.0 K/uL   Monocytes Relative 7  3 - 12 %   Monocytes Absolute 0.8  0.1 - 1.0 K/uL   Eosinophils Relative 0  0 - 5 %   Eosinophils Absolute 0.0  0.0 - 0.7 K/uL   Basophils Relative 0  0 - 1 %   Basophils Absolute 0.0  0.0 - 0.1 K/uL  COMPREHENSIVE METABOLIC PANEL      Result Value Ref Range   Sodium 111 (*) 137 - 147 mEq/L   Potassium 3.4 (*) 3.7 - 5.3 mEq/L   Chloride 71 (*) 96 - 112 mEq/L   CO2 24  19 - 32 mEq/L   Glucose, Bld 124 (*) 70 - 99 mg/dL   BUN 12  6 - 23 mg/dL   Creatinine, Ser 7.820.47 (*) 0.50 - 1.10 mg/dL   Calcium 9.2  8.4 - 95.610.5 mg/dL   Total Protein 7.3  6.0 - 8.3 g/dL   Albumin 4.0  3.5 - 5.2 g/dL   AST 47 (*) 0 - 37 U/L   ALT 42 (*) 0 - 35 U/L   Alkaline Phosphatase 81  39 - 117 U/L   Total Bilirubin 1.5 (*) 0.3 - 1.2 mg/dL   GFR calc non Af  Amer >90  >90 mL/min   GFR calc Af Amer >90  >90 mL/min   Anion gap 16 (*) 5 - 15  LIPASE, BLOOD      Result Value Ref Range   Lipase 21  11 - 59 U/L  HEMOGLOBIN      Result Value Ref Range   Hemoglobin 13.7  12.0 - 15.0 g/dL  BASIC METABOLIC PANEL      Result Value Ref Range   Sodium 112 (*) 137 - 147 mEq/L   Potassium 3.5 (*) 3.7 - 5.3 mEq/L   Chloride 76 (*) 96 - 112 mEq/L   CO2 22  19 - 32 mEq/L   Glucose, Bld 103 (*) 70 - 99 mg/dL    BUN 10  6 - 23 mg/dL   Creatinine, Ser 1.61 (*) 0.50 - 1.10 mg/dL   Calcium 8.3 (*) 8.4 - 10.5 mg/dL   GFR calc non Af Amer >90  >90 mL/min   GFR calc Af Amer >90  >90 mL/min   Anion gap 14  5 - 15  BASIC METABOLIC PANEL      Result Value Ref Range   Sodium 114 (*) 137 - 147 mEq/L   Potassium 3.4 (*) 3.7 - 5.3 mEq/L   Chloride 76 (*) 96 - 112 mEq/L   CO2 23  19 - 32 mEq/L   Glucose, Bld 105 (*) 70 - 99 mg/dL   BUN 9  6 - 23 mg/dL   Creatinine, Ser 0.96  0.50 - 1.10 mg/dL   Calcium 8.3 (*) 8.4 - 10.5 mg/dL   GFR calc non Af Amer >90  >90 mL/min   GFR calc Af Amer >90  >90 mL/min   Anion gap 15  5 - 15  APTT      Result Value Ref Range   aPTT 29  24 - 37 seconds  PROTIME-INR      Result Value Ref Range   Prothrombin Time 13.7  11.6 - 15.2 seconds   INR 1.05  0.00 - 1.49  URIC ACID      Result Value Ref Range   Uric Acid, Serum 3.6  2.4 - 7.0 mg/dL  RETICULOCYTES      Result Value Ref Range   Retic Ct Pct 1.6  0.4 - 3.1 %   RBC. 4.44  3.87 - 5.11 MIL/uL   Retic Count, Manual 71.0  19.0 - 186.0 K/uL  TYPE AND SCREEN      Result Value Ref Range   ABO/RH(D) A POS     Antibody Screen PENDING     Sample Expiration 07/18/2014    ] Hemoccult reported as negative  Laboratory interpretation all normal except hyponatremia, anemia     Imaging Review Portable Chest 1 View  07/15/2014   CLINICAL DATA:  Weakness, nausea and vomiting, hyponatremia.  EXAM: PORTABLE CHEST - 1 VIEW  COMPARISON:  None.  FINDINGS: Upper limits normal heart size noted.  Mild peribronchial thickening is identified of uncertain chronicity.  Mild elevation of the right hemidiaphragm is present.  A subsegmental scar in the left lung base is noted.  There is no evidence of focal airspace disease, pulmonary edema, suspicious pulmonary nodule/mass, pleural effusion, or pneumothorax. No acute bony abnormalities are identified.  IMPRESSION: Mild peribronchial thickening - suspect chronic.  No other acute  abnormalities identified.  Mild elevation of the right hemidiaphragm and left basilar subsegmental scar.   Electronically Signed   By: Laveda Abbe M.D.   On: 07/15/2014 16:41  EKG Interpretation   Date/Time:  Saturday July 15 2014 10:13:26 EDT Ventricular Rate:  67 PR Interval:  174 QRS Duration: 80 QT Interval:  443 QTC Calculation: 468 R Axis:   31 Text Interpretation:  Sinus rhythm Since last tracing 01 Sep 2013  Borderline T abnormalities, anterior leads Confirmed by Anacaren Kohan  MD-I, Rubi Tooley  ([id number]) on 07/15/2014 3:24:04 PM       MDM   Final diagnoses:  Hyponatremia  Nausea and vomiting, vomiting of unspecified type  Anemia, unspecified  Gastroesophageal reflux disease without esophagitis   Plan admission    CRITICAL CARE Performed by: Devoria Albe L Total critical care time: 35 min Critical care time was exclusive of separately billable procedures and treating other patients. Critical care was necessary to treat or prevent imminent or life-threatening deterioration. Critical care was time spent personally by me on the following activities: development of treatment plan with patient and/or surrogate as well as nursing, discussions with consultants, evaluation of patient's response to treatment, examination of patient, obtaining history from patient or surrogate, ordering and performing treatments and interventions, ordering and review of laboratory studies, ordering and review of radiographic studies, pulse oximetry and re-evaluation of patient's condition.   I personally performed the services described in this documentation, which was scribed in my presence. The recorded information has been reviewed and considered.  Devoria Albe, MD, FACEP    Ward Givens, MD 07/15/14 [phone]

## 2014-07-15 NOTE — ED Notes (Signed)
Hiccups started Thursday and had vomiting started at 4 pm yesterday.

## 2014-07-15 NOTE — Progress Notes (Signed)
Critical sodium of 116 called by lab.  MD and nephrology notified via text page.

## 2014-07-15 NOTE — Progress Notes (Signed)
CRITICAL VALUE ALERT  Critical value received:  NA 114  Date of notification:  07/15/14  Time of notification:  1835  Critical value read back:Yes.    Nurse who received alert:  Sammuel BailiffSarah Heath, RN   MD notified (1st page):  Dr. Sherrie MustacheFIsher  Time of first page:  1837  MD notified (2nd page):  Time of second page:  Responding MD:    Time MD responded:

## 2014-07-15 NOTE — Progress Notes (Signed)
Critical sodium of 111 reported to midlevel and to nephrology at this time  Awaiting orders.

## 2014-07-15 NOTE — Progress Notes (Signed)
Critical serum osmolality of 234. MD notified via text page.

## 2014-07-16 DIAGNOSIS — I10 Essential (primary) hypertension: Secondary | ICD-10-CM | POA: Diagnosis present

## 2014-07-16 DIAGNOSIS — E876 Hypokalemia: Secondary | ICD-10-CM

## 2014-07-16 LAB — BASIC METABOLIC PANEL
ANION GAP: 12 (ref 5–15)
Anion gap: 11 (ref 5–15)
Anion gap: 11 (ref 5–15)
Anion gap: 11 (ref 5–15)
Anion gap: 12 (ref 5–15)
BUN: 6 mg/dL (ref 6–23)
BUN: 6 mg/dL (ref 6–23)
BUN: 6 mg/dL (ref 6–23)
BUN: 7 mg/dL (ref 6–23)
BUN: 7 mg/dL (ref 6–23)
CALCIUM: 8.2 mg/dL — AB (ref 8.4–10.5)
CHLORIDE: 80 meq/L — AB (ref 96–112)
CHLORIDE: 83 meq/L — AB (ref 96–112)
CHLORIDE: 84 meq/L — AB (ref 96–112)
CO2: 21 mEq/L (ref 19–32)
CO2: 23 mEq/L (ref 19–32)
CO2: 23 meq/L (ref 19–32)
CO2: 24 mEq/L (ref 19–32)
CO2: 25 mEq/L (ref 19–32)
CREATININE: 0.55 mg/dL (ref 0.50–1.10)
Calcium: 8 mg/dL — ABNORMAL LOW (ref 8.4–10.5)
Calcium: 8.1 mg/dL — ABNORMAL LOW (ref 8.4–10.5)
Calcium: 8.1 mg/dL — ABNORMAL LOW (ref 8.4–10.5)
Calcium: 8.6 mg/dL (ref 8.4–10.5)
Chloride: 86 mEq/L — ABNORMAL LOW (ref 96–112)
Chloride: 89 mEq/L — ABNORMAL LOW (ref 96–112)
Creatinine, Ser: 0.55 mg/dL (ref 0.50–1.10)
Creatinine, Ser: 0.56 mg/dL (ref 0.50–1.10)
Creatinine, Ser: 0.61 mg/dL (ref 0.50–1.10)
Creatinine, Ser: 0.61 mg/dL (ref 0.50–1.10)
GFR calc Af Amer: >90 mL/min (ref >90)
GFR calc Af Amer: >90 mL/min (ref >90)
GFR calc Af Amer: >90 mL/min (ref >90)
GFR calc Af Amer: >90 mL/min (ref >90)
GFR calc non Af Amer: >90 mL/min (ref >90)
GFR calc non Af Amer: >90 mL/min (ref >90)
GFR calc non Af Amer: 89 mL/min — ABNORMAL LOW (ref >90)
GFR, EST NON AFRICAN AMERICAN: 89 mL/min — AB (ref >90)
GLUCOSE: 83 mg/dL (ref 70–99)
GLUCOSE: 91 mg/dL (ref 70–99)
GLUCOSE: 92 mg/dL (ref 70–99)
Glucose, Bld: 100 mg/dL — ABNORMAL HIGH (ref 70–99)
Glucose, Bld: 118 mg/dL — ABNORMAL HIGH (ref 70–99)
POTASSIUM: 3.8 meq/L (ref 3.7–5.3)
POTASSIUM: 3.9 meq/L (ref 3.7–5.3)
Potassium: 3.4 mEq/L — ABNORMAL LOW (ref 3.7–5.3)
Potassium: 4.3 mEq/L (ref 3.7–5.3)
Potassium: 4.4 mEq/L (ref 3.7–5.3)
SODIUM: 121 meq/L — AB (ref 137–147)
SODIUM: 122 meq/L — AB (ref 137–147)
Sodium: 116 mEq/L — CL (ref 137–147)
Sodium: 118 mEq/L — CL (ref 137–147)
Sodium: 118 mEq/L — CL (ref 137–147)

## 2014-07-16 LAB — CBC
HCT: 33 % — ABNORMAL LOW (ref 36.0–46.0)
HEMOGLOBIN: 12.6 g/dL (ref 12.0–15.0)
MCH: 31.5 pg (ref 26.0–34.0)
MCHC: 38.2 g/dL — ABNORMAL HIGH (ref 30.0–36.0)
MCV: 82.5 fL (ref 78.0–100.0)
Platelets: 237 10*3/uL (ref 150–400)
RBC: 4 MIL/uL (ref 3.87–5.11)
RDW: 12.4 % (ref 11.5–15.5)
WBC: 8.1 10*3/uL (ref 4.0–10.5)

## 2014-07-16 LAB — FERRITIN: Ferritin: 269 ng/mL (ref 10–291)

## 2014-07-16 LAB — HEPATIC FUNCTION PANEL
ALBUMIN: 3.2 g/dL — AB (ref 3.5–5.2)
ALT: 32 U/L (ref 0–35)
AST: 37 U/L (ref 0–37)
Alkaline Phosphatase: 66 U/L (ref 39–117)
Bilirubin, Direct: 0.2 mg/dL (ref 0.0–0.3)
Indirect Bilirubin: 0.9 mg/dL (ref 0.3–0.9)
Total Bilirubin: 1.1 mg/dL (ref 0.3–1.2)
Total Protein: 5.9 g/dL — ABNORMAL LOW (ref 6.0–8.3)

## 2014-07-16 LAB — IRON AND TIBC
Iron: 80 ug/dL (ref 42–135)
Saturation Ratios: 26 % (ref 20–55)
TIBC: 312 ug/dL (ref 250–470)
UIBC: 232 ug/dL (ref 125–400)

## 2014-07-16 LAB — FOLATE: FOLATE: 18.4 ng/mL

## 2014-07-16 LAB — OSMOLALITY, URINE: Osmolality, Ur: 302 mOsm/kg — ABNORMAL LOW (ref 390–1090)

## 2014-07-16 LAB — VITAMIN B12

## 2014-07-16 MED ORDER — ATORVASTATIN CALCIUM 20 MG PO TABS
20.0000 mg | ORAL_TABLET | Freq: Every day | ORAL | Status: DC
  Administered 2014-07-16: 20 mg via ORAL
  Filled 2014-07-16: qty 1

## 2014-07-16 MED ORDER — ATENOLOL 25 MG PO TABS
50.0000 mg | ORAL_TABLET | Freq: Every day | ORAL | Status: DC
  Administered 2014-07-16 – 2014-07-17 (×2): 50 mg via ORAL
  Filled 2014-07-16 (×2): qty 2

## 2014-07-16 MED ORDER — SODIUM CHLORIDE 0.9 % IV SOLN
INTRAVENOUS | Status: DC
  Administered 2014-07-16 (×2): via INTRAVENOUS

## 2014-07-16 MED ORDER — SENNOSIDES-DOCUSATE SODIUM 8.6-50 MG PO TABS
1.0000 | ORAL_TABLET | Freq: Two times a day (BID) | ORAL | Status: DC
  Administered 2014-07-16 – 2014-07-17 (×3): 1 via ORAL
  Filled 2014-07-16 (×3): qty 1

## 2014-07-16 MED ORDER — PANTOPRAZOLE SODIUM 40 MG PO TBEC
40.0000 mg | DELAYED_RELEASE_TABLET | Freq: Every day | ORAL | Status: DC
  Administered 2014-07-16 – 2014-07-17 (×2): 40 mg via ORAL
  Filled 2014-07-16 (×2): qty 1

## 2014-07-16 NOTE — Progress Notes (Signed)
Report given to J. Holt,RN. Patient transferred to 332 in stable condition with NT via wheelchair.

## 2014-07-16 NOTE — Progress Notes (Signed)
Critical sodium called by lab of 118. Value improving

## 2014-07-16 NOTE — Progress Notes (Signed)
[facility] PROGRESS NOTE  Nicole Cobb WJX:[id number] DOB: 1941/09/20 DOA: 07/15/2014 PCP: Cassell Smiles., MD   Code Status: Full code Family Communication: Discussed with husband Disposition Plan: Anticipate discharge to home in the next 24-48 hours   Consultants:  Nephrology  Procedures:  None  Antibiotics:  None  HPI/Subjective: The patient feels better. She has no complaints of nausea, vomiting, or abdominal pain. She denies headache or dizziness.  Objective: Filed Vitals:   07/16/14 0800  BP: 127/60  Pulse: 65  Temp: 98.5 F (36.9 C)  Resp: 13    Intake/Output Summary (Last 24 hours) at 07/16/14 0913 Last data filed at 07/16/14 0800  Gross per 24 hour  Intake 2732.33 ml  Output   3000 ml  Net -267.67 ml   Filed Weights   07/15/14 0932 07/15/14 1636  Weight: 76.204 kg (168 lb) 76.9 kg (169 lb 8.5 oz)    Exam:   General:  Pleasant 73 year old woman sitting up in bed, in no acute distress.  Cardiovascular: S1, S2, with a soft systolic murmur.  Respiratory: Clear to auscultation bilaterally.  Abdomen: Positive bowel sounds, mildly obese, nontender, nondistended.  Musculoskeletal/extremities: No pedal edema. No acute hot red joints.  Neurologic: She is alert and oriented x3. Cranial nerves II through XII are intact.   Data Reviewed: Basic Metabolic Panel:  Recent Labs Lab 07/15/14 2156 07/15/14 2343 07/16/14 0149 07/16/14 0358 07/16/14 0547  NA 116* 116* 118* 118* 121*  K 3.3* 3.4* 3.8 3.9 4.4  CL 79* 80* 83* 84* 86*  CO2 23 25 24 23 23   GLUCOSE 146* 92 83 91 100*  BUN 7 7 7 6 6   CREATININE 0.56 0.61 0.55 0.55 0.61  CALCIUM 8.2* 8.1* 8.0* 8.1* 8.2*   Liver Function Tests:  Recent Labs Lab 07/15/14 1102 07/16/14 0358  AST 47* 37  ALT 42* 32  ALKPHOS 81 66  BILITOT 1.5* 1.1  PROT 7.3 5.9*  ALBUMIN 4.0 3.2*    Recent Labs Lab 07/15/14 1102  LIPASE 21   No results found for this basename: AMMONIA,  in the  last 168 hours CBC:  Recent Labs Lab 07/15/14 1102 07/15/14 1309 07/16/14 0358  WBC 11.3*  --  8.1  NEUTROABS 9.0*  --   --   HGB 7.2* 13.7 12.6  HCT 35.3*  --  33.0*  MCV 79.1  --  82.5  PLT 283  --  237   Cardiac Enzymes: No results found for this basename: CKTOTAL, CKMB, CKMBINDEX, TROPONINI,  in the last 168 hours BNP (last 3 results) No results found for this basename: PROBNP,  in the last 8760 hours CBG: No results found for this basename: GLUCAP,  in the last 168 hours  Recent Results (from the past 240 hour(s))  MRSA PCR SCREENING     Status: None   Collection Time    07/15/14  4:35 PM      Result Value Ref Range Status   MRSA by PCR NEGATIVE  NEGATIVE Final   Comment:            The GeneXpert MRSA Assay (FDA     approved for NASAL specimens     only), is one component of a     comprehensive MRSA colonization     surveillance program. It is not     intended to diagnose MRSA     infection nor to guide or     monitor treatment for     MRSA infections.  Studies: Portable Chest 1 View  07/15/2014   CLINICAL DATA:  Weakness, nausea and vomiting, hyponatremia.  EXAM: PORTABLE CHEST - 1 VIEW  COMPARISON:  None.  FINDINGS: Upper limits normal heart size noted.  Mild peribronchial thickening is identified of uncertain chronicity.  Mild elevation of the right hemidiaphragm is present.  A subsegmental scar in the left lung base is noted.  There is no evidence of focal airspace disease, pulmonary edema, suspicious pulmonary nodule/mass, pleural effusion, or pneumothorax. No acute bony abnormalities are identified.  IMPRESSION: Mild peribronchial thickening - suspect chronic.  No other acute abnormalities identified.  Mild elevation of the right hemidiaphragm and left basilar subsegmental scar.   Electronically Signed   By: Laveda AbbeJeff  Hu M.D.   On: 07/15/2014 16:41    Scheduled Meds: . atorvastatin  20 mg Oral q1800  . loratadine  10 mg Oral Daily  . pantoprazole (PROTONIX)  IV  40 mg Intravenous QHS  . senna-docusate  1 tablet Oral BID   Continuous Infusions: . sodium chloride 75 mL/hr at 07/16/14 0800   Assessment/plan:  Principal Problem:   Hyponatremia Active Problems:   Nausea and vomiting   Gastroenteritis   Microcytic anemia   Elevated LFTs   Generalized weakness   Hypokalemia  1. Profound hyponatremia. Given the fact that her serum sodium has improved with normal saline infusion, her hyponatremia appears to be secondary to hypovolemia. Neurologist, Dr. Kristian CoveyBefekadu was consulted and made recommendations regarding normal saline infusion and monitoring her sodium levels-appreciate his assistance. IV fluids changed to normal saline at 75 cc an hour. We'll continue to monitor her serum sodium.  Intractable nausea and vomiting. Etiology likely secondary to an acute gastroenteritis, exacerbated by food (hot dogs with onions). Her symptoms have resolved. We will advance her diet and change PPI to by mouth.  Hypokalemia. Status post repletion in the IV fluids and oral potassium supplement. Her serum potassium has normalized. Potassium has been taken out of the IV fluids.  Hypertension. Currently stable. We'll continue atenolol, but hold hydrochlorothiazide.  Microcytic-normocytic anemia. The patient's hemoglobin was reported as 7.2 on admission. This was an obvious mistake. Her subsequent hemoglobin was 13.7 and now 12. 6. Her anemia panel is within normal limits.  Mildly elevated LFTs/transaminitis. Resolved with IV fluid hydration. The elevation may have been secondary to volume depletion and/or statin therapy.   We'll transfer the patient to telemetry.   Time spent: 35 minutes.    Pacific Northwest Eye Surgery CenterFISHER,Tamiki Kuba  [facility] Pager [phone]. If 7PM-7AM, please contact night-coverage at [email or web address], password [facility] 07/16/2014, 9:13 AM  LOS: 1 day

## 2014-07-16 NOTE — Progress Notes (Signed)
Critical sodium of 116. Same as previous reported value

## 2014-07-16 NOTE — Progress Notes (Signed)
Subjective: Interval History: has complaints some nausea but no vomiting. Patient overall feels better. She has some weakness but also that's improving. At this moment is she denies any difficulty breathing and no other complaints..  Objective: Vital signs in last 24 hours: Temp:  [97.6 F (36.4 C)-98.4 F (36.9 C)] 98.2 F (36.8 C) (07/26 0400) Pulse Rate:  [61-83] 71 (07/26 0600) Resp:  [3-21] 16 (07/26 0600) BP: (94-176)/(44-100) 113/49 mmHg (07/26 0600) SpO2:  [93 %-100 %] 100 % (07/25 1921) Weight:  [76.204 kg (168 lb)-76.9 kg (169 lb 8.5 oz)] 76.9 kg (169 lb 8.5 oz) (07/25 1636) Weight change:   Intake/Output from previous day: 07/25 0701 - 07/26 0700 In: 2487.3 [P.O.:240; I.V.:2247.3] Out: 3000 [Urine:3000] Intake/Output this shift:    General appearance: alert, cooperative and no distress Resp: clear to auscultation bilaterally Cardio: regular rate and rhythm, S1, S2 normal, no murmur, click, rub or gallop GI: soft, non-tender; bowel sounds normal; no masses,  no organomegaly Extremities: extremities normal, atraumatic, no cyanosis or edema  Lab Results:  Recent Labs  07/15/14 1102 07/15/14 1309 07/16/14 0358  WBC 11.3*  --  8.1  HGB 7.2* 13.7 12.6  HCT 35.3*  --  33.0*  PLT 283  --  237   BMET:  Recent Labs  07/16/14 0358 07/16/14 0547  NA 118* 121*  K 3.9 4.4  CL 84* 86*  CO2 23 23  GLUCOSE 91 100*  BUN 6 6  CREATININE 0.55 0.61  CALCIUM 8.1* 8.2*   No results found for this basename: PTH,  in the last 72 hours Iron Studies:  Recent Labs  07/15/14 1600  IRON 80  TIBC 312  FERRITIN 269    Studies/Results: Portable Chest 1 View  07/15/2014   CLINICAL DATA:  Weakness, nausea and vomiting, hyponatremia.  EXAM: PORTABLE CHEST - 1 VIEW  COMPARISON:  None.  FINDINGS: Upper limits normal heart size noted.  Mild peribronchial thickening is identified of uncertain chronicity.  Mild elevation of the right hemidiaphragm is present.  A subsegmental scar  in the left lung base is noted.  There is no evidence of focal airspace disease, pulmonary edema, suspicious pulmonary nodule/mass, pleural effusion, or pneumothorax. No acute bony abnormalities are identified.  IMPRESSION: Mild peribronchial thickening - suspect chronic.  No other acute abnormalities identified.  Mild elevation of the right hemidiaphragm and left basilar subsegmental scar.   Electronically Signed   By: Laveda AbbeJeff  Hu M.D.   On: 07/15/2014 16:41    I have reviewed the patient's current medications.  Assessment/Plan: Problem #1 hyponatremia: This taught  to be secondary to hypovolemic hyponatremia. Her sodium is 121 has improved.  Problem #2 hypokalemia: She is on potassium supplement her potassium is 4.4 his has corrected. Problem #3 nausea and vomiting: Presently improving. Problem #4 hypertension: Her blood pressure is fluctuating but reasonably controlled Problem #5 anemia : Her hemoglobin hematocrit better Problem #6 a letter disorder Problem #7 elevated LFTs. Plan: DC normal saline with potassium Start on normal saline at 75 cc per hour We'll check her basic metabolic panel in the morning.   LOS: 1 day   Rennie Rouch S 07/16/2014,7:21 AM

## 2014-07-16 NOTE — Progress Notes (Signed)
Critical sodium of 121. Value improving over previous.

## 2014-07-17 LAB — CBC WITH DIFFERENTIAL/PLATELET: WBC: 11.3 10*3/uL — AB (ref 4.0–10.5)

## 2014-07-17 LAB — BASIC METABOLIC PANEL
Anion gap: 11 (ref 5–15)
BUN: 10 mg/dL (ref 6–23)
CO2: 24 mEq/L (ref 19–32)
Calcium: 9 mg/dL (ref 8.4–10.5)
Chloride: 97 mEq/L (ref 96–112)
Creatinine, Ser: 0.7 mg/dL (ref 0.50–1.10)
GFR calc Af Amer: >90 mL/min (ref >90)
GFR, EST NON AFRICAN AMERICAN: 85 mL/min — AB (ref >90)
Glucose, Bld: 118 mg/dL — ABNORMAL HIGH (ref 70–99)
Potassium: 4.4 mEq/L (ref 3.7–5.3)
SODIUM: 132 meq/L — AB (ref 137–147)

## 2014-07-17 LAB — OCCULT BLOOD, POC DEVICE: FECAL OCCULT BLD: NEGATIVE

## 2014-07-17 MED ORDER — ATENOLOL 50 MG PO TABS: 50.0000 mg | ORAL_TABLET | Freq: Every day | ORAL | Status: DC

## 2014-07-17 MED ORDER — POTASSIUM CHLORIDE CRYS ER 20 MEQ PO TBCR: 10.0000 meq | EXTENDED_RELEASE_TABLET | Freq: Every day | ORAL | Status: DC

## 2014-07-17 NOTE — Progress Notes (Signed)
Subjective: Interval History: Patient complains of feeling dizzy when her mattress inflates and deflates.  Objective: Vital signs in last 24 hours: Temp:  [97.6 F (36.4 C)-98 F (36.7 C)] 98 F (36.7 C) (07/27 0503) Pulse Rate:  [68-77] 72 (07/27 0503) Resp:  [16-20] 20 (07/27 0503) BP: (120-167)/(59-83) 167/83 mmHg (07/27 0503) Weight change:   Intake/Output from previous day: 07/26 0701 - 07/27 0700 In: 2210 [P.O.:240; I.V.:1970] Out: 1600 [Urine:1600] Intake/Output this shift:    General appearance: alert, cooperative and no distress Resp: clear to auscultation bilaterally Cardio: regular rate and rhythm, S1, S2 normal, no murmur, click, rub or gallop GI: soft, non-tender; bowel sounds normal; no masses,  no organomegaly Extremities: extremities normal, atraumatic, no cyanosis or edema  Lab Results:  Recent Labs  07/15/14 1102 07/15/14 1309 07/16/14 0358  WBC 11.3*  --  8.1  HGB 7.2* 13.7 12.6  HCT 35.3*  --  33.0*  PLT 283  --  237   BMET:   Recent Labs  07/16/14 1508 07/17/14 0608  NA 122* 132*  K 4.3 4.4  CL 89* 97  CO2 21 24  GLUCOSE 118* 118*  BUN 6 10  CREATININE 0.56 0.70  CALCIUM 8.6 9.0   No results found for this basename: PTH,  in the last 72 hours Iron Studies:   Recent Labs  07/15/14 1600  IRON 80  TIBC 312  FERRITIN 269    Studies/Results: Portable Chest 1 View  07/15/2014   CLINICAL DATA:  Weakness, nausea and vomiting, hyponatremia.  EXAM: PORTABLE CHEST - 1 VIEW  COMPARISON:  None.  FINDINGS: Upper limits normal heart size noted.  Mild peribronchial thickening is identified of uncertain chronicity.  Mild elevation of the right hemidiaphragm is present.  A subsegmental scar in the left lung base is noted.  There is no evidence of focal airspace disease, pulmonary edema, suspicious pulmonary nodule/mass, pleural effusion, or pneumothorax. No acute bony abnormalities are identified.  IMPRESSION: Mild peribronchial thickening -  suspect chronic.  No other acute abnormalities identified.  Mild elevation of the right hemidiaphragm and left basilar subsegmental scar.   Electronically Signed   By: Laveda AbbeJeff  Hu M.D.   On: 07/15/2014 16:41    I have reviewed the patient's current medications.  Assessment/Plan: Problem #1 hyponatremia: This taught  to be secondary to hypovolemic hyponatremia. Her sodium is 132 has improved no nausea or vomiting  Problem #2 hypokalemia: Her potassium has corrected Problem #3 nausea and vomiting: Presently improving. Problem #4 hypertension: Her blood pressure is fluctuating but reasonably controlled Problem #5 anemia : Her hemoglobin and  hematocrit better Problem #6 a letter disorder Plan: DC his ivf Since her sodium has improved I will sign of . Thank you   LOS: 2 days   Filiberto Wamble S 07/17/2014,8:27 AM

## 2014-07-17 NOTE — Discharge Summary (Signed)
Physician Discharge Summary  Nicole Cobb WUJ:[id number] DOB: 1941-03-11 DOA: 07/15/2014  PCP: Cassell Smiles., MD  Admit date: 07/15/2014 Discharge date: 07/17/2014  Time spent: Greater than 30 minutes  Recommendations for Outpatient Follow-up:  1. The patient's serum sodium will need to be reassessed in 3-7 days.    Discharge Diagnoses:  1. Hyponatremia, secondary to hypovolemia in the setting of drinking hypotonic water. --- The patient's serum sodium was 111 on admission and 132 at the time of discharge. 2. Nausea and vomiting, thought to be secondary to an acute gastroenteritis. Resolved. 3. Hypokalemia, secondary to vomiting and hydrochlorothiazide. Supplemented and repleted. 4. Hypertension. Remained stable. 5. Generalized weakness secondary to #1 and #2. Resolved. 6. Mildly elevated liver transaminases. Likely secondary to dehydration. Resolved. 7. Lab error regarding the patient's hemoglobin. Initially reported at 7.2, but was repeated at 13.7.  Discharge Condition: Improved.  Diet recommendation: Regular as tolerated.  Filed Weights   07/15/14 0932 07/15/14 1636  Weight: 76.204 kg (168 lb) 76.9 kg (169 lb 8.5 oz)    History of present illness:   Nicole Cobb is a 73 y.o. female with a history of hypertension, hiatal hernia, and vertigo, who presents to the emergency department from her primary care physician's office (Dr. Sherwood Gambler) after she was found to be lightheaded and generally weak. She presented to his office today with a 24-36 hour history of intractable nausea and vomiting. Two days before admissiom, she ate 2 hot dogs with a lot of onions. The next day, she began feeling ill with nausea. She started vomiting "all night long". She did not see bright red blood, but said her emesis was dark. She had no associated abdominal pain or cramping, pain with urination, or diarrhea. She denied blacked tarry stools or bright red blood per rectum.  She was also seen by Dr.  Sherwood Gambler last week for a routine checkup and blood work. At that time, she was told that her sodium and potassium were low and her cholesterol was high. She was started on potassium chloride supplementation and Lipitor. She also reported drinking at least 2-3 quarts of water daily because she was told that drinking water was good for her-this was estimated per her husband also drank about the same. She was also treated with Z-Pak and a "shot" in the office last week for a "sinus congestion".  In the ED, she was afebrile and hemodynamically stable. Her lab data were significant for a serum sodium of 111, potassium of 3.4, WBC of 11.3 and hemoglobin of 7.2. The hemoglobin was rechecked and it was 13.7. Her EKG revealed sinus rhythm with nonspecific T-wave abnormalities in the lateral/anterior leads. She was admitted for further evaluation and management.   Hospital Course:  The patient was started on normal saline with potassium chloride added. For further evaluation, a number of studies were ordered and nephrologist, Dr. Kristian Covey was consulted. The lab studies were consistent with hypovolemic hyponatremia. Her TSH was within normal limits. Her uric acid was within normal limits. Her vitamin B12 level was greater than 2000 (the patient takes a vitamin B12 supplement). Dr. Kristian Covey agreed with medical management with a few changes. He increased the rate of the normal saline. Her serum sodium was assessed every 2 hours and if her sodium increased 1-2 mEq, the IV fluid rate would stay the same until her sodium improved to 118-120. Her serum sodium improved progressively. The right of the IV fluids were decreased and then eventually discontinued when her serum sodium improved  to 132. Given that her serum sodium had improved with normal saline, it was believed that her hyponatremia was secondary to hypovolemia in the setting of recent vomiting and therapy with hydrochlorothiazide. She was replacing her fluids with  hypotonic water which progressively decreased her serum sodium. Surprisingly, the patient had very few if any neurological symptoms other than feeling weak generally. This resolved at the time of discharge.  The patient's nausea and vomiting resolved. She was treated supportively and symptomatically with Zofran and Protonix. It was thought that she had an acute gastroenteritis after eating a couple of hot dogs several days ago. She was initially started on a clear liquid diet, but it was slowly advanced. She tolerated the advancement to solid foods well.  At the time of discharge, she was instructed to replace some of her water intake with either Gatorade or coconut water which both have electrolytes. She was also instructed to be a little more liberal with the salt shaker at the table for only 2-3 more days. Hydrochlorothiazide was discontinued. She was continued on atenolol for treatment of her hypertension. She will need her serum sodium reassessed at her hospital followup appointment.   Procedures:  None  Consultations:  Nephrology, Dr. Kristian Covey  Discharge Exam: Filed Vitals:   07/17/14 0941  BP: 137/57  Pulse: 71  Temp:   Resp: 20    General: Pleasant 73 year old woman sitting up in bed, in no acute distress. Cardiovascular: S1, S2, with a soft systolic murmur. Respiratory: Clear to auscultation bilaterally. Abdomen: Positive bowel sounds, mildly obese, nontender, nondistended. Musculoskeletal/extremities: No pedal edema. No acute hot red joints. Neurologic: She is alert and oriented x3. Cranial nerves II through XII are intact.    Discharge Instructions You were cared for by a hospitalist during your hospital stay. If you have any questions about your discharge medications or the care you received while you were in the hospital after you are discharged, you can call the unit and asked to speak with the hospitalist on call if the hospitalist that took care of you is not available.  Once you are discharged, your primary care physician will handle any further medical issues. Please note that NO REFILLS for any discharge medications will be authorized once you are discharged, as it is imperative that you return to your primary care physician (or establish a relationship with a primary care physician if you do not have one) for your aftercare needs so that they can reassess your need for medications and monitor your lab values.  Discharge Instructions   Diet general    Complete by:  As directed      Discharge instructions    Complete by:  As directed   Decrease water intake to 6 glasses or less daily. Drink Gatorade once or twice daily. Add a little salty food at the table for 3 more days. Atenolol-hydrochlorothiazide was discontinued. Start atenolol as prescribed. Decrease potassium chloride to half a tablet daily.     Increase activity slowly    Complete by:  As directed             Medication List    STOP taking these medications       atenolol-chlorthalidone 50-25 MG per tablet  Commonly known as:  TENORETIC     bisoprolol-hydrochlorothiazide 10-6.25 MG per tablet  Commonly known as:  ZIAC      TAKE these medications       ALPRAZolam 0.5 MG tablet  Commonly known as:  XANAX  Take  1 tablet (0.5 mg total) by mouth 2 (two) times daily as needed for sleep or anxiety.     aspirin 81 MG tablet  Take 81 mg by mouth daily.     atenolol 50 MG tablet  Commonly known as:  TENORMIN  Take 1 tablet (50 mg total) by mouth daily.     atorvastatin 40 MG tablet  Commonly known as:  LIPITOR  Take 1 tablet by mouth daily.     cetirizine 10 MG tablet  Commonly known as:  ZYRTEC  Take 10 mg by mouth daily.     D3-1000 PO  Take 1,000 mg by mouth daily.     loratadine 10 MG tablet  Commonly known as:  CLARITIN  Take 10 mg by mouth daily.     LUTEIN 20 PO  Take 20 mg by mouth daily.     meclizine 25 MG tablet  Commonly known as:  ANTIVERT  Take 1 tablet (25  mg total) by mouth 3 (three) times daily as needed for dizziness or nausea.     naproxen sodium 220 MG tablet  Commonly known as:  ANAPROX  Take 220 mg by mouth 2 (two) times daily as needed (pain).     OMEGA 3 PO  Take 1 tablet by mouth daily.     omeprazole 20 MG capsule  Commonly known as:  PRILOSEC  Take 20 mg by mouth daily.     potassium chloride SA 20 MEQ tablet  Commonly known as:  K-DUR,KLOR-CON  Take 0.5 tablets (10 mEq total) by mouth daily.     promethazine 12.5 MG tablet  Commonly known as:  PHENERGAN  Take 2 tablets (25 mg total) by mouth every 6 (six) hours as needed for nausea.     vitamin B-12 500 MCG tablet  Commonly known as:  CYANOCOBALAMIN  Take 500 mcg by mouth daily.       No Known Allergies    The results of significant diagnostics from this hospitalization (including imaging, microbiology, ancillary and laboratory) are listed below for reference.    Significant Diagnostic Studies: Portable Chest 1 View  07/15/2014   CLINICAL DATA:  Weakness, nausea and vomiting, hyponatremia.  EXAM: PORTABLE CHEST - 1 VIEW  COMPARISON:  None.  FINDINGS: Upper limits normal heart size noted.  Mild peribronchial thickening is identified of uncertain chronicity.  Mild elevation of the right hemidiaphragm is present.  A subsegmental scar in the left lung base is noted.  There is no evidence of focal airspace disease, pulmonary edema, suspicious pulmonary nodule/mass, pleural effusion, or pneumothorax. No acute bony abnormalities are identified.  IMPRESSION: Mild peribronchial thickening - suspect chronic.  No other acute abnormalities identified.  Mild elevation of the right hemidiaphragm and left basilar subsegmental scar.   Electronically Signed   By: Laveda Abbe M.D.   On: 07/15/2014 16:41    Microbiology: Recent Results (from the past 240 hour(s))  MRSA PCR SCREENING     Status: None   Collection Time    07/15/14  4:35 PM      Result Value Ref Range Status   MRSA by  PCR NEGATIVE  NEGATIVE Final   Comment:            The GeneXpert MRSA Assay (FDA     approved for NASAL specimens     only), is one component of a     comprehensive MRSA colonization     surveillance program. It is not     intended to  diagnose MRSA     infection nor to guide or     monitor treatment for     MRSA infections.     Labs: Basic Metabolic Panel:  Recent Labs Lab 07/16/14 0149 07/16/14 0358 07/16/14 0547 07/16/14 1508 07/17/14 0608  NA 118* 118* 121* 122* 132*  K 3.8 3.9 4.4 4.3 4.4  CL 83* 84* 86* 89* 97  CO2 24 23 23 21 24   GLUCOSE 83 91 100* 118* 118*  BUN 7 6 6 6 10   CREATININE 0.55 0.55 0.61 0.56 0.70  CALCIUM 8.0* 8.1* 8.2* 8.6 9.0   Liver Function Tests:  Recent Labs Lab 07/15/14 1102 07/16/14 0358  AST 47* 37  ALT 42* 32  ALKPHOS 81 66  BILITOT 1.5* 1.1  PROT 7.3 5.9*  ALBUMIN 4.0 3.2*    Recent Labs Lab 07/15/14 1102  LIPASE 21   No results found for this basename: AMMONIA,  in the last 168 hours CBC:  Recent Labs Lab 07/15/14 1102 07/15/14 1309 07/16/14 0358  WBC 11.3*  --  8.1  NEUTROABS 9.0*  --   --   HGB 7.2* 13.7 12.6  HCT 35.3*  --  33.0*  MCV 79.1  --  82.5  PLT 283  --  237   Cardiac Enzymes: No results found for this basename: CKTOTAL, CKMB, CKMBINDEX, TROPONINI,  in the last 168 hours BNP: BNP (last 3 results) No results found for this basename: PROBNP,  in the last 8760 hours CBG: No results found for this basename: GLUCAP,  in the last 168 hours     Signed:  Britten Seyfried  [facility] 07/17/2014, 11:07 AM

## 2014-07-17 NOTE — Progress Notes (Signed)
Discharged home today with instructions given on medication,and follow up visits,patient verbalized understanding.Prescription sent with patient. No c/o pain or discomfort noted.Accompanied by staff to an awaiting vehicle.

## 2014-07-26 NOTE — Progress Notes (Signed)
Ur review complete.  

## 2015-01-06 ENCOUNTER — Emergency Department (HOSPITAL_COMMUNITY): Payer: Medicare Other

## 2015-01-06 ENCOUNTER — Emergency Department (HOSPITAL_COMMUNITY)
Admission: EM | Admit: 2015-01-06 | Discharge: 2015-01-06 | Disposition: A | Discharge status: Home / Self Care | Payer: Medicare Other | Attending: Emergency Medicine | Admitting: Emergency Medicine

## 2015-01-06 ENCOUNTER — Encounter (HOSPITAL_COMMUNITY): Payer: Self-pay | Admitting: *Deleted

## 2015-01-06 DIAGNOSIS — Z79899 Other long term (current) drug therapy: Secondary | ICD-10-CM | POA: Insufficient documentation

## 2015-01-06 DIAGNOSIS — K219 Gastro-esophageal reflux disease without esophagitis: Secondary | ICD-10-CM | POA: Insufficient documentation

## 2015-01-06 DIAGNOSIS — F419 Anxiety disorder, unspecified: Secondary | ICD-10-CM | POA: Insufficient documentation

## 2015-01-06 DIAGNOSIS — E785 Hyperlipidemia, unspecified: Secondary | ICD-10-CM | POA: Diagnosis not present

## 2015-01-06 DIAGNOSIS — Z7982 Long term (current) use of aspirin: Secondary | ICD-10-CM | POA: Insufficient documentation

## 2015-01-06 DIAGNOSIS — R112 Nausea with vomiting, unspecified: Secondary | ICD-10-CM | POA: Diagnosis not present

## 2015-01-06 DIAGNOSIS — R4702 Dysphasia: Secondary | ICD-10-CM | POA: Diagnosis not present

## 2015-01-06 DIAGNOSIS — I10 Essential (primary) hypertension: Secondary | ICD-10-CM | POA: Diagnosis not present

## 2015-01-06 DIAGNOSIS — Z862 Personal history of diseases of the blood and blood-forming organs and certain disorders involving the immune mechanism: Secondary | ICD-10-CM | POA: Insufficient documentation

## 2015-01-06 DIAGNOSIS — R51 Headache: Secondary | ICD-10-CM | POA: Diagnosis not present

## 2015-01-06 LAB — CBC WITH DIFFERENTIAL/PLATELET
BASOS PCT: 0 % (ref 0–1)
Basophils Absolute: 0 10*3/uL (ref 0.0–0.1)
EOS ABS: 0 10*3/uL (ref 0.0–0.7)
EOS PCT: 0 % (ref 0–5)
HEMATOCRIT: 40 % (ref 36.0–46.0)
HEMOGLOBIN: 13.6 g/dL (ref 12.0–15.0)
Lymphocytes Relative: 19 % (ref 12–46)
Lymphs Abs: 2.2 10*3/uL (ref 0.7–4.0)
MCH: 30.9 pg (ref 26.0–34.0)
MCHC: 34 g/dL (ref 30.0–36.0)
MCV: 90.9 fL (ref 78.0–100.0)
MONOS PCT: 8 % (ref 3–12)
Monocytes Absolute: 0.9 10*3/uL (ref 0.1–1.0)
NEUTROS ABS: 8.4 10*3/uL — AB (ref 1.7–7.7)
Neutrophils Relative %: 73 % (ref 43–77)
Platelets: 251 10*3/uL (ref 150–400)
RBC: 4.4 MIL/uL (ref 3.87–5.11)
RDW: 12.4 % (ref 11.5–15.5)
WBC: 11.5 10*3/uL — ABNORMAL HIGH (ref 4.0–10.5)

## 2015-01-06 LAB — URINALYSIS, ROUTINE W REFLEX MICROSCOPIC
Bilirubin Urine: NEGATIVE
Glucose, UA: NEGATIVE mg/dL
Hgb urine dipstick: NEGATIVE
Ketones, ur: NEGATIVE mg/dL
LEUKOCYTES UA: NEGATIVE
NITRITE: NEGATIVE
PH: 6 (ref 5.0–8.0)
Protein, ur: NEGATIVE mg/dL
Specific Gravity, Urine: <1.005 — ABNORMAL LOW (ref 1.005–1.030)
UROBILINOGEN UA: 0.2 mg/dL (ref 0.0–1.0)

## 2015-01-06 LAB — COMPREHENSIVE METABOLIC PANEL
ALBUMIN: 3.9 g/dL (ref 3.5–5.2)
ALK PHOS: 96 U/L (ref 39–117)
ALT: 45 U/L — AB (ref 0–35)
AST: 43 U/L — ABNORMAL HIGH (ref 0–37)
Anion gap: 10 (ref 5–15)
BILIRUBIN TOTAL: 0.9 mg/dL (ref 0.3–1.2)
BUN: 12 mg/dL (ref 6–23)
CO2: 23 mmol/L (ref 19–32)
CREATININE: 0.58 mg/dL (ref 0.50–1.10)
Calcium: 8.8 mg/dL (ref 8.4–10.5)
Chloride: 102 mEq/L (ref 96–112)
GFR calc Af Amer: >90 mL/min (ref >90)
GFR calc non Af Amer: >90 mL/min (ref >90)
Glucose, Bld: 126 mg/dL — ABNORMAL HIGH (ref 70–99)
Potassium: 3.9 mmol/L (ref 3.5–5.1)
Sodium: 135 mmol/L (ref 135–145)
Total Protein: 7.1 g/dL (ref 6.0–8.3)

## 2015-01-06 LAB — APTT: aPTT: 27 seconds (ref 24–37)

## 2015-01-06 LAB — PROTIME-INR
INR: 1.09 (ref 0.00–1.49)
Prothrombin Time: 14.2 seconds (ref 11.6–15.2)

## 2015-01-06 IMAGING — CT CT HEAD W/O CM
1 series · 16 of 30 positions shown, 20 images · non-contrast
Comparison: [DATE]

CLINICAL DATA: Headache, nausea, vomiting.

EXAM:
CT HEAD WITHOUT CONTRAST
TECHNIQUE: Contiguous axial images were obtained from the base of the skull
through the vertex without intravenous contrast.

[Series 2: headseq 4.8 h37s · axial · 0.47mm/px · z∈[+122,+282]mm · 16 of 36 slices shown, 20 images]
[im 2/36  brain]
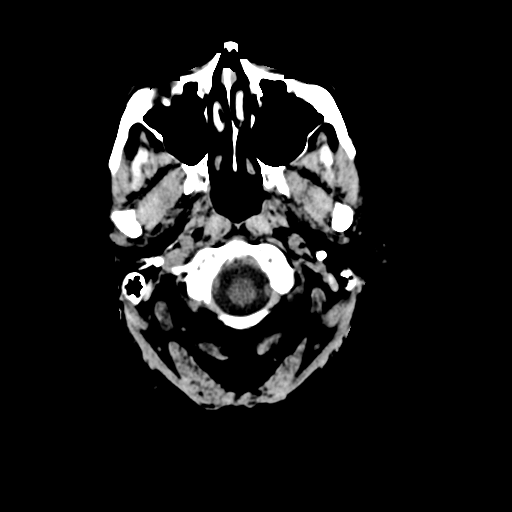
[im 2/36  bone]
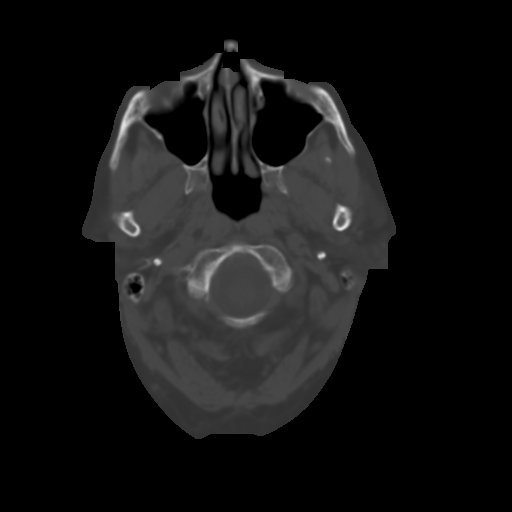
[im 4/36  brain]
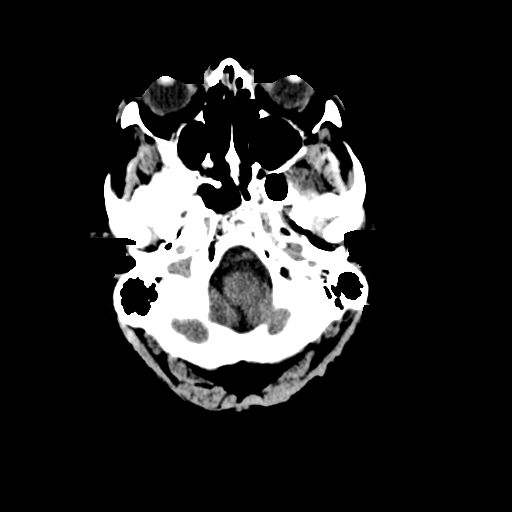
[im 7/36  brain]
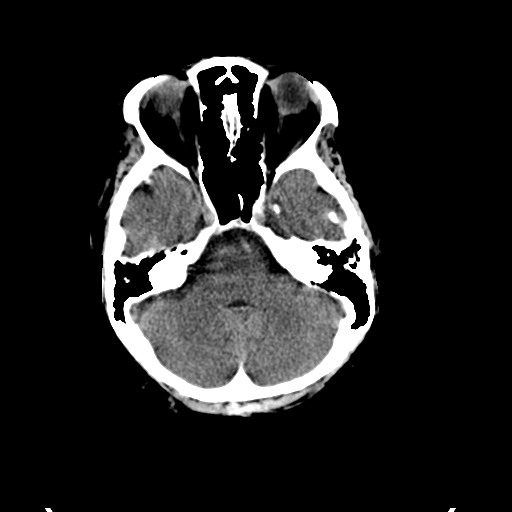
[im 9/36  brain]
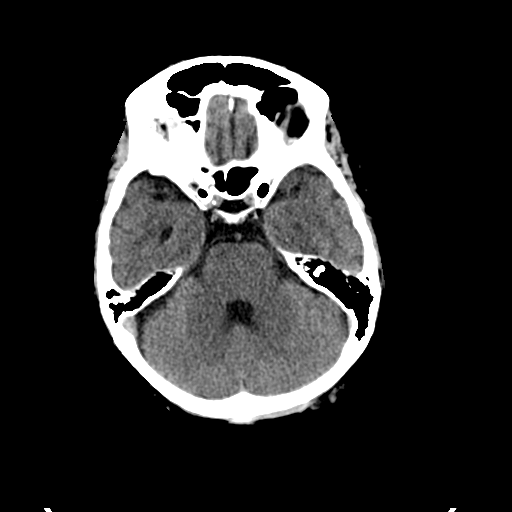
[im 10/36  brain]
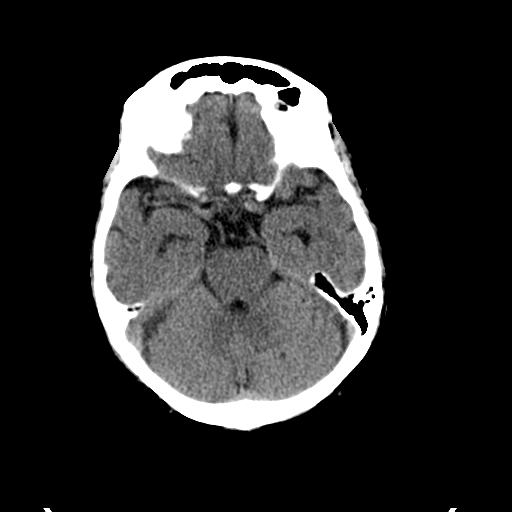
[im 10/36  bone]
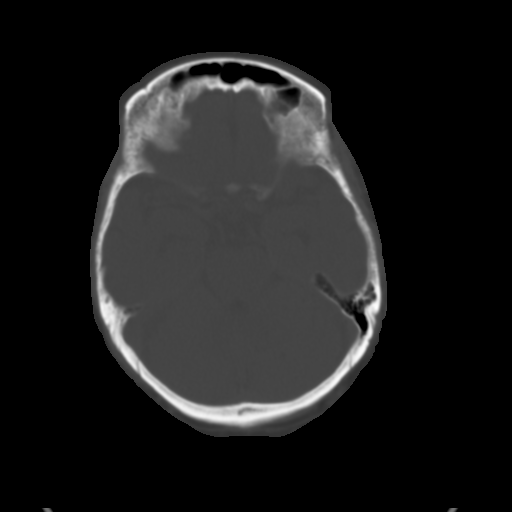
[im 13/36  brain]
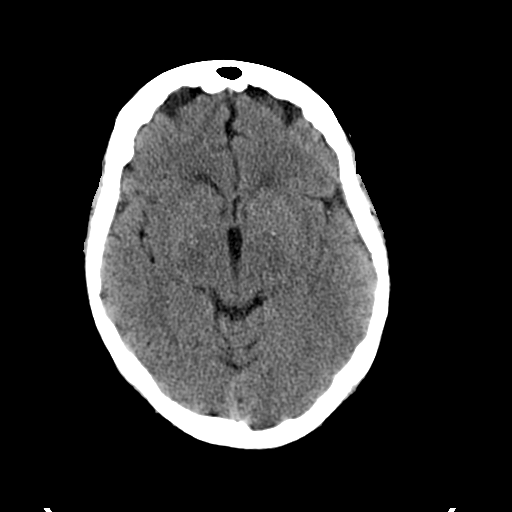
[im 15/36  brain]
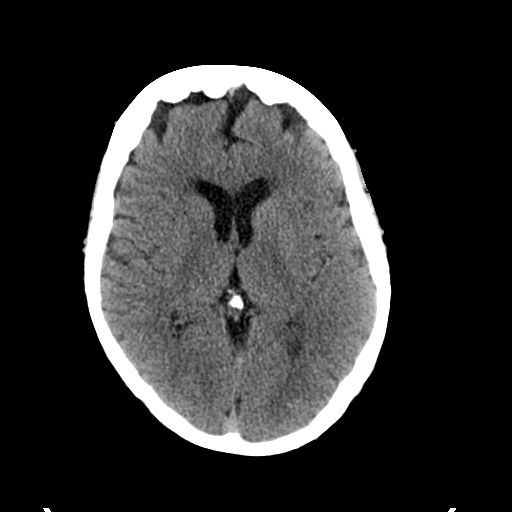
[im 17/36  brain]
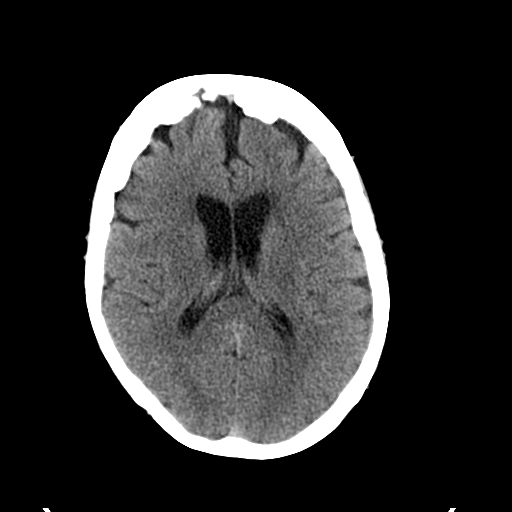
[im 19/36  brain]
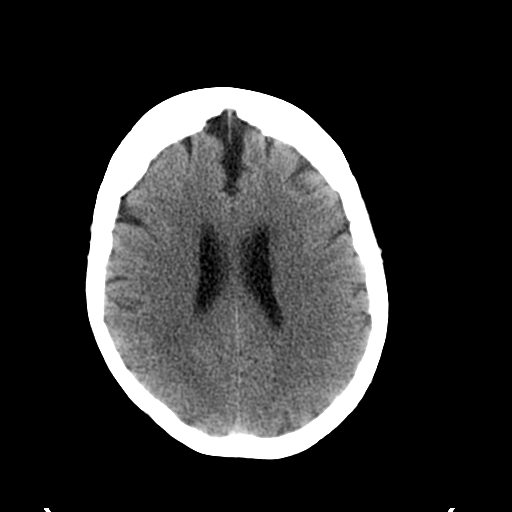
[im 19/36  bone]
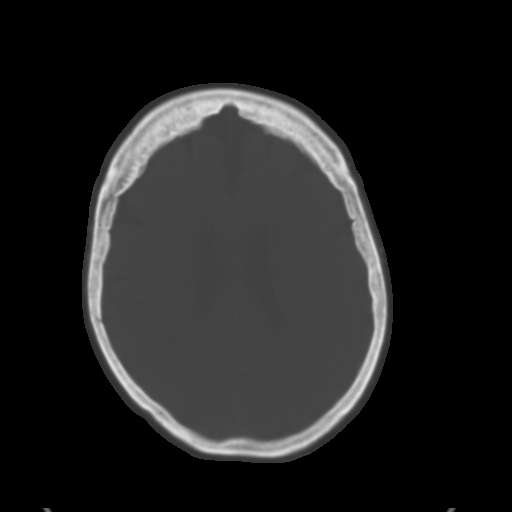
[im 21/36  brain]
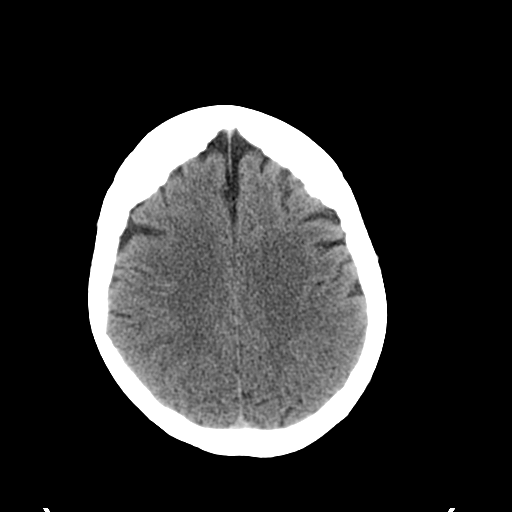
[im 23/36  brain]
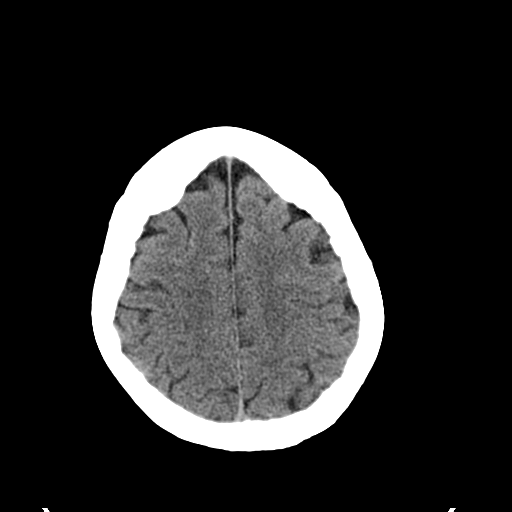
[im 26/36  brain]
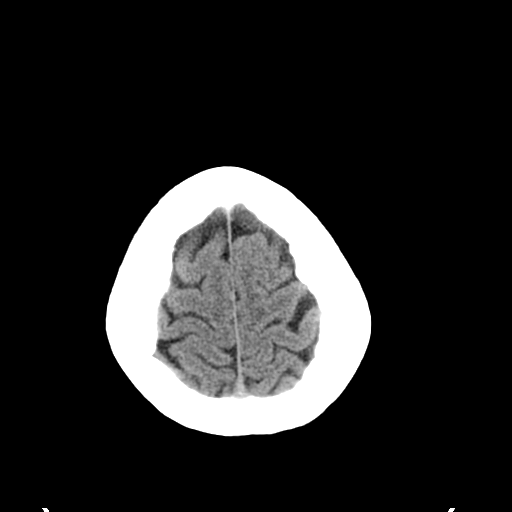
[im 27/36  brain]
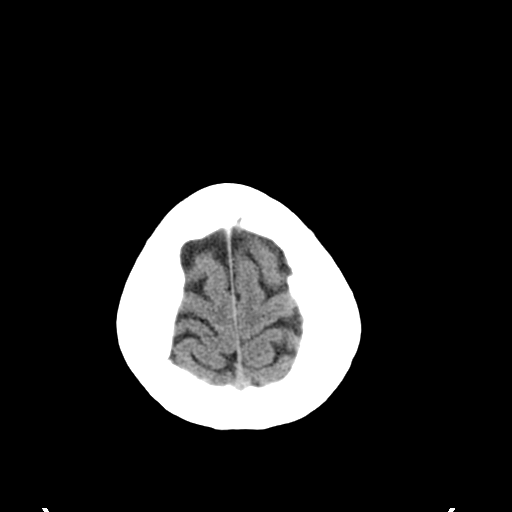
[im 27/36  bone]
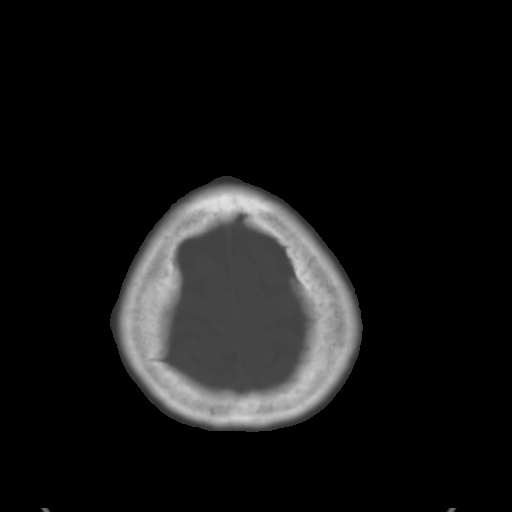
[im 29/36  brain]
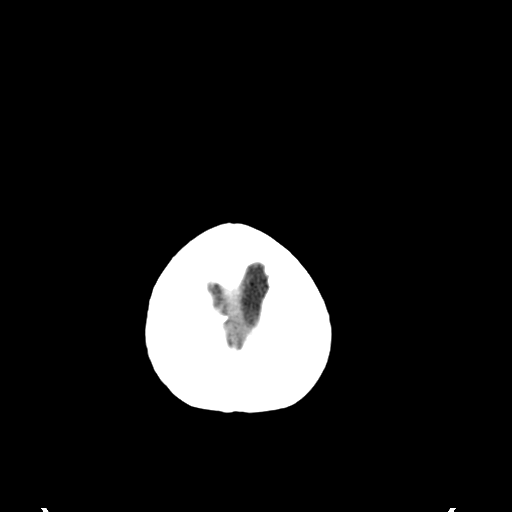
[im 32/36  brain]
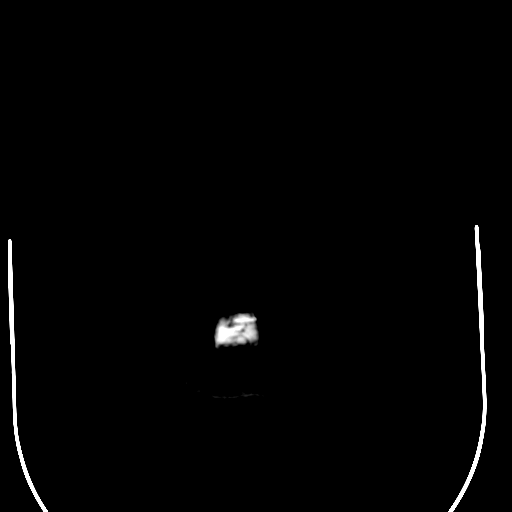
[im 34/36  brain]
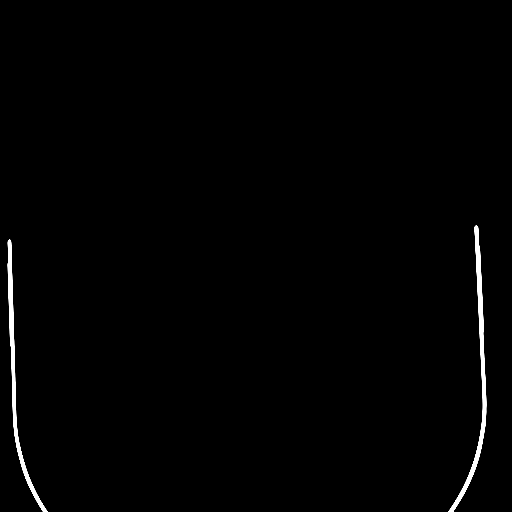

[16 of 30 positions shown; findings below may reference images not displayed]

FINDINGS: No acute intracranial abnormality. Specifically, no hemorrhage,
hydrocephalus, mass lesion, acute infarction, or significant
intracranial injury. No acute calvarial abnormality. Visualized
paranasal sinuses and mastoids clear. Orbital soft tissues
unremarkable.
IMPRESSION: No acute intracranial abnormality.

## 2015-01-06 NOTE — ED Notes (Signed)
Patient having slight word finding difficulty.  Could not think of the word "hospital" when asked where she was.  Had difficulty initially following instructions for  finger-to-nose testing and for dorsiflexion of feet.  Negative Romberg.  Steady gait when walking to restroom.

## 2015-01-06 NOTE — Discharge Instructions (Signed)
CT scan of your head was normal. Follow-up with your primary care doctor next week

## 2015-01-06 NOTE — ED Provider Notes (Signed)
CSN: [id number]     Arrival date & time 01/06/15  1041 History  This chart was scribed for Nicole Hutching, MD by Freida Busman, ED Scribe. This patient was seen in room APA08/APA08 and the patient's care was started 11:18 AM.    Chief Complaint  Patient presents with  . Emesis  . Headache      The history is provided by the patient and the spouse. No language interpreter was used.     HPI Comments:  Nicole Cobb is a 74 y.o. female who presents to the Emergency Department complaining of nausea and vomiting that started last night. Pt and husband note nausea/vomiting was preceded by an episode of confusion, slurred speech and difficulty finding the words. Pt notes she was reading when episode occurred and the episode lasted ~10 minutes. Pt has a h/o hyponatremia with similar nausea and vomiting. She also complains of a frontal HA; states her "head feels full".  No alleviating factors noted. She is now back to baseline.   Past Medical History  Diagnosis Date  . Hiatal hernia   . Anxiety   . Hypokalemia   . BPV (benign positional vertigo)   . Anemia   . Hyperlipidemia   . HTN (hypertension)   . Vertigo    Past Surgical History  Procedure Laterality Date  . Tubal ligation     History reviewed. No pertinent family history. History  Substance Use Topics  . Smoking status: Never Smoker   . Smokeless tobacco: Not on file  . Alcohol Use: No   OB History    No data available     Review of Systems  10 Systems reviewed and are negative for acute change except as noted in the HPI.  Allergies  Review of patient's allergies indicates no known allergies.  Home Medications   Prior to Admission medications   Medication Sig Start Date End Date Taking? Authorizing Provider  ALPRAZolam Prudy Feeler) 0.5 MG tablet Take 1 tablet (0.5 mg total) by mouth 2 (two) times daily as needed for sleep or anxiety. Patient taking differently: Take 0.5 mg by mouth 2 (two) times daily.  09/02/13  Yes  Erick Blinks, MD  aspirin 81 MG tablet Take 81 mg by mouth daily.   Yes Historical Provider, MD  atenolol (TENORMIN) 50 MG tablet Take 1 tablet (50 mg total) by mouth daily. 07/17/14  Yes Elliot Cousin, MD  atorvastatin (LIPITOR) 40 MG tablet Take 1 tablet by mouth at bedtime.  07/06/14  Yes Historical Provider, MD  cetirizine (ZYRTEC) 10 MG tablet Take 10 mg by mouth daily as needed for allergies.    Yes Historical Provider, MD  Cholecalciferol (D3-1000 PO) Take 2,000 mg by mouth daily.    Yes Historical Provider, MD  co-enzyme Q-10 50 MG capsule Take 100 mg by mouth at bedtime.   Yes Historical Provider, MD  loratadine (CLARITIN) 10 MG tablet Take 10 mg by mouth daily.   Yes Historical Provider, MD  Misc Natural Products (LUTEIN 20 PO) Take 20 mg by mouth daily.   Yes Historical Provider, MD  Omega-3 Fatty Acids (OMEGA 3 PO) Take 1 tablet by mouth daily.   Yes Historical Provider, MD  omeprazole (PRILOSEC) 20 MG capsule Take 20 mg by mouth daily.   Yes Historical Provider, MD  potassium chloride SA (K-DUR,KLOR-CON) 20 MEQ tablet Take 0.5 tablets (10 mEq total) by mouth daily. Patient taking differently: Take 20 mEq by mouth at bedtime.  07/17/14  Yes Elliot Cousin, MD  vitamin B-12 (CYANOCOBALAMIN) 500 MCG tablet Take 2,000 mcg by mouth daily.    Yes Historical Provider, MD  meclizine (ANTIVERT) 25 MG tablet Take 1 tablet (25 mg total) by mouth 3 (three) times daily as needed for dizziness or nausea. Patient not taking: Reported on 01/06/2015 09/02/13   Erick Blinks, MD  promethazine (PHENERGAN) 12.5 MG tablet Take 2 tablets (25 mg total) by mouth every 6 (six) hours as needed for nausea. 09/02/13   Erick Blinks, MD   BP 147/75 mmHg  Pulse 78  Temp(Src) 97.5 F (36.4 C) (Oral)  Resp 18  Ht  (1.626 m)  Wt 175 lb (79.379 kg)  BMI 30.02 kg/m2  SpO2 98% Physical Exam  Constitutional: She is oriented to person, place, and time. She appears well-developed and well-nourished.  HENT:   Head: Normocephalic and atraumatic.  Eyes: Conjunctivae and EOM are normal. Pupils are equal, round, and reactive to light.  Neck: Normal range of motion. Neck supple.  Cardiovascular: Normal rate and regular rhythm.   Pulmonary/Chest: Effort normal and breath sounds normal.  Abdominal: Soft. Bowel sounds are normal.  Musculoskeletal: Normal range of motion.  Neurological: She is alert and oriented to person, place, and time.  Skin: Skin is warm and dry.  Psychiatric: She has a normal mood and affect. Her behavior is normal.  Nursing note and vitals reviewed.   ED Course  Procedures   DIAGNOSTIC STUDIES:  Oxygen Saturation is 100% on RA, normal by my interpretation.    COORDINATION OF CARE:  11:34 AM Will order CT and blood work. Discussed treatment plan with pt and husband at bedside and pt agreed to plan.  Labs Review Labs Reviewed  CBC WITH DIFFERENTIAL - Abnormal; Notable for the following:    WBC 11.5 (*)    Neutro Abs 8.4 (*)    All other components within normal limits  COMPREHENSIVE METABOLIC PANEL - Abnormal; Notable for the following:    Glucose, Bld 126 (*)    AST 43 (*)    ALT 45 (*)    All other components within normal limits  URINALYSIS, ROUTINE W REFLEX MICROSCOPIC - Abnormal; Notable for the following:    Specific Gravity, Urine <1.005 (*)    All other components within normal limits  PROTIME-INR  APTT    Imaging Review Ct Head Wo Contrast  01/06/2015   CLINICAL DATA:  Headache, nausea, vomiting.  EXAM: CT HEAD WITHOUT CONTRAST  TECHNIQUE: Contiguous axial images were obtained from the base of the skull through the vertex without intravenous contrast.  COMPARISON:  09/01/2013  FINDINGS: No acute intracranial abnormality. Specifically, no hemorrhage, hydrocephalus, mass lesion, acute infarction, or significant intracranial injury. No acute calvarial abnormality. Visualized paranasal sinuses and mastoids clear. Orbital soft tissues unremarkable.   IMPRESSION: No acute intracranial abnormality.   Electronically Signed   By: Charlett Nose M.D.   On: 01/06/2015 14:02     EKG Interpretation   Date/Time:  Saturday January 06 2015 10:58:04 EST Ventricular Rate:  79 PR Interval:  160 QRS Duration: 78 QT Interval:  390 QTC Calculation: 447 R Axis:   46 Text Interpretation:  Sinus rhythm Confirmed by Adriana Simas  MD, Nikkie Liming ([id number]) on  01/06/2015 12:54:08 PM      MDM   Final diagnoses:  Dysphasia    History and physical consistent with TIA. She appears to be back to baseline. Normal conversation with my exam. She prefers to go home and follow-up with her primary care doctor. Findings discussed with  patient and her husband  I personally performed the services described in this documentation, which was scribed in my presence. The recorded information has been reviewed and is accurate.      Nicole HutchingBrian Nitisha Civello, MD 01/06/15 [phone]

## 2015-01-06 NOTE — ED Notes (Addendum)
N/V started last night, denies today; HA last night same time on left side and per husband pt stated that she could not see out of left eye and pt states that she was" not making any sense", pt denies any weakness. Pt and pt's husband are poor historians on events that occurred, left grip just slightly weaker than right, pt able to state birthdate but very slow on stating month and unable to state what year

## 2015-01-06 NOTE — ED Notes (Signed)
Patient with no complaints at this time. Respirations even and unlabored. Skin warm/dry. Discharge instructions reviewed with patient at this time. Patient given opportunity to voice concerns/ask questions. IV removed per policy and band-aid applied to site. Patient discharged at this time and left Emergency Department with steady gait.  

## 2015-01-11 DIAGNOSIS — Z6831 Body mass index (BMI) 31.0-31.9, adult: Secondary | ICD-10-CM | POA: Diagnosis not present

## 2015-01-11 DIAGNOSIS — I10 Essential (primary) hypertension: Secondary | ICD-10-CM | POA: Diagnosis not present

## 2015-01-11 DIAGNOSIS — G459 Transient cerebral ischemic attack, unspecified: Secondary | ICD-10-CM | POA: Diagnosis not present

## 2015-01-11 DIAGNOSIS — Z79899 Other long term (current) drug therapy: Secondary | ICD-10-CM | POA: Diagnosis not present

## 2015-01-19 ENCOUNTER — Other Ambulatory Visit (HOSPITAL_COMMUNITY): Payer: Self-pay | Admitting: Internal Medicine

## 2015-01-19 DIAGNOSIS — R519 Headache, unspecified: Secondary | ICD-10-CM

## 2015-01-19 DIAGNOSIS — G459 Transient cerebral ischemic attack, unspecified: Secondary | ICD-10-CM

## 2015-01-19 DIAGNOSIS — R51 Headache: Secondary | ICD-10-CM

## 2015-01-25 ENCOUNTER — Ambulatory Visit (HOSPITAL_COMMUNITY)
Admission: RE | Admit: 2015-01-25 | Discharge: 2015-01-25 | Disposition: A | Discharge status: Home / Self Care | Payer: Medicare Other | Source: Ambulatory Visit | Attending: Internal Medicine | Admitting: Internal Medicine

## 2015-01-25 DIAGNOSIS — R51 Headache: Secondary | ICD-10-CM | POA: Insufficient documentation

## 2015-01-25 DIAGNOSIS — R110 Nausea: Secondary | ICD-10-CM | POA: Insufficient documentation

## 2015-01-25 DIAGNOSIS — G459 Transient cerebral ischemic attack, unspecified: Secondary | ICD-10-CM | POA: Insufficient documentation

## 2015-01-25 DIAGNOSIS — R519 Headache, unspecified: Secondary | ICD-10-CM

## 2015-01-29 ENCOUNTER — Encounter: Payer: Self-pay | Admitting: Cardiology

## 2015-01-29 ENCOUNTER — Ambulatory Visit (INDEPENDENT_AMBULATORY_CARE_PROVIDER_SITE_OTHER): Payer: Medicare Other | Admitting: Cardiology

## 2015-01-29 VITALS — BP 146/83 | HR 78 | Ht 64.0 in | Wt 171.0 lb

## 2015-01-29 DIAGNOSIS — R002 Palpitations: Secondary | ICD-10-CM

## 2015-01-29 DIAGNOSIS — G459 Transient cerebral ischemic attack, unspecified: Secondary | ICD-10-CM

## 2015-01-29 NOTE — Patient Instructions (Signed)
Your physician recommends that you schedule a follow-up appointment in: 1 month with Joni ReiningKathryn lawrence NP  Your physician has requested that you have an echocardiogram. Echocardiography is a painless test that uses sound waves to create images of your heart. It provides your doctor with information about the size and shape of your heart and how well your heart's chambers and valves are working. This procedure takes approximately one hour. There are no restrictions for this procedure.    Your physician has recommended that you wear an event monitor for 2 weeks. Event monitors are medical devices that record the heart's electrical activity. Doctors most often us these monitors to diagnose arrhythmias. Arrhythmias are problems with the speed or rhythm of the heartbeat. The monitor is a small, portable device. You can wear one while you do your normal daily activities. This is usually used to diagnose what is causing palpitations/syncope (passing out).     Thank you for choosing [facility] !

## 2015-01-29 NOTE — Progress Notes (Signed)
Clinical Summary Nicole Cobb is a 74 y.o.female seen today as a new patient for the following medical problems.   1. TIA - 01/05/2015 episode of blurry vision, slurred speech. Referred by pcp to evaluate possible cardiac etiology. Has upcoming with neuro as well.  - does have feeling of palpitaitons frequently, she associates them with "panic attacks".     Past Medical History  Diagnosis Date  . Hiatal hernia   . Anxiety   . Hypokalemia   . BPV (benign positional vertigo)   . Anemia   . Hyperlipidemia   . HTN (hypertension)   . Vertigo      No Known Allergies   Current Outpatient Prescriptions  Medication Sig Dispense Refill  . ALPRAZolam (XANAX) 0.5 MG tablet Take 1 tablet (0.5 mg total) by mouth 2 (two) times daily as needed for sleep or anxiety. (Patient taking differently: Take 0.5 mg by mouth 2 (two) times daily. ) 30 tablet 0  . aspirin 81 MG tablet Take 81 mg by mouth daily.    [name] atenolol (TENORMIN) 50 MG tablet Take 1 tablet (50 mg total) by mouth daily. 30 tablet 3  . atorvastatin (LIPITOR) 40 MG tablet Take 1 tablet by mouth at bedtime.     . cetirizine (ZYRTEC) 10 MG tablet Take 10 mg by mouth daily as needed for allergies.     . Cholecalciferol (D3-1000 PO) Take 2,000 mg by mouth daily.     [name] co-enzyme Q-10 50 MG capsule Take 100 mg by mouth at bedtime.    [name] loratadine (CLARITIN) 10 MG tablet Take 10 mg by mouth daily.    . meclizine (ANTIVERT) 25 MG tablet Take 1 tablet (25 mg total) by mouth 3 (three) times daily as needed for dizziness or nausea. (Patient not taking: Reported on 01/06/2015) 40 tablet 0  . Misc Natural Products (LUTEIN 20 PO) Take 20 mg by mouth daily.    . Omega-3 Fatty Acids (OMEGA 3 PO) Take 1 tablet by mouth daily.    [name] omeprazole (PRILOSEC) 20 MG capsule Take 20 mg by mouth daily.    . potassium chloride SA (K-DUR,KLOR-CON) 20 MEQ tablet Take 0.5 tablets (10 mEq total) by mouth daily. (Patient taking differently: Take 20 mEq by mouth  at bedtime. )    . promethazine (PHENERGAN) 12.5 MG tablet Take 2 tablets (25 mg total) by mouth every 6 (six) hours as needed for nausea. 30 tablet 0  . vitamin B-12 (CYANOCOBALAMIN) 500 MCG tablet Take 2,000 mcg by mouth daily.      No current facility-administered medications for this visit.     Past Surgical History  Procedure Laterality Date  . Tubal ligation       No Known Allergies    No family history on file.   Social History Nicole Cobb reports that she has never smoked. She does not have any smokeless tobacco history on file. Nicole Cobb reports that she does not drink alcohol.   Review of Systems CONSTITUTIONAL: No weight loss, fever, chills, weakness or fatigue.  HEENT: Eyes: No visual loss, blurred vision, double vision or yellow sclerae.No hearing loss, sneezing, congestion, runny nose or sore throat.  SKIN: No rash or itching.  CARDIOVASCULAR: per HPI RESPIRATORY: No shortness of breath, cough or sputum.  GASTROINTESTINAL: No anorexia, nausea, vomiting or diarrhea. No abdominal pain or blood.  GENITOURINARY: No burning on urination, no polyuria NEUROLOGICAL: No headache, dizziness, syncope, paralysis, ataxia, numbness or tingling in the extremities. No change  in bowel or bladder control.  MUSCULOSKELETAL: No muscle, back pain, joint pain or stiffness.  LYMPHATICS: No enlarged nodes. No history of splenectomy.  PSYCHIATRIC: No history of depression or anxiety.  ENDOCRINOLOGIC: No reports of sweating, cold or heat intolerance. No polyuria or polydipsia.  [name].   Physical Examination p 78 bp 146/83 Wt 171 lbs BMI 29 Gen: resting comfortably, no acute distress HEENT: no scleral icterus, pupils equal round and reactive, no palptable cervical adenopathy,  CV: RRR, no m/r/g, no JVD Resp: Clear to auscultation bilaterally GI: abdomen is soft, non-tender, non-distended, normal bowel sounds, no hepatosplenomegaly MSK: extremities are warm, no edema.  Skin: warm,  no rash Neuro:  no focal deficits Psych: appropriate affect   Assessment and Plan  1. TIA - will eval from cardiac standpoint. Notes history of palpitaitons, will obtain 2 week monitor to eval for afib/afllutter. Will obtain echo. Further workup per neuro and pcp. Secondary prevention with plavix, atorva, losartan. Defer decision on DAPT to neuro, no indication from cardiac standpoint.     F/u 1 month  Nicole Cobb, M.D..

## 2015-01-30 ENCOUNTER — Ambulatory Visit (HOSPITAL_COMMUNITY)
Admission: RE | Admit: 2015-01-30 | Discharge: 2015-01-30 | Disposition: A | Discharge status: Home / Self Care | Payer: Medicare Other | Source: Ambulatory Visit | Attending: Cardiology | Admitting: Cardiology

## 2015-01-30 DIAGNOSIS — G459 Transient cerebral ischemic attack, unspecified: Secondary | ICD-10-CM

## 2015-01-30 DIAGNOSIS — I6789 Other cerebrovascular disease: Secondary | ICD-10-CM

## 2015-01-30 DIAGNOSIS — I639 Cerebral infarction, unspecified: Secondary | ICD-10-CM | POA: Diagnosis not present

## 2015-01-30 DIAGNOSIS — I10 Essential (primary) hypertension: Secondary | ICD-10-CM | POA: Insufficient documentation

## 2015-01-30 NOTE — Progress Notes (Signed)
  Echocardiogram 2D Echocardiogram has been performed.  Aerilynn Goin FRANCES 01/30/2015, 11:08 AM

## 2015-02-23 ENCOUNTER — Ambulatory Visit (INDEPENDENT_AMBULATORY_CARE_PROVIDER_SITE_OTHER): Payer: Medicare Other | Admitting: Adult Health

## 2015-02-23 ENCOUNTER — Encounter: Payer: Self-pay | Admitting: Adult Health

## 2015-02-23 VITALS — BP 160/92 | HR 79 | Ht 64.0 in | Wt 172.4 lb

## 2015-02-23 DIAGNOSIS — R002 Palpitations: Secondary | ICD-10-CM

## 2015-02-23 DIAGNOSIS — I10 Essential (primary) hypertension: Secondary | ICD-10-CM

## 2015-02-23 NOTE — Patient Instructions (Signed)
Your physician wants you to follow-up in: 6 months with Nicole PhoK Lawrence NP You will receive a reminder letter in the mail two months in advance. If you don't receive a letter, please call our office to schedule the follow-up appointment.     Your physician recommends that you continue on your current medications as directed. Please refer to the Current Medication list given to you today.

## 2015-02-23 NOTE — Progress Notes (Deleted)
Name: Nicole GlazierLinda W Cobb    DOB: 1941-08-08  Age: 74 y.o.  MR#: [id number]       PCP:  Cassell SmilesFUSCO,LAWRENCE J., MD      Insurance: Payor: [employer] / [facility]: [facility] / Product Type: *No Product type* /   CC:    Chief Complaint  Patient presents with  . Hypertension  . Transient Ischemic Attack    VS Filed Vitals:   02/23/15 1316  BP: 160/92  Pulse: 79  Height: 5\' 4"  (1.626 m)  Weight: 172 lb 6.4 oz (78.2 kg)  SpO2: 99%    Weights Current Weight  02/23/15 172 lb 6.4 oz (78.2 kg)  01/29/15 171 lb (77.565 kg)  01/25/15 169 lb (76.658 kg)    Blood Pressure  BP Readings from Last 3 Encounters:  02/23/15 160/92  01/29/15 146/83  01/06/15 151/86     Admit date:  (Not on file) Last encounter with RMR:  Visit date not found   Allergy Review of patient's allergies indicates no known allergies.  Current Outpatient Prescriptions  Medication Sig Dispense Refill  . ALPRAZolam (XANAX) 0.5 MG tablet Take 1 tablet (0.5 mg total) by mouth 2 (two) times daily as needed for sleep or anxiety. (Patient taking differently: Take 0.5 mg by mouth 2 (two) times daily. ) 30 tablet 0  . aspirin 81 MG tablet Take 81 mg by mouth daily.    [name]. atenolol (TENORMIN) 50 MG tablet Take 1 tablet (50 mg total) by mouth daily. 30 tablet 3  . atorvastatin (LIPITOR) 40 MG tablet Take 1 tablet by mouth at bedtime.     . cetirizine (ZYRTEC) 10 MG tablet Take 10 mg by mouth daily as needed for allergies.     . Cholecalciferol (D3-1000 PO) Take 2,000 mg by mouth daily.     . clopidogrel (PLAVIX) 75 MG tablet Take 75 mg by mouth daily.    [name]. co-enzyme Q-10 50 MG capsule Take 100 mg by mouth at bedtime.    . fluticasone (FLONASE) 50 MCG/ACT nasal spray Place 1 spray into both nostrils daily.    [name]. loratadine (CLARITIN) 10 MG tablet Take 10 mg by mouth daily.    [name]. losartan (COZAAR) 25 MG tablet Take 25 mg by mouth daily.    . Lutein 20 MG CAPS Take 20 mg by mouth daily.    . Misc Natural Products (LUTEIN  20 PO) Take 20 mg by mouth daily.    . Omega-3 Fatty Acids (OMEGA 3 PO) Take 1 tablet by mouth daily.    [name]. omeprazole (PRILOSEC) 20 MG capsule Take 20 mg by mouth daily.    . potassium chloride SA (K-DUR,KLOR-CON) 20 MEQ tablet Take 0.5 tablets (10 mEq total) by mouth daily. (Patient taking differently: Take 20 mEq by mouth at bedtime. )     No current facility-administered medications for this visit.    Discontinued Meds:    Medications Discontinued During This Encounter  Medication Reason  . meclizine (ANTIVERT) 25 MG tablet Error    Patient Active Problem List   Diagnosis Date Noted  . Dysphasia 01/06/2015  . HTN (hypertension), benign 07/16/2014  . Hyponatremia 07/15/2014  . Nausea and vomiting 07/15/2014  . Gastroenteritis 07/15/2014  . Hypokalemia 07/15/2014  . Elevated LFTs 07/15/2014  . Generalized weakness 07/15/2014  . Vertigo 09/01/2013  . Nausea with vomiting 09/01/2013  . Essential hypertension, benign 09/01/2013  . Anxiety state, unspecified 09/01/2013    LABS    Component Value Date/Time  NA 135 01/06/2015 1057   NA 132* 07/17/2014 0608   NA 122* 07/16/2014 1508   K 3.9 01/06/2015 1057   K 4.4 07/17/2014 0608   K 4.3 07/16/2014 1508   CL 102 01/06/2015 1057   CL 97 07/17/2014 0608   CL 89* 07/16/2014 1508   CO2 23 01/06/2015 1057   CO2 24 07/17/2014 0608   CO2 21 07/16/2014 1508   GLUCOSE 126* 01/06/2015 1057   GLUCOSE 118* 07/17/2014 0608   GLUCOSE 118* 07/16/2014 1508   BUN 12 01/06/2015 1057   BUN 10 07/17/2014 0608   BUN 6 07/16/2014 1508   CREATININE 0.58 01/06/2015 1057   CREATININE 0.70 07/17/2014 0608   CREATININE 0.56 07/16/2014 1508   CALCIUM 8.8 01/06/2015 1057   CALCIUM 9.0 07/17/2014 0608   CALCIUM 8.6 07/16/2014 1508   GFRNONAA >90 01/06/2015 1057   GFRNONAA 85* 07/17/2014 0608   GFRNONAA >90 07/16/2014 1508   GFRAA >90 01/06/2015 1057   GFRAA >90 07/17/2014 0608   GFRAA >90 07/16/2014 1508   CMP     Component Value  Date/Time   NA 135 01/06/2015 1057   K 3.9 01/06/2015 1057   CL 102 01/06/2015 1057   CO2 23 01/06/2015 1057   GLUCOSE 126* 01/06/2015 1057   BUN 12 01/06/2015 1057   CREATININE 0.58 01/06/2015 1057   CALCIUM 8.8 01/06/2015 1057   PROT 7.1 01/06/2015 1057   ALBUMIN 3.9 01/06/2015 1057   AST 43* 01/06/2015 1057   ALT 45* 01/06/2015 1057   ALKPHOS 96 01/06/2015 1057   BILITOT 0.9 01/06/2015 1057   GFRNONAA >90 01/06/2015 1057   GFRAA >90 01/06/2015 1057       Component Value Date/Time   WBC 11.5* 01/06/2015 1057   WBC 8.1 07/16/2014 0358   WBC 11.3* 07/15/2014 1102   HGB 13.6 01/06/2015 1057   HGB 12.6 07/16/2014 0358   HGB 13.7 07/15/2014 1309   HCT 40.0 01/06/2015 1057   HCT 33.0* 07/16/2014 0358   HCT RESULTS UNAVAILABLE DUE TO INTERFERING SUBSTANCE 07/15/2014 1102   MCV 90.9 01/06/2015 1057   MCV 82.5 07/16/2014 0358   MCV RESULTS UNAVAILABLE DUE TO INTERFERING SUBSTANCE 07/15/2014 1102    Lipid Panel  No results found for: CHOL, TRIG, HDL, CHOLHDL, VLDL, LDLCALC, LDLDIRECT  ABG No results found for: PHART, PCO2ART, PO2ART, HCO3, TCO2, ACIDBASEDEF, O2SAT   Lab Results  Component Value Date   TSH 1.270 07/15/2014   BNP (last 3 results) No results for input(s): BNP in the last 8760 hours.  ProBNP (last 3 results) No results for input(s): PROBNP in the last 8760 hours.  Cardiac Panel (last 3 results) No results for input(s): CKTOTAL, CKMB, TROPONINI, RELINDX in the last 72 hours.  Iron/TIBC/Ferritin/ %Sat    Component Value Date/Time   IRON 80 07/15/2014 1600   TIBC 312 07/15/2014 1600   FERRITIN 269 07/15/2014 1600   IRONPCTSAT 26 07/15/2014 1600     EKG Orders placed or performed in visit on 01/29/15  . Cardiac event monitor     Prior Assessment and Plan Problem List as of 02/23/2015      Cardiovascular and Mediastinum   Essential hypertension, benign   HTN (hypertension), benign     Digestive   Nausea with vomiting   Nausea and vomiting    Gastroenteritis     Other   Vertigo   Anxiety state, unspecified   Hyponatremia   Hypokalemia   Elevated LFTs   Generalized weakness   Dysphasia  Imaging: Mr Brain Wo Contrast  01/25/2015   CLINICAL DATA:  Transient cerebral ischemia, unspecified. Headache, unspecified. Episode of unusual behavior, headache, and nausea.  EXAM: MRI HEAD WITHOUT CONTRAST  TECHNIQUE: Multiplanar, multiecho pulse sequences of the brain and surrounding structures were obtained without intravenous contrast.  COMPARISON:  CT head without contrast 01/06/2015  FINDINGS: The diffusion-weighted images demonstrate no evidence for acute or subacute infarction. Acute hemorrhage or mass lesion is present. Mild periventricular and subcortical white matter changes bilaterally are slightly greater than expected. A remote lacunar infarct is present in the left cerebellum. Flow is present in the major intracranial arteries. The globes and orbits are intact. The paranasal sinuses and the mastoid air cells are clear. Midline structures are within normal limits.  IMPRESSION: 1. No acute or focal lesion to explain the patient's symptoms. 2. Mild periventricular and subcortical white matter changes bilaterally a greater than expected for age. This likely reflects the sequela of chronic microvascular ischemia. 3. Remote lacunar infarct of the left cerebellum.   Electronically Signed   By: Gennette Pac M.D.   On: 01/25/2015 15:27

## 2015-02-23 NOTE — Progress Notes (Signed)
Cardiology Office Note   Date:  02/23/2015   ID:  DENZIL BRISTOL, DOB Dec 03, 1941, MRN [id number]  PCP:  Cassell Smiles., MD  Cardiologist: Arlington Calix, NP   Chief Complaint  Patient presents with  . Hypertension  . Transient Ischemic Attack      History of Present Illness: Nicole Cobb is a 74 y.o. female who presents for ongoing assessment and management of hypertension, with history of hyperlipidemia, and TIA.  She was last seen by Dr. Wyline Mood on 01/29/2015, with complaints of palpitations.  Echocardiogram was completed along with a two-week monitor to evaluate her rhythm.  Echocardiogram was completed on 01/30/2015, revealing LVEF of 60-65%, grade 1 diastolic dysfunction.  She is very talkative and anxious. She tells me her complete health history including all of her symptoms and worries. She is a little hypertensive today.    Past Medical History  Diagnosis Date  . Hiatal hernia   . Anxiety   . Hypokalemia   . BPV (benign positional vertigo)   . Anemia   . Hyperlipidemia   . HTN (hypertension)   . Vertigo     Past Surgical History  Procedure Laterality Date  . Tubal ligation       Current Outpatient Prescriptions  Medication Sig Dispense Refill  . ALPRAZolam (XANAX) 0.5 MG tablet Take 1 tablet (0.5 mg total) by mouth 2 (two) times daily as needed for sleep or anxiety. (Patient taking differently: Take 0.5 mg by mouth 2 (two) times daily. ) 30 tablet 0  . aspirin 81 MG tablet Take 81 mg by mouth daily.    [name] atenolol (TENORMIN) 50 MG tablet Take 1 tablet (50 mg total) by mouth daily. 30 tablet 3  . atorvastatin (LIPITOR) 40 MG tablet Take 1 tablet by mouth at bedtime.     . cetirizine (ZYRTEC) 10 MG tablet Take 10 mg by mouth daily as needed for allergies.     . Cholecalciferol (D3-1000 PO) Take 2,000 mg by mouth daily.     . clopidogrel (PLAVIX) 75 MG tablet Take 75 mg by mouth daily.    [name] co-enzyme Q-10 50 MG capsule Take 100 mg by mouth at  bedtime.    . fluticasone (FLONASE) 50 MCG/ACT nasal spray Place 1 spray into both nostrils daily.    [name] loratadine (CLARITIN) 10 MG tablet Take 10 mg by mouth daily.    [name] losartan (COZAAR) 25 MG tablet Take 25 mg by mouth daily.    . Lutein 20 MG CAPS Take 20 mg by mouth daily.    . Misc Natural Products (LUTEIN 20 PO) Take 20 mg by mouth daily.    . Omega-3 Fatty Acids (OMEGA 3 PO) Take 1 tablet by mouth daily.    [name] omeprazole (PRILOSEC) 20 MG capsule Take 20 mg by mouth daily.    . potassium chloride SA (K-DUR,KLOR-CON) 20 MEQ tablet Take 0.5 tablets (10 mEq total) by mouth daily. (Patient taking differently: Take 20 mEq by mouth at bedtime. )     No current facility-administered medications for this visit.    Allergies:   Review of patient's allergies indicates no known allergies.    Social History:  The patient  reports that she has never smoked. She has never used smokeless tobacco. She reports that she does not drink alcohol.   Family History:  The patient's family history is not on file.    ROS: .   All other systems are reviewed and negative.Unless otherwise mentioned in H&P  above.   PHYSICAL EXAM: VS:  BP 160/92 mmHg  Pulse 79  Ht 5\' 4"  (1.626 m)  Wt 172 lb 6.4 oz (78.2 kg)  BMI 29.58 kg/m2  SpO2 99% , BMI Body mass index is 29.58 kg/(m^2). GEN: Well nourished, well developed, in no acute distress HEENT: normal Neck: no JVD, carotid bruits, or masses Cardiac: RRR; no murmurs, rubs, or gallops,no edema  Respiratory:  clear to auscultation bilaterally, normal work of breathing GI: soft, nontender, nondistended, + BS MS: no deformity or atrophy Skin: warm and dry, no rash Neuro:  Strength and sensation are intact Psych: euthymic mood, full affect    Recent Labs: 07/15/2014: TSH 1.270 01/06/2015: ALT 45*; BUN 12; Creatinine 0.58; Hemoglobin 13.6; Platelets 251; Potassium 3.9; Sodium 135    Lipid Panel No results found for: CHOL, TRIG, HDL, CHOLHDL, VLDL, LDLCALC,  LDLDIRECT    Wt Readings from Last 3 Encounters:  02/23/15 172 lb 6.4 oz (78.2 kg)  01/29/15 171 lb (77.565 kg)  01/25/15 169 lb (76.658 kg)      Other studies Reviewed: Additional studies/ records that were reviewed today include:  1. Cardiac monitor 2. Echo  Review of the above records demonstrates:  See above.    ASSESSMENT AND PLAN:  1. Palpitations: Cardiac monitor demonstrates NSR without arrhythmia or rapid HR.  Continue atenolol as directed. Will see her again in 6 months.   2. Hypertension: Slightly elevated today due to white coat syndrome and anxiety. Will continue current medications as directed. See her in 6 months.    Current medicines are reviewed at length with the patient today.  .   No orders of the defined types were placed in this encounter.     Disposition:   FU with 6 months   Signed, Joni ReiningKathryn Camyah Pultz, NP  02/23/2015 1:27 PM    [facility] [address], [place], [place] [address] Phone: [phone]; Fax: [phone]

## 2015-02-27 ENCOUNTER — Encounter: Payer: Self-pay | Admitting: *Deleted

## 2015-02-27 ENCOUNTER — Other Ambulatory Visit: Payer: Self-pay | Admitting: *Deleted

## 2015-02-27 DIAGNOSIS — R002 Palpitations: Secondary | ICD-10-CM

## 2015-03-07 ENCOUNTER — Encounter: Payer: Self-pay | Admitting: Neurology

## 2015-03-07 ENCOUNTER — Ambulatory Visit (INDEPENDENT_AMBULATORY_CARE_PROVIDER_SITE_OTHER): Payer: Medicare Other | Admitting: Neurology

## 2015-03-07 VITALS — BP 140/82 | HR 72 | Temp 98.1°F | Resp 16 | Ht 64.0 in | Wt 173.4 lb

## 2015-03-07 DIAGNOSIS — G459 Transient cerebral ischemic attack, unspecified: Secondary | ICD-10-CM

## 2015-03-07 NOTE — Progress Notes (Signed)
NEUROLOGY CONSULTATION NOTE  Nicole Cobb MRN: [id number] DOB: 09-Oct-1941  Referring provider: Dr. Sherwood Gambler Primary care provider: Dr. Sherwood Gambler  Reason for consult:  TIA  HISTORY OF PRESENT ILLNESS: Nicole Cobb is a 74 year old right-handed woman with history of vertigo, hypertension, hyperlipidemia, anemia and history of hyponatremia who presents for TIA.  Records, labs, CT and MRI reviewed.  She is accompanied by her husband who provides some history.  On 01/05/15, she was sitting at home reading that evening.  The details are vague.  Her husband said that she wasn't acting right and wasn't making sense.  He is unable to elaborate on this statement.  She was not slurring her speech or exhibited facial droop, however.  She says that she remembers feeling like she was in a dream and remembers the events.  She feels that she had probably fallen asleep and seemed confused because she had just woken up when her husband was addressing her.  Later that night, she became nauseous and started vomiting.  She denied headache.  She saw her PCP the next day who told her to go to the ED.  CT of the head showed no acute infarct or bleed.  WBC mildly elevated at 11.5 with mildly elevated LFTs but overall labs were unremarkable.  UA negative for infection.  EKG showed a sinus rhythm.  She saw cardiology due to episodes of palpitations which she attributes to panic attacks.  2D echo performed 01/30/15 was unremarkable with EF 60-65%.  Cardiac monitor was unremarkable.  MRI of the brain performed on 01/25/15 showed small vessel ischemic changes but nothing acute.  She was started on Plavix in addition to her ASA .  Since then, she has not had any further events.  PAST MEDICAL HISTORY: Past Medical History  Diagnosis Date  . Hiatal hernia   . Anxiety   . Hypokalemia   . BPV (benign positional vertigo)   . Anemia   . Hyperlipidemia   . HTN (hypertension)   . Vertigo     PAST SURGICAL HISTORY: Past  Surgical History  Procedure Laterality Date  . Tubal ligation      MEDICATIONS: Current Outpatient Prescriptions on File Prior to Visit  Medication Sig Dispense Refill  . ALPRAZolam (XANAX) 0.5 MG tablet Take 1 tablet (0.5 mg total) by mouth 2 (two) times daily as needed for sleep or anxiety. (Patient taking differently: Take 0.5 mg by mouth 2 (two) times daily. ) 30 tablet 0  . aspirin 81 MG tablet Take 81 mg by mouth daily.    [name] atenolol (TENORMIN) 50 MG tablet Take 1 tablet (50 mg total) by mouth daily. 30 tablet 3  . atorvastatin (LIPITOR) 40 MG tablet Take 1 tablet by mouth at bedtime.     . cetirizine (ZYRTEC) 10 MG tablet Take 10 mg by mouth daily as needed for allergies.     . Cholecalciferol (D3-1000 PO) Take 2,000 mg by mouth daily.     . clopidogrel (PLAVIX) 75 MG tablet Take 75 mg by mouth daily.    [name] co-enzyme Q-10 50 MG capsule Take 100 mg by mouth at bedtime.    . fluticasone (FLONASE) 50 MCG/ACT nasal spray Place 1 spray into both nostrils daily.    [name] KRILL OIL PO Take 600 mg by mouth daily.    [name] loratadine (CLARITIN) 10 MG tablet Take 10 mg by mouth daily.    [name] losartan (COZAAR) 25 MG tablet Take 25 mg by mouth daily.    [name]  Lutein 20 MG CAPS Take 20 mg by mouth daily.    . Misc Natural Products (LUTEIN 20 PO) Take 20 mg by mouth daily.    . Omega-3 Fatty Acids (OMEGA 3 PO) Take 1 tablet by mouth daily.    [name] omeprazole (PRILOSEC) 20 MG capsule Take 20 mg by mouth daily.    . potassium chloride SA (K-DUR,KLOR-CON) 20 MEQ tablet Take 0.5 tablets (10 mEq total) by mouth daily. (Patient taking differently: Take 20 mEq by mouth at bedtime. )     No current facility-administered medications on file prior to visit.    ALLERGIES: No Known Allergies  FAMILY HISTORY: Family History  Problem Relation Age of Onset  . Diabetes Mother   . Cancer Mother     breast   . Cancer Father     lymphoma  . Heart failure Mother   . Heart failure Brother   . Heart attack Brother   .  Cancer Brother     stomach    SOCIAL HISTORY: History   Social History  . Marital Status: Married    Spouse Name: N/A  . Number of Children: N/A  . Years of Education: N/A   Occupational History  . Not on file.   Social History Main Topics  . Smoking status: Never Smoker   . Smokeless tobacco: Never Used  . Alcohol Use: No  . Drug Use: No  . Sexual Activity:    Partners: Male   Other Topics Concern  . Not on file   Social History Narrative    REVIEW OF SYSTEMS: Constitutional: No fevers, chills, or sweats, no generalized fatigue, change in appetite Eyes: No visual changes, double vision, eye pain Ear, nose and throat: No hearing loss, ear pain, nasal congestion, sore throat Cardiovascular: No chest pain, palpitations Respiratory:  No shortness of breath at rest or with exertion, wheezes GastrointestinaI: No nausea, vomiting, diarrhea, abdominal pain, fecal incontinence Genitourinary:  No dysuria, urinary retention or frequency Musculoskeletal:  No neck pain, back pain Integumentary: No rash, pruritus, skin lesions Neurological: as above Psychiatric: No depression, insomnia, anxiety Endocrine: No palpitations, fatigue, diaphoresis, mood swings, change in appetite, change in weight, increased thirst Hematologic/Lymphatic:  No anemia, purpura, petechiae. Allergic/Immunologic: no itchy/runny eyes, nasal congestion, recent allergic reactions, rashes  PHYSICAL EXAM: Filed Vitals:   03/07/15 1431  BP: 140/82  Pulse: 72  Temp: 98.1 F (36.7 C)  Resp: 16   General: No acute distress Head:  Normocephalic/atraumatic Eyes:  fundi unremarkable, without vessel changes, exudates, hemorrhages or papilledema. Neck: supple, no paraspinal tenderness, full range of motion Back: No paraspinal tenderness Heart: regular rate and rhythm Lungs: Clear to auscultation bilaterally. Vascular: No carotid bruits. Neurological Exam: Mental status: alert and oriented to person, place,  and time, recent and remote memory intact, fund of knowledge intact, attention and concentration intact, speech fluent and not dysarthric, language intact. Cranial nerves: CN I: not tested CN II: pupils equal, round and reactive to light, visual fields intact, fundi unremarkable, without vessel changes, exudates, hemorrhages or papilledema. CN III, IV, VI:  full range of motion, no nystagmus, no ptosis CN V: facial sensation intact CN VII: upper and lower face symmetric CN VIII: hearing intact CN IX, X: gag intact, uvula midline CN XI: sternocleidomastoid and trapezius muscles intact CN XII: tongue midline Bulk & Tone: normal, no fasciculations. Motor:  5/5 throughout Sensation:  Temperature and vibration intact Deep Tendon Reflexes:  2+ throughout, toes downgoing Finger to nose testing:  No dysmetria  Gait:  Mildly wide-based gait.  Able to turn and walk in tandem. Romberg negative.  IMPRESSION: History and description of symptoms are vague but we can call it a transient ischemic attack only because we have no alternative explanation.  PLAN: 1.  For purposes of secondary stroke prevention, she may discontinue ASA 81mg  and continue Plavix 75mg  daily. 2.  Will check carotid doppler 3.  She is on a statin.  LDL goal should be less than 100. 4.  Follow up in 3 months.  Thank you for allowing me to take part in the care of this patient.  Shon MilletAdam Aujanae Mccullum, DO  CC:  Elfredia NevinsLawrence Fusco, MD

## 2015-03-07 NOTE — Patient Instructions (Addendum)
I can't say for sure that it was a small stroke, but we sometimes give that diagnosis if we are not sure what happened. 1.  To complete the workup, we will get carotid ultrasound. We have you scheduled for your Carotid Doppler on 03/12/15 at 12:00 pm. Please arrive 15 minutes prior and go to [facility]. If this is not a good date/time please call [phone] to reschedule.  2.  From a standpoint of secondary stroke prevention, you may stay on Plavix alone and stop aspirin. 3.  Follow up in 3 months.

## 2015-03-12 ENCOUNTER — Ambulatory Visit (HOSPITAL_COMMUNITY)
Admission: RE | Admit: 2015-03-12 | Discharge: 2015-03-12 | Disposition: A | Discharge status: Home / Self Care | Payer: Medicare Other | Source: Ambulatory Visit | Attending: Neurology | Admitting: Neurology

## 2015-03-12 DIAGNOSIS — G459 Transient cerebral ischemic attack, unspecified: Secondary | ICD-10-CM | POA: Diagnosis not present

## 2015-03-12 DIAGNOSIS — I6523 Occlusion and stenosis of bilateral carotid arteries: Secondary | ICD-10-CM | POA: Insufficient documentation

## 2015-03-12 NOTE — Progress Notes (Signed)
VASCULAR LAB PRELIMINARY  PRELIMINARY  PRELIMINARY  PRELIMINARY  Carotid duplex completed.    Preliminary report:  Bilateral:  1-39% ICA stenosis.  Vertebral artery flow is antegrade.     Tandre Conly, RVS 03/12/2015, 1:58 PM

## 2015-03-13 DIAGNOSIS — G459 Transient cerebral ischemic attack, unspecified: Secondary | ICD-10-CM | POA: Insufficient documentation

## 2015-03-14 ENCOUNTER — Telehealth: Payer: Self-pay | Admitting: *Deleted

## 2015-03-14 NOTE — Telephone Encounter (Signed)
Patient is aware that doppler is unremarkable

## 2015-03-14 NOTE — Telephone Encounter (Signed)
-----   Message from Drema DallasAdam R Jaffe, DO sent at 03/13/2015  2:48 PM EDT ----- Carotid doppler is unremarkable ----- Message -----    From: [facility] Interface    Sent: 03/13/2015   1:35 PM      To: Drema DallasAdam R Jaffe, DO

## 2015-06-14 ENCOUNTER — Encounter: Payer: Self-pay | Admitting: Neurology

## 2015-06-14 ENCOUNTER — Ambulatory Visit (INDEPENDENT_AMBULATORY_CARE_PROVIDER_SITE_OTHER): Payer: Medicare Other | Admitting: Neurology

## 2015-06-14 VITALS — BP 140/70 | HR 70 | Resp 18 | Ht 64.0 in | Wt 172.6 lb

## 2015-06-14 DIAGNOSIS — G459 Transient cerebral ischemic attack, unspecified: Secondary | ICD-10-CM

## 2015-06-14 NOTE — Patient Instructions (Signed)
Continue Plavix 75mg  daily Continue cholesterol medication Follow up as needed.

## 2015-06-14 NOTE — Progress Notes (Signed)
NEUROLOGY FOLLOW UP OFFICE NOTE  Nicole Cobb [id number]  HISTORY OF PRESENT ILLNESS: Nicole Cobb is a 74 year old right-handed woman with history of vertigo, hypertension, hyperlipidemia, anemia and history of hyponatremia who follows up for TIA.  Carotid doppler report reviewed.  She is accompanied by her husband who provides some history.  UPDATE: She is taking Plavix for secondary stroke prevention.  She is also on a statin. Carotid doppler from 03/12/15 showed less than 40% bilateral ICA stenosis. She is doing well.  No new problems.  She is reporting less stress.  HISTORY: On 01/05/15, she was sitting at home reading that evening.  The details are vague.  Her husband said that she wasn't acting right and wasn't making sense.  He is unable to elaborate on this statement.  She was not slurring her speech or exhibited facial droop, however.  She says that she remembers feeling like she was in a dream and remembers the events.  She feels that she had probably fallen asleep and seemed confused because she had just woken up when her husband was addressing her.  Later that night, she became nauseous and started vomiting.  She denied headache.  She saw her PCP the next day who told her to go to the ED.  CT of the head showed no acute infarct or bleed.  WBC mildly elevated at 11.5 with mildly elevated LFTs but overall labs were unremarkable.  UA negative for infection.  EKG showed a sinus rhythm.  She saw cardiology due to episodes of palpitations which she attributes to panic attacks.  2D echo performed 01/30/15 was unremarkable with EF 60-65%.  Cardiac monitor was unremarkable.  MRI of the brain performed on 01/25/15 showed small vessel ischemic changes but nothing acute.  She was started on Plavix in addition to her ASA 81mg .  PAST MEDICAL HISTORY: Past Medical History  Diagnosis Date  . Hiatal hernia   . Anxiety   . Hypokalemia   . BPV (benign positional vertigo)   . Anemia   .  Hyperlipidemia   . HTN (hypertension)   . Vertigo     MEDICATIONS: Current Outpatient Prescriptions on File Prior to Visit  Medication Sig Dispense Refill  . ALPRAZolam (XANAX) 0.5 MG tablet Take 1 tablet (0.5 mg total) by mouth 2 (two) times daily as needed for sleep or anxiety. (Patient taking differently: Take 0.5 mg by mouth 2 (two) times daily. ) 30 tablet 0  . aspirin 81 MG tablet Take 81 mg by mouth daily.    [name] atenolol (TENORMIN) 50 MG tablet Take 1 tablet (50 mg total) by mouth daily. 30 tablet 3  . atorvastatin (LIPITOR) 40 MG tablet Take 1 tablet by mouth at bedtime.     . cetirizine (ZYRTEC) 10 MG tablet Take 10 mg by mouth daily as needed for allergies.     . Cholecalciferol (D3-1000 PO) Take 2,000 mg by mouth daily.     . clopidogrel (PLAVIX) 75 MG tablet Take 75 mg by mouth daily.    [name] co-enzyme Q-10 50 MG capsule Take 100 mg by mouth at bedtime.    . fluticasone (FLONASE) 50 MCG/ACT nasal spray Place 1 spray into both nostrils daily.    [name] KRILL OIL PO Take 600 mg by mouth daily.    [name] loratadine (CLARITIN) 10 MG tablet Take 10 mg by mouth daily.    [name] losartan (COZAAR) 25 MG tablet Take 25 mg by mouth daily.    . Lutein  20 MG CAPS Take 20 mg by mouth daily.    . Misc Natural Products (LUTEIN 20 PO) Take 20 mg by mouth daily.    . Omega-3 Fatty Acids (OMEGA 3 PO) Take 1 tablet by mouth daily.    [name] omeprazole (PRILOSEC) 20 MG capsule Take 20 mg by mouth daily.    . potassium chloride SA (K-DUR,KLOR-CON) 20 MEQ tablet Take 0.5 tablets (10 mEq total) by mouth daily. (Patient taking differently: Take 20 mEq by mouth at bedtime. )     No current facility-administered medications on file prior to visit.    ALLERGIES: No Known Allergies  FAMILY HISTORY: Family History  Problem Relation Age of Onset  . Diabetes Mother   . Cancer Mother     breast   . Cancer Father     lymphoma  . Heart failure Mother   . Heart failure Brother   . Heart attack Brother   . Cancer Brother      stomach    SOCIAL HISTORY: History   Social History  . Marital Status: Married    Spouse Name: N/A  . Number of Children: N/A  . Years of Education: N/A   Occupational History  . Not on file.   Social History Main Topics  . Smoking status: Never Smoker   . Smokeless tobacco: Never Used  . Alcohol Use: No  . Drug Use: No  . Sexual Activity:    Partners: Male   Other Topics Concern  . Not on file   Social History Narrative    REVIEW OF SYSTEMS: Constitutional: No fevers, chills, or sweats, no generalized fatigue, change in appetite Eyes: No visual changes, double vision, eye pain Ear, nose and throat: No hearing loss, ear pain, nasal congestion, sore throat Cardiovascular: No chest pain, palpitations Respiratory:  No shortness of breath at rest or with exertion, wheezes GastrointestinaI: No nausea, vomiting, diarrhea, abdominal pain, fecal incontinence Genitourinary:  No dysuria, urinary retention or frequency Musculoskeletal:  No neck pain, back pain Integumentary: No rash, pruritus, skin lesions Neurological: as above Psychiatric: No depression, insomnia, anxiety Endocrine: No palpitations, fatigue, diaphoresis, mood swings, change in appetite, change in weight, increased thirst Hematologic/Lymphatic:  No anemia, purpura, petechiae. Allergic/Immunologic: no itchy/runny eyes, nasal congestion, recent allergic reactions, rashes  PHYSICAL EXAM: Filed Vitals:   06/14/15 0934  BP: 140/70  Pulse: 70  Resp: 18   General: No acute distress Head:  Normocephalic/atraumatic Eyes:  Fundoscopic exam unremarkable without vessel changes, exudates, hemorrhages or papilledema. Neck: supple, no paraspinal tenderness, full range of motion Heart:  Regular rate and rhythm Lungs:  Clear to auscultation bilaterally Back: No paraspinal tenderness Neurological Exam: alert and oriented to person, place, and time. Attention span and concentration intact, recent and remote memory  intact, fund of knowledge intact.  Speech fluent and not dysarthric, language intact.  CN II-XII intact. Fundoscopic exam unremarkable without vessel changes, exudates, hemorrhages or papilledema.  Bulk and tone normal, muscle strength 5/5 throughout.  Sensation to light touch, temperature and vibration intact.  Deep tendon reflexes 2+ throughout, toes downgoing.  Finger to nose and heel to shin testing intact.  Gait normal, Romberg negative.  IMPRESSION: Possible TIA  PLAN: Plavix Statin (LDL goal should be less than 100.  Managed by PCP) Follow up as needed. 15 minutes spent face to face with patient, over 50% spent discussing management.  Shon Millet, DO  CC:  Elfredia Nevins, MD

## 2015-07-18 DIAGNOSIS — I10 Essential (primary) hypertension: Secondary | ICD-10-CM | POA: Diagnosis not present

## 2015-07-18 DIAGNOSIS — Z6830 Body mass index (BMI) 30.0-30.9, adult: Secondary | ICD-10-CM | POA: Diagnosis not present

## 2015-07-18 DIAGNOSIS — Z0001 Encounter for general adult medical examination with abnormal findings: Secondary | ICD-10-CM | POA: Diagnosis not present

## 2015-07-18 DIAGNOSIS — K219 Gastro-esophageal reflux disease without esophagitis: Secondary | ICD-10-CM | POA: Diagnosis not present

## 2015-07-18 DIAGNOSIS — E6609 Other obesity due to excess calories: Secondary | ICD-10-CM | POA: Diagnosis not present

## 2015-07-18 DIAGNOSIS — Z1389 Encounter for screening for other disorder: Secondary | ICD-10-CM | POA: Diagnosis not present

## 2015-07-19 ENCOUNTER — Other Ambulatory Visit (HOSPITAL_COMMUNITY): Payer: Self-pay | Admitting: Internal Medicine

## 2015-07-19 DIAGNOSIS — R945 Abnormal results of liver function studies: Secondary | ICD-10-CM

## 2015-07-19 DIAGNOSIS — R7401 Elevation of levels of liver transaminase levels: Secondary | ICD-10-CM

## 2015-07-19 DIAGNOSIS — R740 Nonspecific elevation of levels of transaminase and lactic acid dehydrogenase [LDH]: Secondary | ICD-10-CM

## 2015-07-23 ENCOUNTER — Ambulatory Visit (HOSPITAL_COMMUNITY)
Admission: RE | Admit: 2015-07-23 | Discharge: 2015-07-23 | Disposition: A | Discharge status: Home / Self Care | Payer: Medicare Other | Source: Ambulatory Visit | Attending: Internal Medicine | Admitting: Internal Medicine

## 2015-07-23 DIAGNOSIS — N2889 Other specified disorders of kidney and ureter: Secondary | ICD-10-CM | POA: Diagnosis not present

## 2015-07-23 DIAGNOSIS — R945 Abnormal results of liver function studies: Secondary | ICD-10-CM

## 2015-07-23 DIAGNOSIS — R740 Nonspecific elevation of levels of transaminase and lactic acid dehydrogenase [LDH]: Secondary | ICD-10-CM

## 2015-07-23 DIAGNOSIS — K760 Fatty (change of) liver, not elsewhere classified: Secondary | ICD-10-CM | POA: Insufficient documentation

## 2015-07-23 DIAGNOSIS — R7989 Other specified abnormal findings of blood chemistry: Secondary | ICD-10-CM | POA: Diagnosis not present

## 2015-07-23 DIAGNOSIS — R934 Abnormal findings on diagnostic imaging of urinary organs: Secondary | ICD-10-CM | POA: Diagnosis not present

## 2015-07-23 DIAGNOSIS — R7401 Elevation of levels of liver transaminase levels: Secondary | ICD-10-CM

## 2015-08-13 ENCOUNTER — Other Ambulatory Visit (HOSPITAL_COMMUNITY): Payer: Self-pay | Admitting: Internal Medicine

## 2015-08-13 DIAGNOSIS — N2889 Other specified disorders of kidney and ureter: Secondary | ICD-10-CM

## 2015-08-21 ENCOUNTER — Encounter: Payer: Self-pay | Admitting: Adult Health

## 2015-08-21 ENCOUNTER — Encounter: Payer: Medicare Other | Admitting: Adult Health

## 2015-08-21 VITALS — BP 138/78 | HR 78 | Ht 64.0 in | Wt 174.8 lb

## 2015-08-21 DIAGNOSIS — E785 Hyperlipidemia, unspecified: Secondary | ICD-10-CM

## 2015-08-21 MED ORDER — PRAVASTATIN SODIUM 40 MG PO TABS: 40.0000 mg | ORAL_TABLET | Freq: Every day | ORAL | Status: DC

## 2015-08-21 NOTE — Patient Instructions (Signed)
Your physician wants you to follow-up in: 6 months with Joni Reining, NP. You will receive a reminder letter in the mail two months in advance. If you don't receive a letter, please call our office to schedule the follow-up appointment.  Your physician has recommended you make the following change in your medication:   STOP TAKING LIPITOR  START TAKING PRAVACHOL 40 MG DAILY  Your physician recommends that you return for lab work in: 3 MONTHS  Thank you for choosing [facility]!

## 2015-08-21 NOTE — Progress Notes (Signed)
Cardiology Office Note   Date:  08/21/2015   ERROR CANCELLED APPT

## 2015-08-21 NOTE — Progress Notes (Signed)
Name: Nicole Cobb    DOB: Jan 23, 1941  Age: 74 y.o.  MR#: [id number]       PCP:  Cassell Smiles., MD      Insurance: Payor: [employer] MEDICARE / Plan: [facility] MEDICARE / Product Type: *No Product type* /   CC:   No chief complaint on file.   VS Filed Vitals:   08/21/15 1443  BP: 138/78  Pulse: 78  Height: 5\' 4"  (1.626 m)  Weight: 174 lb 12.8 oz (79.289 kg)  SpO2: 97%    Weights Current Weight  08/21/15 174 lb 12.8 oz (79.289 kg)  06/14/15 172 lb 9.6 oz (78.291 kg)  03/07/15 173 lb 6.4 oz (78.654 kg)    Blood Pressure  BP Readings from Last 3 Encounters:  08/21/15 138/78  06/14/15 140/70  03/07/15 140/82     Admit date:  (Not on file) Last encounter with RMR:  02/23/2015   Allergy Review of patient's allergies indicates no known allergies.  Current Outpatient Prescriptions  Medication Sig Dispense Refill  . ALPRAZolam (XANAX) 0.5 MG tablet Take 1 tablet (0.5 mg total) by mouth 2 (two) times daily as needed for sleep or anxiety. (Patient taking differently: Take 0.5 mg by mouth 2 (two) times daily. ) 30 tablet 0  . atenolol (TENORMIN) 50 MG tablet Take 1 tablet (50 mg total) by mouth daily. 30 tablet 3  . atorvastatin (LIPITOR) 40 MG tablet Take 1 tablet by mouth at bedtime.     . cetirizine (ZYRTEC) 10 MG tablet Take 10 mg by mouth daily as needed for allergies.     . Cholecalciferol (D3-1000 PO) Take 2,000 mg by mouth daily.     . clopidogrel (PLAVIX) 75 MG tablet Take 75 mg by mouth daily.    [name] co-enzyme Q-10 50 MG capsule Take 100 mg by mouth at bedtime.    . fluticasone (FLONASE) 50 MCG/ACT nasal spray Place 1 spray into both nostrils daily.    [name] KRILL OIL PO Take 600 mg by mouth daily.    [name] loratadine (CLARITIN) 10 MG tablet Take 10 mg by mouth daily.    [name] losartan (COZAAR) 25 MG tablet Take 25 mg by mouth daily.    . Lutein 20 MG CAPS Take 20 mg by mouth daily.    . Misc Natural Products (LUTEIN 20 PO) Take 20 mg by mouth daily.    . Omega-3 Fatty Acids  (OMEGA 3 PO) Take 1 tablet by mouth daily.    [name] omeprazole (PRILOSEC) 20 MG capsule Take 20 mg by mouth daily.     No current facility-administered medications for this visit.    Discontinued Meds:    Medications Discontinued During This Encounter  Medication Reason  . aspirin 81 MG tablet Error  . potassium chloride SA (K-DUR,KLOR-CON) 20 MEQ tablet Error    Patient Active Problem List   Diagnosis Date Noted  . TIA (transient ischemic attack)   . Dysphasia 01/06/2015  . HTN (hypertension), benign 07/16/2014  . Hyponatremia 07/15/2014  . Nausea and vomiting 07/15/2014  . Gastroenteritis 07/15/2014  . Hypokalemia 07/15/2014  . Elevated LFTs 07/15/2014  . Generalized weakness 07/15/2014  . Vertigo 09/01/2013  . Nausea with vomiting 09/01/2013  . Essential hypertension, benign 09/01/2013  . Anxiety state, unspecified 09/01/2013    LABS    Component Value Date/Time   NA 135 01/06/2015 1057   NA 132* 07/17/2014 0608   NA 122* 07/16/2014 1508   K 3.9 01/06/2015 1057   K 4.4  07/17/2014 0608   K 4.3 07/16/2014 1508   CL 102 01/06/2015 1057   CL 97 07/17/2014 0608   CL 89* 07/16/2014 1508   CO2 23 01/06/2015 1057   CO2 24 07/17/2014 0608   CO2 21 07/16/2014 1508   GLUCOSE 126* 01/06/2015 1057   GLUCOSE 118* 07/17/2014 0608   GLUCOSE 118* 07/16/2014 1508   BUN 12 01/06/2015 1057   BUN 10 07/17/2014 0608   BUN 6 07/16/2014 1508   CREATININE 0.58 01/06/2015 1057   CREATININE 0.70 07/17/2014 0608   CREATININE 0.56 07/16/2014 1508   CALCIUM 8.8 01/06/2015 1057   CALCIUM 9.0 07/17/2014 0608   CALCIUM 8.6 07/16/2014 1508   GFRNONAA >90 01/06/2015 1057   GFRNONAA 85* 07/17/2014 0608   GFRNONAA >90 07/16/2014 1508   GFRAA >90 01/06/2015 1057   GFRAA >90 07/17/2014 0608   GFRAA >90 07/16/2014 1508   CMP     Component Value Date/Time   NA 135 01/06/2015 1057   K 3.9 01/06/2015 1057   CL 102 01/06/2015 1057   CO2 23 01/06/2015 1057   GLUCOSE 126* 01/06/2015 1057    BUN 12 01/06/2015 1057   CREATININE 0.58 01/06/2015 1057   CALCIUM 8.8 01/06/2015 1057   PROT 7.1 01/06/2015 1057   ALBUMIN 3.9 01/06/2015 1057   AST 43* 01/06/2015 1057   ALT 45* 01/06/2015 1057   ALKPHOS 96 01/06/2015 1057   BILITOT 0.9 01/06/2015 1057   GFRNONAA >90 01/06/2015 1057   GFRAA >90 01/06/2015 1057       Component Value Date/Time   WBC 11.5* 01/06/2015 1057   WBC 8.1 07/16/2014 0358   WBC 11.3* 07/15/2014 1102   HGB 13.6 01/06/2015 1057   HGB 12.6 07/16/2014 0358   HGB 13.7 07/15/2014 1309   HCT 40.0 01/06/2015 1057   HCT 33.0* 07/16/2014 0358   HCT RESULTS UNAVAILABLE DUE TO INTERFERING SUBSTANCE 07/15/2014 1102   MCV 90.9 01/06/2015 1057   MCV 82.5 07/16/2014 0358   MCV RESULTS UNAVAILABLE DUE TO INTERFERING SUBSTANCE 07/15/2014 1102    Lipid Panel  No results found for: CHOL, TRIG, HDL, CHOLHDL, VLDL, LDLCALC, LDLDIRECT  ABG No results found for: PHART, PCO2ART, PO2ART, HCO3, TCO2, ACIDBASEDEF, O2SAT   Lab Results  Component Value Date   TSH 1.270 07/15/2014   BNP (last 3 results) No results for input(s): BNP in the last 8760 hours.  ProBNP (last 3 results) No results for input(s): PROBNP in the last 8760 hours.  Cardiac Panel (last 3 results) No results for input(s): CKTOTAL, CKMB, TROPONINI, RELINDX in the last 72 hours.  Iron/TIBC/Ferritin/ %Sat    Component Value Date/Time   IRON 80 07/15/2014 1600   TIBC 312 07/15/2014 1600   FERRITIN 269 07/15/2014 1600   IRONPCTSAT 26 07/15/2014 1600     EKG Orders placed or performed in visit on 02/27/15  . Cardiac event monitor     Prior Assessment and Plan Problem List as of 08/21/2015      Cardiovascular and Mediastinum   Essential hypertension, benign   HTN (hypertension), benign   TIA (transient ischemic attack)     Digestive   Nausea with vomiting   Nausea and vomiting   Gastroenteritis     Other   Vertigo   Anxiety state, unspecified   Hyponatremia   Hypokalemia   Elevated  LFTs   Generalized weakness   Dysphasia       Imaging: US Abdomen Complete  07/23/2015   CLINICAL DATA:  74 year old female with  abnormal LFTs for 1 year. Initial encounter.  EXAM: ULTRASOUND ABDOMEN COMPLETE  COMPARISON:  None.  FINDINGS: Gallbladder: No gallstones or wall thickening visualized. No sonographic Murphy sign noted.  Common bile duct: Diameter: 4 mm, normal  Liver: Diffusely increased echogenicity (image 34). No discrete liver lesion. No intrahepatic biliary ductal dilatation identified.  IVC: No abnormality visualized.  Pancreas: Visualized portion unremarkable.  Spleen: Size and appearance within normal limits.  Right Kidney: Length: 10.7 cm. No hydronephrosis. Some renal cortical thinning. Cortical echogenicity is within normal limits. However, at the midpole there is a contour deformity measuring up to 2.2 cm with mixed echogenicity and evidence of some internal vascularity (image 73) suspicious for a small renal mass.  Left Kidney: Length: 11.8 cm. Echogenicity within normal limits. No mass or hydronephrosis visualized.  Abdominal aorta: No aneurysm visualized.  Other findings: None.  IMPRESSION: 1. 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 Recommend followup abdomen MRI without and with contrast (renal mass protocol) to further characterize. This recommendation is adapted from the ACR consensus guidelines: Managing Incidental Findings on Abdominal CT: White Paper of the ACR Incidental Findings Committee. J Am Coll Radiol 2010;7:754-773. 2. Hepatic steatosis.   Electronically Signed   By: Odessa Fleming M.D.   On: 07/23/2015 08:58

## 2015-08-23 ENCOUNTER — Ambulatory Visit (HOSPITAL_COMMUNITY)
Admission: RE | Admit: 2015-08-23 | Discharge: 2015-08-23 | Disposition: A | Discharge status: Home / Self Care | Payer: Medicare Other | Source: Ambulatory Visit | Attending: Internal Medicine | Admitting: Internal Medicine

## 2015-08-23 ENCOUNTER — Ambulatory Visit: Payer: Self-pay | Admitting: Adult Health

## 2015-08-23 DIAGNOSIS — N2889 Other specified disorders of kidney and ureter: Secondary | ICD-10-CM | POA: Insufficient documentation

## 2015-08-23 DIAGNOSIS — N281 Cyst of kidney, acquired: Secondary | ICD-10-CM | POA: Insufficient documentation

## 2015-08-23 DIAGNOSIS — K760 Fatty (change of) liver, not elsewhere classified: Secondary | ICD-10-CM | POA: Diagnosis not present

## 2015-08-23 LAB — POCT I-STAT CREATININE: CREATININE: 0.8 mg/dL (ref 0.44–1.00)

## 2015-08-23 MED ORDER — GADOBENATE DIMEGLUMINE 529 MG/ML IV SOLN
15.0000 mL | Freq: Once | INTRAVENOUS | Status: AC | PRN
  Administered 2015-08-23: 15 mL via INTRAVENOUS

## 2015-11-05 ENCOUNTER — Other Ambulatory Visit: Payer: Self-pay | Admitting: Adult Health

## 2015-11-05 DIAGNOSIS — E785 Hyperlipidemia, unspecified: Secondary | ICD-10-CM | POA: Diagnosis not present

## 2015-11-05 LAB — HEPATIC FUNCTION PANEL
ALBUMIN: 4.2 g/dL (ref 3.6–5.1)
ALK PHOS: 89 U/L (ref 33–130)
ALT: 59 U/L — ABNORMAL HIGH (ref 6–29)
AST: 62 U/L — ABNORMAL HIGH (ref 10–35)
Bilirubin, Direct: 0.2 mg/dL (ref <=0.2)
Indirect Bilirubin: 0.9 mg/dL (ref 0.2–1.2)
Total Bilirubin: 1.1 mg/dL (ref 0.2–1.2)
Total Protein: 7.1 g/dL (ref 6.1–8.1)

## 2015-11-05 LAB — LIPID PANEL
Cholesterol: 176 mg/dL (ref 125–200)
HDL: 38 mg/dL — AB (ref >=46)
LDL CALC: 106 mg/dL (ref <130)
Total CHOL/HDL Ratio: 4.6 Ratio (ref <=5.0)
Triglycerides: 161 mg/dL — ABNORMAL HIGH (ref <150)
VLDL: 32 mg/dL — ABNORMAL HIGH (ref <30)

## 2016-02-18 ENCOUNTER — Ambulatory Visit (INDEPENDENT_AMBULATORY_CARE_PROVIDER_SITE_OTHER): Payer: Medicare Other | Admitting: Cardiology

## 2016-02-18 ENCOUNTER — Encounter: Payer: Self-pay | Admitting: Cardiology

## 2016-02-18 VITALS — BP 140/78 | HR 76 | Ht 61.0 in | Wt 171.0 lb

## 2016-02-18 DIAGNOSIS — R7401 Elevation of levels of liver transaminase levels: Secondary | ICD-10-CM

## 2016-02-18 DIAGNOSIS — E785 Hyperlipidemia, unspecified: Secondary | ICD-10-CM

## 2016-02-18 DIAGNOSIS — R002 Palpitations: Secondary | ICD-10-CM

## 2016-02-18 DIAGNOSIS — G459 Transient cerebral ischemic attack, unspecified: Secondary | ICD-10-CM | POA: Diagnosis not present

## 2016-02-18 DIAGNOSIS — I10 Essential (primary) hypertension: Secondary | ICD-10-CM | POA: Diagnosis not present

## 2016-02-18 DIAGNOSIS — R740 Nonspecific elevation of levels of transaminase and lactic acid dehydrogenase [LDH]: Secondary | ICD-10-CM

## 2016-02-18 NOTE — Progress Notes (Signed)
Patient ID: Nicole Cobb, female   DOB: 12-Dec-1941, 75 y.o.   MRN: [id number]     Clinical Summary Nicole Cobb is a 75 y.o.female seen today as a new patient for the following medical problems.   1. TIA - 01/05/2015 episode of blurry vision, slurred speech. - 01/2015 echo LVEF 60-65%, grade I diastolic dysfunction - 02/2015 event monitor without significant arrhythmias - 02/2015 carotid US bilateral 1-39% stenosis  - no recurrent symptoms since last visit  2. Palpitations - normal monitor 02/2015 - denies any recent symptoms  3. Hyperlipidemia - changed to prava due to elevated LFTs. AST/ALT into 40s at that time,  - 02/2015 changed from lipitor to pravastatin. After change conitnued mild increase into the 60s.  - abd [place] and MRI show fatty liver.   Past Medical History  Diagnosis Date  . Hiatal hernia   . Anxiety   . Hypokalemia   . BPV (benign positional vertigo)   . Anemia   . Hyperlipidemia   . HTN (hypertension)   . Vertigo      No Known Allergies   Current Outpatient Prescriptions  Medication Sig Dispense Refill  . ALPRAZolam (XANAX) 0.5 MG tablet Take 1 tablet (0.5 mg total) by mouth 2 (two) times daily as needed for sleep or anxiety. (Patient taking differently: Take 0.5 mg by mouth 2 (two) times daily. ) 30 tablet 0  . atenolol (TENORMIN) 50 MG tablet Take 1 tablet (50 mg total) by mouth daily. 30 tablet 3  . cetirizine (ZYRTEC) 10 MG tablet Take 10 mg by mouth daily as needed for allergies.     . Cholecalciferol (D3-1000 PO) Take 2,000 mg by mouth daily.     . clopidogrel (PLAVIX) 75 MG tablet Take 75 mg by mouth daily.    [name] co-enzyme Q-10 50 MG capsule Take 100 mg by mouth at bedtime.    . fluticasone (FLONASE) 50 MCG/ACT nasal spray Place 1 spray into both nostrils daily.    [name] KRILL OIL PO Take 600 mg by mouth daily.    [name] loratadine (CLARITIN) 10 MG tablet Take 10 mg by mouth daily.    [name] losartan (COZAAR) 25 MG tablet Take 25 mg by mouth daily.    . Lutein  20 MG CAPS Take 20 mg by mouth daily.    . Misc Natural Products (LUTEIN 20 PO) Take 20 mg by mouth daily.    . Omega-3 Fatty Acids (OMEGA 3 PO) Take 1 tablet by mouth daily.    [name] omeprazole (PRILOSEC) 20 MG capsule Take 20 mg by mouth daily.    . pravastatin (PRAVACHOL) 40 MG tablet Take 1 tablet (40 mg total) by mouth daily. 30 tablet 6   No current facility-administered medications for this visit.     Past Surgical History  Procedure Laterality Date  . Tubal ligation       No Known Allergies    Family History  Problem Relation Age of Onset  . Diabetes Mother   . Cancer Mother     breast   . Cancer Father     lymphoma  . Heart failure Mother   . Heart failure Brother   . Heart attack Brother   . Cancer Brother     stomach     Social History Nicole Cobb reports that she has never smoked. She has never used smokeless tobacco. Nicole Cobb reports that she does not drink alcohol.   Review of Systems CONSTITUTIONAL: No weight loss, fever, chills, weakness  or fatigue.  HEENT: Eyes: No visual loss, blurred vision, double vision or yellow sclerae.No hearing loss, sneezing, congestion, runny nose or sore throat.  SKIN: No rash or itching.  CARDIOVASCULAR: per HPI RESPIRATORY: No shortness of breath, cough or sputum.  GASTROINTESTINAL: No anorexia, nausea, vomiting or diarrhea. No abdominal pain or blood.  GENITOURINARY: No burning on urination, no polyuria NEUROLOGICAL: No headache, dizziness, syncope, paralysis, ataxia, numbness or tingling in the extremities. No change in bowel or bladder control.  MUSCULOSKELETAL: No muscle, back pain, joint pain or stiffness.  LYMPHATICS: No enlarged nodes. No history of splenectomy.  PSYCHIATRIC: No history of depression or anxiety.  ENDOCRINOLOGIC: No reports of sweating, cold or heat intolerance. No polyuria or polydipsia.  [name]   Physical Examination p 78 bp 146/83 Wt 171 lbs BMI 29 Gen: resting comfortably, no acute  distress HEENT: no scleral icterus, pupils equal round and reactive, no palptable cervical adenopathy,  CV: RRR, no m/r/g, no JVD Resp: Clear to auscultation bilaterally GI: abdomen is soft, non-tender, non-distended, normal bowel sounds, no hepatosplenomegaly MSK: extremities are warm, no edema.  Skin: warm, no rash Neuro:  no focal deficits Psych: appropriate affect  02/18/16 clinic EKG: NSR  Assessment and Plan  1. TIA - no evidence of cardiac etiology - no further cardiac testing at this time  2. Palpitations - EKG in clinic shows normal sinus rhythm - normal monitor previously, no recent symptoms - continue current meds  3. Hyperlipideima - mildly elevated AST/ALT that persists after changing lipitor to pravastaitn - will stop prava x 2 months, repeat liver and cholesterol tests at that time.   4. HTN - at goal continue current meds  F/u 6 months  [name], M.D..

## 2016-02-18 NOTE — Patient Instructions (Signed)
Your physician recommends that you schedule a follow-up appointment in:  2 months    STOP Pravastatin     Get fasting lab work in 2 months      Thank you for choosing [facility] !

## 2016-04-15 DIAGNOSIS — E785 Hyperlipidemia, unspecified: Secondary | ICD-10-CM | POA: Diagnosis not present

## 2016-04-16 LAB — COMPREHENSIVE METABOLIC PANEL
ALK PHOS: 82 U/L (ref 33–130)
ALT: 59 U/L — ABNORMAL HIGH (ref 6–29)
AST: 57 U/L — ABNORMAL HIGH (ref 10–35)
Albumin: 4 g/dL (ref 3.6–5.1)
BUN: 12 mg/dL (ref 7–25)
CALCIUM: 9 mg/dL (ref 8.6–10.4)
CHLORIDE: 105 mmol/L (ref 98–110)
CO2: 20 mmol/L (ref 20–31)
Creat: 0.64 mg/dL (ref 0.60–0.93)
Glucose, Bld: 107 mg/dL — ABNORMAL HIGH (ref 65–99)
POTASSIUM: 4.1 mmol/L (ref 3.5–5.3)
Sodium: 140 mmol/L (ref 135–146)
TOTAL PROTEIN: 6.9 g/dL (ref 6.1–8.1)
Total Bilirubin: 0.9 mg/dL (ref 0.2–1.2)

## 2016-04-16 LAB — LIPID PANEL
Cholesterol: 255 mg/dL — ABNORMAL HIGH (ref 125–200)
HDL: 35 mg/dL — ABNORMAL LOW (ref >=46)
LDL Cholesterol: 184 mg/dL — ABNORMAL HIGH (ref <130)
TRIGLYCERIDES: 179 mg/dL — AB (ref <150)
Total CHOL/HDL Ratio: 7.3 Ratio — ABNORMAL HIGH (ref <=5.0)
VLDL: 36 mg/dL — ABNORMAL HIGH (ref <30)

## 2016-04-18 ENCOUNTER — Telehealth: Payer: Self-pay

## 2016-04-18 NOTE — Telephone Encounter (Signed)
-----   Message from Antoine PocheJonathan F Branch, MD sent at 04/16/2016 12:19 PM EDT ----- Labs show cholesterol is elevated. Her liver blood tests remain mildly elevated, no better or worst from last check. We will discuss at our upcoming f/u if we will restart a cholesterol medication or not  Dominga FerryJ Branch MD

## 2016-04-18 NOTE — Telephone Encounter (Signed)
Tried to reach pt. Number on file has a fast busy signal. I mailed pt a letter and a copy of labs as well as a reminder of the 5/2 appt/ @ 10.

## 2016-04-22 ENCOUNTER — Ambulatory Visit (INDEPENDENT_AMBULATORY_CARE_PROVIDER_SITE_OTHER): Payer: Medicare Other | Admitting: Cardiology

## 2016-04-22 ENCOUNTER — Encounter: Payer: Self-pay | Admitting: Cardiology

## 2016-04-22 VITALS — BP 154/74 | HR 72 | Ht 64.5 in | Wt 171.0 lb

## 2016-04-22 DIAGNOSIS — I10 Essential (primary) hypertension: Secondary | ICD-10-CM

## 2016-04-22 DIAGNOSIS — R002 Palpitations: Secondary | ICD-10-CM

## 2016-04-22 DIAGNOSIS — E785 Hyperlipidemia, unspecified: Secondary | ICD-10-CM

## 2016-04-22 DIAGNOSIS — G459 Transient cerebral ischemic attack, unspecified: Secondary | ICD-10-CM

## 2016-04-22 MED ORDER — PRAVASTATIN SODIUM 20 MG PO TABS: 20.0000 mg | ORAL_TABLET | Freq: Every evening | ORAL | Status: DC

## 2016-04-22 NOTE — Patient Instructions (Signed)
Medication Instructions:  START PRAVASTATIN 20 MG DAILY   Labwork: NONE  Testing/Procedures: NONE  Follow-Up: Your physician wants you to follow-up in: 6 MONTHS WITH DR. BRANCH.  You will receive a reminder letter in the mail two months in advance. If you don't receive a letter, please call our office to schedule the follow-up appointment.   Any Other Special Instructions Will Be Listed Below (If Applicable).  PLEASE KEEP A BLOOD PRESSURE LOG FOR 1 WEEK AND EITHER CALL US WITH THE RESULTS OR DROP IT OFF BY THE OFFICE FOR DR. BRANCH TO REVIEW.    If you need a refill on your cardiac medications before your next appointment, please call your pharmacy. \

## 2016-04-22 NOTE — Progress Notes (Signed)
Patient ID: Nicole Cobb, female   DOB: Jul 22, 1941, 75 y.o.   MRN: [id number]     Clinical Summary Nicole Cobb is a 75 y.o.female seen today as a new patient for the following medical problems.   1. TIA - 01/05/2015 episode of blurry vision, slurred speech. - 01/2015 echo LVEF 60-65%, grade I diastolic dysfunction - 02/2015 event monitor without significant arrhythmias - 02/2015 carotid US bilateral 1-39% stenosis  - no recurrent symptoms since last visit. She is on plavix for secondary prevention  2. Palpitations - normal monitor 02/2015 - denies any recent palpitations since our last visit.   3. Hyperlipidemia - changed to prava due to elevated LFTs. AST/ALT into 40s at that time,  - 02/2015 changed from lipitor to pravastatin. After change conitnued mild increase into the 60s in LFTs - abd [place] and MRI show fatty liver.  - last visit we stopped pravastatin due to mild elevated LFTs and leg pains. Off x 2 months, no change in LFTs, still with mild elevation   Past Medical History  Diagnosis Date  . Hiatal hernia   . Anxiety   . Hypokalemia   . BPV (benign positional vertigo)   . Anemia   . Hyperlipidemia   . HTN (hypertension)   . Vertigo      No Known Allergies   Current Outpatient Prescriptions  Medication Sig Dispense Refill  . ALPRAZolam (XANAX) 0.5 MG tablet Take 1 tablet (0.5 mg total) by mouth 2 (two) times daily as needed for sleep or anxiety. (Patient taking differently: Take 0.5 mg by mouth 2 (two) times daily. ) 30 tablet 0  . atenolol (TENORMIN) 50 MG tablet Take 1 tablet (50 mg total) by mouth daily. 30 tablet 3  . cetirizine (ZYRTEC) 10 MG tablet Take 10 mg by mouth daily as needed for allergies.     . Cholecalciferol (D3-1000 PO) Take 2,000 mg by mouth daily.     . clopidogrel (PLAVIX) 75 MG tablet Take 75 mg by mouth daily.    [name] co-enzyme Q-10 50 MG capsule Take 200 mg by mouth at bedtime.     . fluticasone (FLONASE) 50 MCG/ACT nasal spray Place 1  spray into both nostrils daily.    [name] KRILL OIL PO Take 600 mg by mouth daily.    [name] loratadine (CLARITIN) 10 MG tablet Take 10 mg by mouth daily.    [name] losartan (COZAAR) 25 MG tablet Take 25 mg by mouth daily.    . Lutein 20 MG CAPS Take 20 mg by mouth daily.    [name] omeprazole (PRILOSEC) 20 MG capsule Take 20 mg by mouth daily.     No current facility-administered medications for this visit.     Past Surgical History  Procedure Laterality Date  . Tubal ligation       No Known Allergies    Family History  Problem Relation Age of Onset  . Diabetes Mother   . Cancer Mother     breast   . Cancer Father     lymphoma  . Heart failure Mother   . Heart failure Brother   . Heart attack Brother   . Cancer Brother     stomach     Social History Nicole Cobb reports that she has never smoked. She has never used smokeless tobacco. Nicole Cobb reports that she does not drink alcohol.   Review of Systems CONSTITUTIONAL: No weight loss, fever, chills, weakness or fatigue.  HEENT: Eyes: No visual loss, blurred vision,  double vision or yellow sclerae.No hearing loss, sneezing, congestion, runny nose or sore throat.  SKIN: No rash or itching.  CARDIOVASCULAR: per HPI RESPIRATORY: No shortness of breath, cough or sputum.  GASTROINTESTINAL: No anorexia, nausea, vomiting or diarrhea. No abdominal pain or blood.  GENITOURINARY: No burning on urination, no polyuria NEUROLOGICAL: No headache, dizziness, syncope, paralysis, ataxia, numbness or tingling in the extremities. No change in bowel or bladder control.  MUSCULOSKELETAL: No muscle, back pain, joint pain or stiffness.  LYMPHATICS: No enlarged nodes. No history of splenectomy.  PSYCHIATRIC: No history of depression or anxiety.  ENDOCRINOLOGIC: No reports of sweating, cold or heat intolerance. No polyuria or polydipsia.  [name].   Physical Examination Filed Vitals:   04/22/16 0957  BP: 154/74  Pulse: 72   Filed Vitals:   04/22/16  0957  Height: 5' 4.5" (1.638 m)  Weight: 171 lb (77.565 kg)    Gen: resting comfortably, no acute distress HEENT: no scleral icterus, pupils equal round and reactive, no palptable cervical adenopathy,  CV: RRR, no m/r/g, no jvd Resp: Clear to auscultation bilaterally GI: abdomen is soft, non-tender, non-distended, normal bowel sounds, no hepatosplenomegaly MSK: extremities are warm, no edema.  Skin: warm, no rash Neuro:  no focal deficits Psych: appropriate affect    Assessment and Plan  1. TIA - no evidence of cardiac etiology - no further cardiac testing at this time - continue secondary prevention  2. Palpitations - normal monitor previously - no recent symptoms, continue to monitor  3. Hyperlipideima - mildly elevated AST/ALT that persists after changing lipitor to pravastaitn and after stopping pravastatin x 2 months. Appears unrelated to statin, likely due to fatty liver as seen on imaging. - restart pravastatin 20mg  daily. Repeat liver tests 6 months  4. HTN - elevated in clinic. She will submit bp log in 1 week for review.     F/u 6 months  Antoine PocheJonathan F. Stephaniemarie Stoffel, M.D.

## 2016-06-03 DIAGNOSIS — H2513 Age-related nuclear cataract, bilateral: Secondary | ICD-10-CM | POA: Diagnosis not present

## 2016-06-03 DIAGNOSIS — H538 Other visual disturbances: Secondary | ICD-10-CM | POA: Diagnosis not present

## 2016-10-22 ENCOUNTER — Encounter: Payer: Self-pay | Admitting: Cardiology

## 2016-10-22 ENCOUNTER — Ambulatory Visit (INDEPENDENT_AMBULATORY_CARE_PROVIDER_SITE_OTHER): Payer: Medicare Other | Admitting: Cardiology

## 2016-10-22 VITALS — BP 146/86 | HR 83 | Ht 64.0 in | Wt 172.0 lb

## 2016-10-22 DIAGNOSIS — Z79899 Other long term (current) drug therapy: Secondary | ICD-10-CM

## 2016-10-22 DIAGNOSIS — R002 Palpitations: Secondary | ICD-10-CM

## 2016-10-22 DIAGNOSIS — E782 Mixed hyperlipidemia: Secondary | ICD-10-CM

## 2016-10-22 DIAGNOSIS — I10 Essential (primary) hypertension: Secondary | ICD-10-CM | POA: Diagnosis not present

## 2016-10-22 LAB — LIPID PANEL
CHOLESTEROL: 180 mg/dL (ref 125–200)
HDL: 35 mg/dL — ABNORMAL LOW (ref >=46)
LDL Cholesterol: 120 mg/dL (ref <130)
TRIGLYCERIDES: 124 mg/dL (ref <150)
Total CHOL/HDL Ratio: 5.1 Ratio — ABNORMAL HIGH (ref <=5.0)
VLDL: 25 mg/dL (ref <30)

## 2016-10-22 LAB — COMPREHENSIVE METABOLIC PANEL
ALBUMIN: 4.1 g/dL (ref 3.6–5.1)
ALT: 36 U/L — AB (ref 6–29)
AST: 40 U/L — AB (ref 10–35)
Alkaline Phosphatase: 78 U/L (ref 33–130)
BILIRUBIN TOTAL: 0.8 mg/dL (ref 0.2–1.2)
BUN: 10 mg/dL (ref 7–25)
CO2: 26 mmol/L (ref 20–31)
CREATININE: 0.66 mg/dL (ref 0.60–0.93)
Calcium: 9.1 mg/dL (ref 8.6–10.4)
Chloride: 104 mmol/L (ref 98–110)
Glucose, Bld: 104 mg/dL — ABNORMAL HIGH (ref 65–99)
Potassium: 4.3 mmol/L (ref 3.5–5.3)
SODIUM: 138 mmol/L (ref 135–146)
Total Protein: 6.4 g/dL (ref 6.1–8.1)

## 2016-10-22 MED ORDER — PRAVASTATIN SODIUM 20 MG PO TABS: 20.0000 mg | ORAL_TABLET | Freq: Every evening | ORAL | 3 refills | Status: DC

## 2016-10-22 NOTE — Progress Notes (Signed)
Clinical Summary Ms. He is a 75 y.o.female seen today as a new patient for the following medical problems.   1. TIA - 01/05/2015 episode of blurry vision, slurred speech. - 01/2015 echo LVEF 60-65%, grade I diastolic dysfunction - 02/2015 event monitor without significant arrhythmias - 02/2015 carotid US bilateral 1-39% stenosis   - no recent symptoms - compliant with meds  2. Palpitations - normal monitor 02/2015 - denies any recent palpitations    3. Hyperlipidemia - changed to prava due to elevated LFTs. AST/ALT into 40s at that time,  - 02/2015 changed from lipitor to pravastatin. After change conitnued mild increase into the 60s in LFTs - abd US and MRI show fatty liver.  - last visit we stopped pravastatin due to mild elevated LFTs and leg pains. Off x 2 months, no change in LFTs, still with mild elevation  - compliant with statin  4. Fatty liver - followed by pcp   Past Medical History:  Diagnosis Date  . Anemia   . Anxiety   . BPV (benign positional vertigo)   . Hiatal hernia   . HTN (hypertension)   . Hyperlipidemia   . Hypokalemia   . Vertigo      No Known Allergies   Current Outpatient Prescriptions  Medication Sig Dispense Refill  . ALPRAZolam (XANAX) 0.5 MG tablet Take 1 tablet (0.5 mg total) by mouth 2 (two) times daily as needed for sleep or anxiety. (Patient taking differently: Take 0.5 mg by mouth 2 (two) times daily. ) 30 tablet 0  . atenolol (TENORMIN) 50 MG tablet Take 1 tablet (50 mg total) by mouth daily. 30 tablet 3  . cetirizine (ZYRTEC) 10 MG tablet Take 10 mg by mouth daily as needed for allergies.     . Cholecalciferol (D3-1000 PO) Take 2,000 mg by mouth daily.     . Cinnamon 500 MG capsule Take 1,000 mg by mouth daily.    . clopidogrel (PLAVIX) 75 MG tablet Take 75 mg by mouth daily.    [name]. co-enzyme Q-10 50 MG capsule Take 200 mg by mouth at bedtime.     . fluticasone (FLONASE) 50 MCG/ACT nasal spray Place 1 spray into  both nostrils daily.    [name]. KRILL OIL PO Take 600 mg by mouth daily.    [name]. loratadine (CLARITIN) 10 MG tablet Take 10 mg by mouth daily.    [name]. losartan (COZAAR) 25 MG tablet Take 25 mg by mouth daily.    . Lutein 20 MG CAPS Take 20 mg by mouth daily.    [name]. omeprazole (PRILOSEC) 20 MG capsule Take 20 mg by mouth daily.    [name]. OVER THE COUNTER MEDICATION Take 450 mg by mouth daily. Apple Cider Vinegar Cap.    . pravastatin (PRAVACHOL) 20 MG tablet Take 1 tablet (20 mg total) by mouth every evening. 90 tablet 3   No current facility-administered medications for this visit.      Past Surgical History:  Procedure Laterality Date  . TUBAL LIGATION       No Known Allergies    Family History  Problem Relation Age of Onset  . Diabetes Mother   . Cancer Mother     breast   . Cancer Father     lymphoma  . Heart failure Mother   . Heart failure Brother   . Heart attack Brother   . Cancer Brother     stomach     Social History Ms. Borawski reports that  she has never smoked. She has never used smokeless tobacco. Ms. Shella SpearingChrismon reports that she does not drink alcohol.   Review of Systems CONSTITUTIONAL: No weight loss, fever, chills, weakness or fatigue.  HEENT: Eyes: No visual loss, blurred vision, double vision or yellow sclerae.No hearing loss, sneezing, congestion, runny nose or sore throat.  SKIN: No rash or itching.  CARDIOVASCULAR: per hpi RESPIRATORY: No shortness of breath, cough or sputum.  GASTROINTESTINAL: No anorexia, nausea, vomiting or diarrhea. No abdominal pain or blood.  GENITOURINARY: No burning on urination, no polyuria NEUROLOGICAL: No headache, dizziness, syncope, paralysis, ataxia, numbness or tingling in the extremities. No change in bowel or bladder control.  MUSCULOSKELETAL: No muscle, back pain, joint pain or stiffness.  LYMPHATICS: No enlarged nodes. No history of splenectomy.  PSYCHIATRIC: No history of depression or anxiety.  ENDOCRINOLOGIC: No reports of  sweating, cold or heat intolerance. No polyuria or polydipsia.  [name].   Physical Examination Vitals:   10/22/16 0848  BP: (!) 146/86  Pulse: 83   Vitals:   10/22/16 0848  Weight: 172 lb (78 kg)  Height: 5\' 4"  (1.626 m)    Gen: resting comfortably, no acute distress HEENT: no scleral icterus, pupils equal round and reactive, no palptable cervical adenopathy,  CV: RRR, no m/r/g, nojvd Resp: Clear to auscultation bilaterally GI: abdomen is soft, non-tender, non-distended, normal bowel sounds, no hepatosplenomegaly MSK: extremities are warm, no edema.  Skin: warm, no rash Neuro:  no focal deficits Psych: appropriate affect   Diagnostic Studies     Assessment and Plan  1. TIA - no evidence of cardiac etiology - continue secondary prevention  2. Palpitations - normal monitor previously - no recent symptoms. We will continue to monitor  3. Hyperlipideima - mildly elevated AST/ALT that persists after changing lipitor to pravastaitn and after stopping pravastatin x 2 months. Appears unrelated to statin, likely due to fatty liver as seen on imaging.  - continue statin. Repeat lipid panels.   4. HTN - at goal, continue curren tmeds    F/u 6 months      Antoine PocheJonathan F. Charlena Haub, M.D.

## 2016-10-22 NOTE — Patient Instructions (Signed)
Medication Instructions:  Your physician recommends that you continue on your current medications as directed. Please refer to the Current Medication list given to you today.   Labwork: Your physician recommends that you return for lab work in: ASAP CMET FASTING LIPID    Testing/Procedures: NONE  Follow-Up: Your physician wants you to follow-up in: 6 MONTHS.  You will receive a reminder letter in the mail two months in advance. If you don't receive a letter, please call our office to schedule the follow-up appointment.   Any Other Special Instructions Will Be Listed Below (If Applicable).     If you need a refill on your cardiac medications before your next appointment, please call your pharmacy.

## 2016-12-24 DIAGNOSIS — K219 Gastro-esophageal reflux disease without esophagitis: Secondary | ICD-10-CM | POA: Diagnosis not present

## 2016-12-24 DIAGNOSIS — G459 Transient cerebral ischemic attack, unspecified: Secondary | ICD-10-CM | POA: Diagnosis not present

## 2016-12-24 DIAGNOSIS — Z1389 Encounter for screening for other disorder: Secondary | ICD-10-CM | POA: Diagnosis not present

## 2016-12-24 DIAGNOSIS — I10 Essential (primary) hypertension: Secondary | ICD-10-CM | POA: Diagnosis not present

## 2017-05-07 ENCOUNTER — Encounter: Payer: Self-pay | Admitting: Cardiology

## 2017-05-07 ENCOUNTER — Ambulatory Visit (INDEPENDENT_AMBULATORY_CARE_PROVIDER_SITE_OTHER): Payer: Medicare Other | Admitting: Cardiology

## 2017-05-07 ENCOUNTER — Other Ambulatory Visit: Payer: Self-pay | Admitting: Cardiology

## 2017-05-07 VITALS — BP 136/78 | HR 76 | Ht 64.0 in | Wt 168.0 lb

## 2017-05-07 DIAGNOSIS — R7309 Other abnormal glucose: Secondary | ICD-10-CM | POA: Diagnosis not present

## 2017-05-07 DIAGNOSIS — I10 Essential (primary) hypertension: Secondary | ICD-10-CM

## 2017-05-07 DIAGNOSIS — R002 Palpitations: Secondary | ICD-10-CM | POA: Diagnosis not present

## 2017-05-07 DIAGNOSIS — E785 Hyperlipidemia, unspecified: Secondary | ICD-10-CM | POA: Diagnosis not present

## 2017-05-07 DIAGNOSIS — Z8673 Personal history of transient ischemic attack (TIA), and cerebral infarction without residual deficits: Secondary | ICD-10-CM | POA: Diagnosis not present

## 2017-05-07 LAB — LIPID PANEL
CHOL/HDL RATIO: 4.3 ratio (ref <5.0)
Cholesterol: 158 mg/dL (ref <200)
HDL: 37 mg/dL — ABNORMAL LOW (ref >50)
LDL CALC: 94 mg/dL (ref <100)
Triglycerides: 135 mg/dL (ref <150)
VLDL: 27 mg/dL (ref <30)

## 2017-05-07 LAB — CBC WITH DIFFERENTIAL/PLATELET
BASOS ABS: 0 {cells}/uL (ref 0–200)
Basophils Relative: 0 %
EOS ABS: 136 {cells}/uL (ref 15–500)
Eosinophils Relative: 2 %
HEMATOCRIT: 40.1 % (ref 35.0–45.0)
HEMOGLOBIN: 13.6 g/dL (ref 11.7–15.5)
LYMPHS ABS: 2108 {cells}/uL (ref 850–3900)
Lymphocytes Relative: 31 %
MCH: 30.7 pg (ref 27.0–33.0)
MCHC: 33.9 g/dL (ref 32.0–36.0)
MCV: 90.5 fL (ref 80.0–100.0)
MONO ABS: 544 {cells}/uL (ref 200–950)
MPV: 9.6 fL (ref 7.5–12.5)
Monocytes Relative: 8 %
Neutro Abs: 4012 cells/uL (ref 1500–7800)
Neutrophils Relative %: 59 %
Platelets: 227 10*3/uL (ref 140–400)
RBC: 4.43 MIL/uL (ref 3.80–5.10)
RDW: 13.5 % (ref 11.0–15.0)
WBC: 6.8 10*3/uL (ref 3.8–10.8)

## 2017-05-07 LAB — COMPREHENSIVE METABOLIC PANEL
ALT: 23 U/L (ref 6–29)
AST: 27 U/L (ref 10–35)
Albumin: 4.3 g/dL (ref 3.6–5.1)
Alkaline Phosphatase: 83 U/L (ref 33–130)
BUN: 12 mg/dL (ref 7–25)
CALCIUM: 9.4 mg/dL (ref 8.6–10.4)
CO2: 27 mmol/L (ref 20–31)
Chloride: 105 mmol/L (ref 98–110)
Creat: 0.75 mg/dL (ref 0.60–0.93)
Glucose, Bld: 107 mg/dL — ABNORMAL HIGH (ref 65–99)
POTASSIUM: 5.3 mmol/L (ref 3.5–5.3)
Sodium: 142 mmol/L (ref 135–146)
Total Bilirubin: 1 mg/dL (ref 0.2–1.2)
Total Protein: 6.7 g/dL (ref 6.1–8.1)

## 2017-05-07 LAB — TSH: TSH: 2.61 mIU/L

## 2017-05-07 LAB — MAGNESIUM: MAGNESIUM: 1.9 mg/dL (ref 1.5–2.5)

## 2017-05-07 MED ORDER — PRAVASTATIN SODIUM 20 MG PO TABS: 20.0000 mg | ORAL_TABLET | Freq: Every evening | ORAL | 3 refills | Status: DC
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
## 2017-05-07 NOTE — Patient Instructions (Signed)
Medication Instructions:  Your physician recommends that you continue on your current medications as directed. Please refer to the Current Medication list given to you today.   Labwork: Your physician recommends that you return for lab work in: ASAP    Testing/Procedures: NONE  Follow-Up: Your physician wants you to follow-up in: 1 YEAR .  You will receive a reminder letter in the mail two months in advance. If you don't receive a letter, please call our office to schedule the follow-up appointment.   Any Other Special Instructions Will Be Listed Below (If Applicable).     If you need a refill on your cardiac medications before your next appointment, please call your pharmacy.   

## 2017-05-08 LAB — HEMOGLOBIN A1C
HEMOGLOBIN A1C: 6.2 % — AB (ref <5.7)
MEAN PLASMA GLUCOSE: 131 mg/dL

## 2017-07-22 DIAGNOSIS — Z1211 Encounter for screening for malignant neoplasm of colon: Secondary | ICD-10-CM | POA: Diagnosis not present

## 2018-01-07 DIAGNOSIS — E748 Other specified disorders of carbohydrate metabolism: Secondary | ICD-10-CM | POA: Diagnosis not present

## 2018-01-07 DIAGNOSIS — I10 Essential (primary) hypertension: Secondary | ICD-10-CM | POA: Diagnosis not present

## 2018-01-07 DIAGNOSIS — Z0000 Encounter for general adult medical examination without abnormal findings: Secondary | ICD-10-CM | POA: Diagnosis not present

## 2018-01-07 DIAGNOSIS — Z79899 Other long term (current) drug therapy: Secondary | ICD-10-CM | POA: Diagnosis not present

## 2018-01-07 DIAGNOSIS — E1129 Type 2 diabetes mellitus with other diabetic kidney complication: Secondary | ICD-10-CM | POA: Diagnosis not present

## 2018-01-07 DIAGNOSIS — Z1389 Encounter for screening for other disorder: Secondary | ICD-10-CM | POA: Diagnosis not present

## 2018-01-07 DIAGNOSIS — Z8673 Personal history of transient ischemic attack (TIA), and cerebral infarction without residual deficits: Secondary | ICD-10-CM | POA: Diagnosis not present

## 2018-01-07 DIAGNOSIS — N281 Cyst of kidney, acquired: Secondary | ICD-10-CM | POA: Diagnosis not present

## 2018-01-08 ENCOUNTER — Other Ambulatory Visit (HOSPITAL_COMMUNITY): Payer: Self-pay | Admitting: Internal Medicine

## 2018-01-08 DIAGNOSIS — E2839 Other primary ovarian failure: Secondary | ICD-10-CM

## 2018-01-11 DIAGNOSIS — R7309 Other abnormal glucose: Secondary | ICD-10-CM | POA: Diagnosis not present

## 2018-01-25 ENCOUNTER — Ambulatory Visit (HOSPITAL_COMMUNITY)
Admission: RE | Admit: 2018-01-25 | Discharge: 2018-01-25 | Disposition: A | Discharge status: Home / Self Care | Payer: Medicare Other | Source: Ambulatory Visit | Attending: Internal Medicine | Admitting: Internal Medicine

## 2018-01-25 DIAGNOSIS — Z780 Asymptomatic menopausal state: Secondary | ICD-10-CM | POA: Diagnosis not present

## 2018-01-25 DIAGNOSIS — M810 Age-related osteoporosis without current pathological fracture: Secondary | ICD-10-CM | POA: Diagnosis not present

## 2018-01-25 DIAGNOSIS — E2839 Other primary ovarian failure: Secondary | ICD-10-CM | POA: Diagnosis not present

## 2018-02-08 DIAGNOSIS — J3489 Other specified disorders of nose and nasal sinuses: Secondary | ICD-10-CM | POA: Diagnosis not present

## 2018-02-08 DIAGNOSIS — J019 Acute sinusitis, unspecified: Secondary | ICD-10-CM | POA: Diagnosis not present

## 2019-02-15 DIAGNOSIS — Z79899 Other long term (current) drug therapy: Secondary | ICD-10-CM | POA: Diagnosis not present

## 2019-02-15 DIAGNOSIS — Z0001 Encounter for general adult medical examination with abnormal findings: Secondary | ICD-10-CM | POA: Diagnosis not present

## 2019-02-15 DIAGNOSIS — Z23 Encounter for immunization: Secondary | ICD-10-CM | POA: Diagnosis not present

## 2019-02-15 DIAGNOSIS — Z1389 Encounter for screening for other disorder: Secondary | ICD-10-CM | POA: Diagnosis not present

## 2019-02-15 DIAGNOSIS — K219 Gastro-esophageal reflux disease without esophagitis: Secondary | ICD-10-CM | POA: Diagnosis not present

## 2019-02-15 DIAGNOSIS — Z8673 Personal history of transient ischemic attack (TIA), and cerebral infarction without residual deficits: Secondary | ICD-10-CM | POA: Diagnosis not present

## 2019-02-15 DIAGNOSIS — M810 Age-related osteoporosis without current pathological fracture: Secondary | ICD-10-CM | POA: Diagnosis not present

## 2019-02-15 DIAGNOSIS — I10 Essential (primary) hypertension: Secondary | ICD-10-CM | POA: Diagnosis not present

## 2019-02-15 DIAGNOSIS — Z0000 Encounter for general adult medical examination without abnormal findings: Secondary | ICD-10-CM | POA: Diagnosis not present

## 2019-02-15 DIAGNOSIS — N281 Cyst of kidney, acquired: Secondary | ICD-10-CM | POA: Diagnosis not present

## 2020-06-12 DIAGNOSIS — E538 Deficiency of other specified B group vitamins: Secondary | ICD-10-CM | POA: Diagnosis not present

## 2020-06-12 DIAGNOSIS — K219 Gastro-esophageal reflux disease without esophagitis: Secondary | ICD-10-CM | POA: Diagnosis not present

## 2020-06-12 DIAGNOSIS — E039 Hypothyroidism, unspecified: Secondary | ICD-10-CM | POA: Diagnosis not present

## 2020-06-12 DIAGNOSIS — Z1389 Encounter for screening for other disorder: Secondary | ICD-10-CM | POA: Diagnosis not present

## 2020-06-12 DIAGNOSIS — Z0001 Encounter for general adult medical examination with abnormal findings: Secondary | ICD-10-CM | POA: Diagnosis not present

## 2020-06-12 DIAGNOSIS — Z0000 Encounter for general adult medical examination without abnormal findings: Secondary | ICD-10-CM | POA: Diagnosis not present

## 2020-06-12 DIAGNOSIS — E559 Vitamin D deficiency, unspecified: Secondary | ICD-10-CM | POA: Diagnosis not present

## 2020-06-12 DIAGNOSIS — I10 Essential (primary) hypertension: Secondary | ICD-10-CM | POA: Diagnosis not present

## 2020-06-12 DIAGNOSIS — M810 Age-related osteoporosis without current pathological fracture: Secondary | ICD-10-CM | POA: Diagnosis not present

## 2020-09-21 DIAGNOSIS — E785 Hyperlipidemia, unspecified: Secondary | ICD-10-CM | POA: Diagnosis not present

## 2021-07-10 DIAGNOSIS — K219 Gastro-esophageal reflux disease without esophagitis: Secondary | ICD-10-CM | POA: Diagnosis not present

## 2021-07-10 DIAGNOSIS — E038 Other specified hypothyroidism: Secondary | ICD-10-CM | POA: Diagnosis not present

## 2021-07-10 DIAGNOSIS — E782 Mixed hyperlipidemia: Secondary | ICD-10-CM | POA: Diagnosis not present

## 2021-07-10 DIAGNOSIS — Z8673 Personal history of transient ischemic attack (TIA), and cerebral infarction without residual deficits: Secondary | ICD-10-CM | POA: Diagnosis not present

## 2021-07-10 DIAGNOSIS — I10 Essential (primary) hypertension: Secondary | ICD-10-CM | POA: Diagnosis not present

## 2021-07-10 DIAGNOSIS — Z0001 Encounter for general adult medical examination with abnormal findings: Secondary | ICD-10-CM | POA: Diagnosis not present

## 2021-07-10 DIAGNOSIS — Z1389 Encounter for screening for other disorder: Secondary | ICD-10-CM | POA: Diagnosis not present

## 2022-01-19 ENCOUNTER — Other Ambulatory Visit: Payer: Self-pay

## 2022-01-19 ENCOUNTER — Emergency Department (HOSPITAL_COMMUNITY): Payer: Medicare Other

## 2022-01-19 ENCOUNTER — Encounter (HOSPITAL_COMMUNITY): Payer: Self-pay

## 2022-01-19 ENCOUNTER — Inpatient Hospital Stay (HOSPITAL_COMMUNITY)
Admission: EM | Admit: 2022-01-19 | Discharge: 2022-01-21 | DRG: 064 | Disposition: A | Discharge status: Home Health | Payer: Medicare Other | Attending: Internal Medicine | Admitting: Internal Medicine

## 2022-01-19 DIAGNOSIS — K449 Diaphragmatic hernia without obstruction or gangrene: Secondary | ICD-10-CM | POA: Diagnosis not present

## 2022-01-19 DIAGNOSIS — R270 Ataxia, unspecified: Secondary | ICD-10-CM | POA: Diagnosis not present

## 2022-01-19 DIAGNOSIS — E871 Hypo-osmolality and hyponatremia: Secondary | ICD-10-CM

## 2022-01-19 DIAGNOSIS — Z833 Family history of diabetes mellitus: Secondary | ICD-10-CM | POA: Diagnosis not present

## 2022-01-19 DIAGNOSIS — I10 Essential (primary) hypertension: Secondary | ICD-10-CM | POA: Diagnosis present

## 2022-01-19 DIAGNOSIS — R410 Disorientation, unspecified: Secondary | ICD-10-CM | POA: Diagnosis not present

## 2022-01-19 DIAGNOSIS — E781 Pure hyperglyceridemia: Secondary | ICD-10-CM | POA: Diagnosis not present

## 2022-01-19 DIAGNOSIS — R509 Fever, unspecified: Secondary | ICD-10-CM | POA: Diagnosis present

## 2022-01-19 DIAGNOSIS — Z7902 Long term (current) use of antithrombotics/antiplatelets: Secondary | ICD-10-CM | POA: Diagnosis not present

## 2022-01-19 DIAGNOSIS — I6389 Other cerebral infarction: Secondary | ICD-10-CM | POA: Diagnosis not present

## 2022-01-19 DIAGNOSIS — Z20822 Contact with and (suspected) exposure to covid-19: Secondary | ICD-10-CM | POA: Diagnosis present

## 2022-01-19 DIAGNOSIS — Z7983 Long term (current) use of bisphosphonates: Secondary | ICD-10-CM | POA: Diagnosis not present

## 2022-01-19 DIAGNOSIS — D72829 Elevated white blood cell count, unspecified: Secondary | ICD-10-CM | POA: Diagnosis present

## 2022-01-19 DIAGNOSIS — Z8249 Family history of ischemic heart disease and other diseases of the circulatory system: Secondary | ICD-10-CM | POA: Diagnosis not present

## 2022-01-19 DIAGNOSIS — Z8673 Personal history of transient ischemic attack (TIA), and cerebral infarction without residual deficits: Secondary | ICD-10-CM

## 2022-01-19 DIAGNOSIS — I639 Cerebral infarction, unspecified: Secondary | ICD-10-CM | POA: Diagnosis not present

## 2022-01-19 DIAGNOSIS — G4489 Other headache syndrome: Secondary | ICD-10-CM | POA: Diagnosis not present

## 2022-01-19 DIAGNOSIS — Z807 Family history of other malignant neoplasms of lymphoid, hematopoietic and related tissues: Secondary | ICD-10-CM

## 2022-01-19 DIAGNOSIS — G9341 Metabolic encephalopathy: Secondary | ICD-10-CM | POA: Diagnosis not present

## 2022-01-19 DIAGNOSIS — Z79899 Other long term (current) drug therapy: Secondary | ICD-10-CM | POA: Diagnosis not present

## 2022-01-19 DIAGNOSIS — I6350 Cerebral infarction due to unspecified occlusion or stenosis of unspecified cerebral artery: Principal | ICD-10-CM | POA: Diagnosis present

## 2022-01-19 DIAGNOSIS — I6782 Cerebral ischemia: Secondary | ICD-10-CM | POA: Diagnosis not present

## 2022-01-19 DIAGNOSIS — R29704 NIHSS score 4: Secondary | ICD-10-CM | POA: Diagnosis not present

## 2022-01-19 DIAGNOSIS — K219 Gastro-esophageal reflux disease without esophagitis: Secondary | ICD-10-CM | POA: Diagnosis not present

## 2022-01-19 DIAGNOSIS — R9431 Abnormal electrocardiogram [ECG] [EKG]: Secondary | ICD-10-CM | POA: Diagnosis not present

## 2022-01-19 DIAGNOSIS — I63411 Cerebral infarction due to embolism of right middle cerebral artery: Secondary | ICD-10-CM | POA: Diagnosis not present

## 2022-01-19 DIAGNOSIS — I6523 Occlusion and stenosis of bilateral carotid arteries: Secondary | ICD-10-CM | POA: Diagnosis not present

## 2022-01-19 DIAGNOSIS — R519 Headache, unspecified: Secondary | ICD-10-CM | POA: Diagnosis not present

## 2022-01-19 DIAGNOSIS — I2510 Atherosclerotic heart disease of native coronary artery without angina pectoris: Secondary | ICD-10-CM | POA: Diagnosis not present

## 2022-01-19 DIAGNOSIS — I6502 Occlusion and stenosis of left vertebral artery: Secondary | ICD-10-CM | POA: Diagnosis not present

## 2022-01-19 DIAGNOSIS — H53462 Homonymous bilateral field defects, left side: Secondary | ICD-10-CM | POA: Diagnosis present

## 2022-01-19 DIAGNOSIS — F411 Generalized anxiety disorder: Secondary | ICD-10-CM | POA: Diagnosis present

## 2022-01-19 DIAGNOSIS — R739 Hyperglycemia, unspecified: Secondary | ICD-10-CM | POA: Diagnosis not present

## 2022-01-19 DIAGNOSIS — G43109 Migraine with aura, not intractable, without status migrainosus: Secondary | ICD-10-CM | POA: Diagnosis present

## 2022-01-19 DIAGNOSIS — E222 Syndrome of inappropriate secretion of antidiuretic hormone: Secondary | ICD-10-CM | POA: Diagnosis present

## 2022-01-19 DIAGNOSIS — R531 Weakness: Secondary | ICD-10-CM | POA: Diagnosis not present

## 2022-01-19 DIAGNOSIS — E785 Hyperlipidemia, unspecified: Secondary | ICD-10-CM | POA: Diagnosis present

## 2022-01-19 DIAGNOSIS — I63511 Cerebral infarction due to unspecified occlusion or stenosis of right middle cerebral artery: Secondary | ICD-10-CM | POA: Diagnosis not present

## 2022-01-19 LAB — CBC
HCT: 37.5 % (ref 36.0–46.0)
Hemoglobin: 12.8 g/dL (ref 12.0–15.0)
MCH: 30.4 pg (ref 26.0–34.0)
MCHC: 34.1 g/dL (ref 30.0–36.0)
MCV: 89.1 fL (ref 80.0–100.0)
Platelets: 223 10*3/uL (ref 150–400)
RBC: 4.21 MIL/uL (ref 3.87–5.11)
RDW: 12.3 % (ref 11.5–15.5)
WBC: 10.6 10*3/uL — ABNORMAL HIGH (ref 4.0–10.5)
nRBC: 0 % (ref 0.0–0.2)

## 2022-01-19 LAB — URINALYSIS, ROUTINE W REFLEX MICROSCOPIC
Bilirubin Urine: NEGATIVE
Glucose, UA: NEGATIVE mg/dL
Hgb urine dipstick: NEGATIVE
Ketones, ur: 15 mg/dL — AB
Leukocytes,Ua: NEGATIVE
Nitrite: NEGATIVE
Protein, ur: NEGATIVE mg/dL
Specific Gravity, Urine: 1.01 (ref 1.005–1.030)
pH: 6.5 (ref 5.0–8.0)

## 2022-01-19 LAB — I-STAT CHEM 8, ED
BUN: 16 mg/dL (ref 8–23)
Calcium, Ion: 1.1 mmol/L — ABNORMAL LOW (ref 1.15–1.40)
Chloride: 96 mmol/L — ABNORMAL LOW (ref 98–111)
Creatinine, Ser: 0.7 mg/dL (ref 0.44–1.00)
Glucose, Bld: 126 mg/dL — ABNORMAL HIGH (ref 70–99)
HCT: 39 % (ref 36.0–46.0)
Hemoglobin: 13.3 g/dL (ref 12.0–15.0)
Potassium: 3.8 mmol/L (ref 3.5–5.1)
Sodium: 133 mmol/L — ABNORMAL LOW (ref 135–145)
TCO2: 26 mmol/L (ref 22–32)

## 2022-01-19 LAB — ETHANOL: Alcohol, Ethyl (B): <10 mg/dL (ref <10)

## 2022-01-19 LAB — RAPID URINE DRUG SCREEN, HOSP PERFORMED
Amphetamines: NOT DETECTED
Barbiturates: NOT DETECTED
Benzodiazepines: POSITIVE — AB
Cocaine: NOT DETECTED
Opiates: NOT DETECTED
Tetrahydrocannabinol: NOT DETECTED

## 2022-01-19 LAB — COMPREHENSIVE METABOLIC PANEL
ALT: 16 U/L (ref 0–44)
AST: 18 U/L (ref 15–41)
Albumin: 4.4 g/dL (ref 3.5–5.0)
Alkaline Phosphatase: 53 U/L (ref 38–126)
Anion gap: 7 (ref 5–15)
BUN: 17 mg/dL (ref 8–23)
CO2: 26 mmol/L (ref 22–32)
Calcium: 9.1 mg/dL (ref 8.9–10.3)
Chloride: 97 mmol/L — ABNORMAL LOW (ref 98–111)
Creatinine, Ser: 0.68 mg/dL (ref 0.44–1.00)
GFR, Estimated: >60 mL/min (ref >60)
Glucose, Bld: 129 mg/dL — ABNORMAL HIGH (ref 70–99)
Potassium: 3.6 mmol/L (ref 3.5–5.1)
Sodium: 130 mmol/L — ABNORMAL LOW (ref 135–145)
Total Bilirubin: 0.8 mg/dL (ref 0.3–1.2)
Total Protein: 7.3 g/dL (ref 6.5–8.1)

## 2022-01-19 LAB — DIFFERENTIAL
Abs Immature Granulocytes: 0.06 10*3/uL (ref 0.00–0.07)
Basophils Absolute: 0.1 10*3/uL (ref 0.0–0.1)
Basophils Relative: 1 %
Eosinophils Absolute: 0.1 10*3/uL (ref 0.0–0.5)
Eosinophils Relative: 1 %
Immature Granulocytes: 1 %
Lymphocytes Relative: 19 %
Lymphs Abs: 2 10*3/uL (ref 0.7–4.0)
Monocytes Absolute: 0.8 10*3/uL (ref 0.1–1.0)
Monocytes Relative: 8 %
Neutro Abs: 7.6 10*3/uL (ref 1.7–7.7)
Neutrophils Relative %: 70 %

## 2022-01-19 LAB — PROTIME-INR
INR: 1 (ref 0.8–1.2)
Prothrombin Time: 13.5 seconds (ref 11.4–15.2)

## 2022-01-19 LAB — RESP PANEL BY RT-PCR (FLU A&B, COVID) ARPGX2
Influenza A by PCR: NEGATIVE
Influenza B by PCR: NEGATIVE
SARS Coronavirus 2 by RT PCR: NEGATIVE

## 2022-01-19 LAB — APTT: aPTT: 25 seconds (ref 24–36)

## 2022-01-19 MED ORDER — ONDANSETRON HCL 4 MG/2ML IJ SOLN
4.0000 mg | Freq: Once | INTRAMUSCULAR | Status: AC
  Administered 2022-01-19: 4 mg via INTRAVENOUS
  Filled 2022-01-19: qty 2

## 2022-01-19 MED ORDER — ALUM & MAG HYDROXIDE-SIMETH 200-200-20 MG/5ML PO SUSP
30.0000 mL | Freq: Once | ORAL | Status: AC
  Administered 2022-01-19: 30 mL via ORAL
  Filled 2022-01-19: qty 30

## 2022-01-19 MED ORDER — METOCLOPRAMIDE HCL 5 MG/ML IJ SOLN
10.0000 mg | Freq: Once | INTRAMUSCULAR | Status: AC
Start: 2022-01-19 — End: 2022-01-19
  Administered 2022-01-19: 10 mg via INTRAVENOUS
  Filled 2022-01-19: qty 2

## 2022-01-19 MED ORDER — ACETAMINOPHEN 325 MG PO TABS
650.0000 mg | ORAL_TABLET | Freq: Once | ORAL | Status: AC
  Administered 2022-01-19: 650 mg via ORAL
  Filled 2022-01-19: qty 2

## 2022-01-19 MED ORDER — VALPROATE SODIUM 500 MG/5ML IV SOLN
INTRAVENOUS | Status: AC
  Filled 2022-01-19: qty 5

## 2022-01-19 MED ORDER — MECLIZINE HCL 12.5 MG PO TABS
25.0000 mg | ORAL_TABLET | Freq: Once | ORAL | Status: AC
  Administered 2022-01-19: 25 mg via ORAL
  Filled 2022-01-19: qty 2

## 2022-01-19 MED ORDER — VALPROATE SODIUM 100 MG/ML IV SOLN
500.0000 mg | Freq: Once | INTRAVENOUS | Status: AC
  Administered 2022-01-19: 500 mg via INTRAVENOUS
  Filled 2022-01-19: qty 5

## 2022-01-19 MED ORDER — IOHEXOL 350 MG/ML SOLN
100.0000 mL | Freq: Once | INTRAVENOUS | Status: AC | PRN
  Administered 2022-01-19: 100 mL via INTRAVENOUS

## 2022-01-19 MED ORDER — FENTANYL CITRATE PF 50 MCG/ML IJ SOSY
50.0000 ug | PREFILLED_SYRINGE | Freq: Once | INTRAMUSCULAR | Status: AC
  Administered 2022-01-19: 50 ug via INTRAVENOUS
  Filled 2022-01-19: qty 1

## 2022-01-19 NOTE — Consult Note (Signed)
[facility] Tele[facility]urology Consult Services  Stat Consult  Patient Name:   Nicole Cobb, Nicole Cobb Date of Birth:   1941/02/04 Identification Number:   MRN - [id number] Date of Service:   01/19/2022 20:04:58  Diagnosis:       G44.201 - Headache (if no chronic)       G93.49 - Encephalopathy Multifactorial  Impression 81 year old woman with headache in the back of her head, some confusion and difficulty following commands. Her headache started last night, recommending admit for stroke workup versus infectious/metabolic causes. HA itself or infectious/metabolic causes could be contributing to some confusion. HA tx: ketorolac 30mg  IM, reglan 10mg  IV, Depacon 500mg  IV. MR brain/MRA , tele, stroke workup. Neurology following along.  CT HEAD: Reviewed  Advanced Imaging: CTA Head and Neck Completed.  LVO:No  Patient doesn't meet criteria for emergent NIR consideration   Our recommendations are outlined below.  Diagnostic Studies: Recommend MRI brain without contrast Routine MRA head without contrast and MRA Neck with contrast Transthoracic Echo with bubble study, if available  Laboratory Studies: Recommend Lipid panel Hemoglobin A1c  Medications: Initiate dual antiplatelet therapy with Aspirin 81 mg daily and Clopidogrel 75 mg daily Statins for LDL goal less than 70 Hypertonic saline Permissive hypertension, Antihypertensives with prn for first 24-48 hrs post stroke onset. If BP greater than 220/120 give Labetalol IV or Vasotec IV  Nursing Recommendations: Telemetry, IV Fluids, avoid dextrose containing fluids, Maintain euglycemia Neuro checks q4 hrs x 24 hrs and then per shift Head of bed 30 degrees  Consultations: Recommend Speech therapy if failed dysphagia screen Physical therapy/Occupational therapy  DVT Prophylaxis: Choice of Primary Team  Disposition: Neurology will follow   Imaging CT head, no abnormality  Labs NA 130 WBC  10.6  Metrics: [facility] Notification Time: 01/19/2022 20:03:33 Stamp Time: 01/19/2022 20:04:58 Callback Response Time: 01/19/2022 20:07:37   ----------------------------------------------------------------------------------------------------  Chief Complaint: HA  History of Present Illness: Patient is a 80 year old Female. 81 year old woman,  Comes in with HA since 4am and visual field deficits on left and ataxia. She complains of a headache since yesterday with some light sensitivity on the L as well as nausea. HCT and CTA done- nothing acute   Past Medical History:      Hypertension      Hyperlipidemia      Coronary Artery Disease  Medications:  No Anticoagulant use  Antiplatelet use: Yes plavix Reviewed EMR for current medications  Allergies:  Reviewed  Social History: Smoking: No Alcohol Use: No Drug Use: No  Family History:  There Is Family History Of: CAD + There is no family history of premature cerebrovascular disease pertinent to this consultation  ROS : 14 Points Review of Systems was performed and was negative except mentioned in HPI.  Past Surgical History: There Is No Surgical History Contributory To Todays Visit   Examination: BP(155/73), Pulse(92), Blood Glucose(126) 1A: Level of Consciousness - Alert; keenly responsive + 0 1B: Ask Month and Age - Both Questions Right + 0 1C: Blink Eyes & Squeeze Hands - Performs Both Tasks + 0 2: Test Horizontal Extraocular Movements - Normal + 0 3: Test Visual Fields - Partial Hemianopia + 1 4: Test Facial Palsy (Use Grimace if Obtunded) - Normal symmetry + 0 5A: Test Left Arm Motor Drift - No Drift for 10 Seconds + 0 5B: Test Right Arm Motor Drift - No Drift for 10 Seconds + 0 6A: Test Left Leg Motor Drift - No Drift for 5 Seconds +  0 6B: Test Right Leg Motor Drift - No Drift for 5 Seconds + 0 7: Test Limb Ataxia (FNF/Heel-Shin) - Ataxia in 2 Limbs + 2 8: Test Sensation - Mild-Moderate Loss:  Less Sharp/More Dull + 1 9: Test Language/Aphasia - Normal; No aphasia + 0 10: Test Dysarthria - Normal + 0 11: Test Extinction/Inattention - No abnormality + 0  NIHSS Score: 4 NIHSS Free Text : some difficulty following commands     Patient / Family was informed the Neurology Consult would occur via TeleHealth consult by way of interactive audio and video telecommunications and consented to receiving care in this manner.  Patient is being evaluated for possible acute neurologic impairment and high probability of imminent or life - threatening deterioration.I spent total of 35 minutes providing care to this patient, including time for face to face visit via telemedicine, review of medical records, imaging studies and discussion of findings with providers, the patient and / or family.   Dr Marry Guan   [facility] [phone]  Case [id number]

## 2022-01-19 NOTE — ED Provider Notes (Signed)
[facility] EMERGENCY DEPARTMENT Provider Note   CSN: [id number] Arrival date & time: 01/19/22  1718     History  No chief complaint on file.   Nicole Cobb is a 81 y.o. female.  HPI 81 year old female presents with severe headache and concern for stroke.  Patient tells me the headache started at 4 AM.  Started above her right eye and has moved across her forehead and is also in her occiput and going down her neck.  She has had nausea without vomiting.  She denies any new visual complaints or focal weakness or numbness.  Patient relates that the headache is severe and has been unrelenting ever since.  Daughter at the bedside said she came to check on her and noted that she was having difficulty doing tasks such as working the [employer] remote.  Seem like she might be a little confused/uncoordinated.  Seemed off balance when trying to walk.  Home Medications Prior to Admission medications   Medication Sig Start Date End Date Taking? Authorizing Provider  acetaminophen (TYLENOL) 500 MG tablet Take 500 mg by mouth every 6 (six) hours as needed.   Yes [provider]  alendronate (FOSAMAX) 70 MG tablet Take 70 mg by mouth once a week. 10/30/21  Yes [provider]  ALPRAZolam Prudy Feeler) 0.5 MG tablet Take 1 tablet (0.5 mg total) by mouth 2 (two) times daily as needed for sleep or anxiety. Patient taking differently: Take 0.5 mg by mouth 2 (two) times daily. 09/02/13  Yes Erick Blinks, MD  atenolol (TENORMIN) 50 MG tablet Take 1 tablet (50 mg total) by mouth daily. 07/17/14  Yes Elliot Cousin, MD  calcium carbonate (OS-CAL - DOSED IN MG OF ELEMENTAL CALCIUM) 1250 (500 Ca) MG tablet Take 2 tablets by mouth daily with breakfast.   Yes [provider]  cetirizine (ZYRTEC) 10 MG tablet Take 10 mg by mouth daily as needed for allergies.    Yes [provider]  Cholecalciferol (D3-1000 PO) Take 2,000 mg by mouth daily.    Yes [provider]  clopidogrel  (PLAVIX) 75 MG tablet Take 75 mg by mouth daily.   Yes [provider]  co-enzyme Q-10 50 MG capsule Take 200 mg by mouth at bedtime.    Yes [provider]  fluticasone (FLONASE) 50 MCG/ACT nasal spray Place 1 spray into both nostrils daily as needed.    Yes [provider]  guaiFENesin (MUCINEX) 600 MG 12 hr tablet Take 600 mg by mouth 2 (two) times daily.   Yes [provider]  ibuprofen (ADVIL) 200 MG tablet Take 400 mg by mouth every 6 (six) hours as needed.   Yes [provider]  losartan (COZAAR) 25 MG tablet Take 25 mg by mouth daily.   Yes [provider]  Lutein 20 MG CAPS Take 20 mg by mouth daily.   Yes [provider]  omeprazole (PRILOSEC) 20 MG capsule Take 20 mg by mouth daily.   Yes [provider]  pravastatin (PRAVACHOL) 20 MG tablet TAKE ONE TABLET BY MOUTH EVERY EVENING. 05/07/17  Yes Branch, Dorothe Pea, MD  Pseudoephedrine HCl (SUDAFED PO) Take 1 tablet by mouth daily as needed (congestion).   Yes [provider]  vitamin B-12 (CYANOCOBALAMIN) 500 MCG tablet Take by mouth.   Yes [provider]  pravastatin (PRAVACHOL) 20 MG tablet Take 1 tablet (20 mg total) by mouth every evening. Patient not taking: Reported on 01/19/2022 05/07/17   [name], MD  Allergies    Patient has no known allergies.    Review of Systems   Review of Systems  Gastrointestinal:  Positive for nausea. Negative for vomiting.  Musculoskeletal:  Positive for gait problem. Negative for neck stiffness.  Neurological:  Positive for headaches. Negative for speech difficulty, weakness and numbness.   Physical Exam Updated Vital Signs BP (!) 163/78    Pulse 86    Temp 99.5 F (37.5 C) (Rectal)    Resp 19    SpO2 96%  Physical Exam Vitals and nursing note reviewed.  Constitutional:      Appearance: She is well-developed. She is not diaphoretic.  HENT:     Head: Normocephalic and atraumatic.  Eyes:      Extraocular Movements: Extraocular movements intact.     Pupils: Pupils are equal, round, and reactive to light.  Cardiovascular:     Rate and Rhythm: Normal rate and regular rhythm.     Heart sounds: Normal heart sounds.  Pulmonary:     Effort: Pulmonary effort is normal.     Breath sounds: Normal breath sounds.  Abdominal:     Palpations: Abdomen is soft.     Tenderness: There is no abdominal tenderness.  Musculoskeletal:     Cervical back: Normal range of motion. No rigidity.  Skin:    General: Skin is warm and dry.  Neurological:     Mental Status: She is alert and oriented to person, place, and time.     Comments: Patient is alert and oriented to person place and time.  She has 5/5 strength in all 4 extremities.  Grossly normal sensation.  Seems to overshoot just a little bit with her left hand when doing finger-to-nose.  She also has some difficulty with following commands. Appears to have left-sided visual field deficit    ED Results / Procedures / Treatments   Labs (all labs ordered are listed, but only abnormal results are displayed) Labs Reviewed  CBC - Abnormal; Notable for the following components:      Result Value   WBC 10.6 (*)    All other components within normal limits  COMPREHENSIVE METABOLIC PANEL - Abnormal; Notable for the following components:   Sodium 130 (*)    Chloride 97 (*)    Glucose, Bld 129 (*)    All other components within normal limits  RAPID URINE DRUG SCREEN, HOSP PERFORMED - Abnormal; Notable for the following components:   Benzodiazepines POSITIVE (*)    All other components within normal limits  URINALYSIS, ROUTINE W REFLEX MICROSCOPIC - Abnormal; Notable for the following components:   Ketones, ur 15 (*)    All other components within normal limits  I-STAT CHEM 8, ED - Abnormal; Notable for the following components:   Sodium 133 (*)    Chloride 96 (*)    Glucose, Bld 126 (*)    Calcium, Ion 1.10 (*)    All other components  within normal limits  RESP PANEL BY RT-PCR (FLU A&B, COVID) ARPGX2  ETHANOL  PROTIME-INR  APTT  DIFFERENTIAL    EKG EKG Interpretation  Date/Time:  Sunday January 19 2022 17:29:54 EST Ventricular Rate:  79 PR Interval:  172 QRS Duration: 77 QT Interval:  368 QTC Calculation: 422 R Axis:   30 Text Interpretation: Sinus rhythm Abnormal R-wave progression, early transition no acute ST/T changes similar to 2016 Confirmed by Pricilla LovelessGoldston, Anneke Cundy [phone]([id number]) on 01/19/2022 5:44:43 PM  Radiology CT HEAD WO CONTRAST  Result Date: 01/19/2022 CLINICAL DATA:  Headache.  EXAM: CT HEAD WITHOUT CONTRAST TECHNIQUE: Contiguous axial images were obtained from the base of the skull through the vertex without intravenous contrast. RADIATION DOSE REDUCTION: This exam was performed according to the departmental dose-optimization program which includes automated exposure control, adjustment of the mA and/or kV according to patient size and/or use of iterative reconstruction technique. COMPARISON:  January 06, 2015. FINDINGS: Brain: No evidence of acute infarction, hemorrhage, hydrocephalus, extra-axial collection or mass lesion/mass effect. Vascular: No hyperdense vessel or unexpected calcification. Skull: Normal. Negative for fracture or focal lesion. Sinuses/Orbits: No acute finding. Other: None. IMPRESSION: No acute intracranial abnormality seen. Electronically Signed   By: Lupita Raider M.D.   On: 01/19/2022 18:39   DG Chest Portable 1 View  Result Date: 01/19/2022 CLINICAL DATA:  Altered mental status EXAM: PORTABLE CHEST 1 VIEW COMPARISON:  07/15/2014 FINDINGS: Cardiac shadow is within normal limits. Aortic calcifications are noted. Hiatal hernia is seen. Mild scarring in the left base is noted. No bony abnormality is seen. IMPRESSION: No acute abnormality noted. Electronically Signed   By: Alcide Clever M.D.   On: 01/19/2022 23:33   CT ANGIO HEAD NECK W WO CM W PERF  Result Date: 01/19/2022 CLINICAL DATA:   Headache with encephalopathy EXAM: CT ANGIOGRAPHY HEAD AND NECK CT PERFUSION BRAIN TECHNIQUE: Multidetector CT imaging of the head and neck was performed using the standard protocol during bolus administration of intravenous contrast. Multiplanar CT image reconstructions and MIPs were obtained to evaluate the vascular anatomy. Carotid stenosis measurements (when applicable) are obtained utilizing NASCET criteria, using the distal internal carotid diameter as the denominator. Multiphase CT imaging of the brain was performed following IV bolus contrast injection. Subsequent parametric perfusion maps were calculated using RAPID software. RADIATION DOSE REDUCTION: This exam was performed according to the departmental dose-optimization program which includes automated exposure control, adjustment of the mA and/or kV according to patient size and/or use of iterative reconstruction technique. CONTRAST:  OMNIPAQUE IOHEXOL 350 MG/ML SOLN COMPARISON:  Head CT 01/19/2022 6:29 p.m. FINDINGS: CT Brain Perfusion Findings: ASPECTS: 10 at 6:29 p.m. on 01/19/2022 CBF (<30%) Volume: 6mL Perfusion (Tmax>6.0s) volume: 65mL Mismatch Volume: 17mL Infarction Location:None CTA NECK FINDINGS SKELETON: There is no bony spinal canal stenosis. No lytic or blastic lesion. OTHER NECK: Normal pharynx, larynx and major salivary glands. No cervical lymphadenopathy. Unremarkable thyroid gland. UPPER CHEST: No pneumothorax or pleural effusion. No nodules or masses. AORTIC ARCH: There is calcific atherosclerosis of the aortic arch. There is no aneurysm, dissection or hemodynamically significant stenosis of the visualized portion of the aorta. Conventional 3 vessel aortic branching pattern. The visualized proximal subclavian arteries are widely patent. RIGHT CAROTID SYSTEM: No dissection, occlusion or aneurysm. There is calcified atherosclerosis extending into the proximal ICA, resulting in less than 50% stenosis. LEFT CAROTID SYSTEM: No  dissection, occlusion or aneurysm. Mild atherosclerotic calcification at the carotid bifurcation without hemodynamically significant stenosis. VERTEBRAL ARTERIES: Codominant configuration. The origin of the left vertebral artery is narrowed. There is no dissection, occlusion or flow-limiting stenosis to the skull base (V1-V3 segments). CTA HEAD FINDINGS POSTERIOR CIRCULATION: --Vertebral arteries: Normal V4 segments. --Inferior cerebellar arteries: Normal. --Basilar artery: Normal. --Superior cerebellar arteries: Normal. --Posterior cerebral arteries (PCA): Normal. ANTERIOR CIRCULATION: --Intracranial internal carotid arteries: Atherosclerotic calcification of the internal carotid arteries at the skull base without hemodynamically significant stenosis. --Anterior cerebral arteries (ACA): Normal. Both A1 segments are present. Patent anterior communicating artery (a-comm). --Middle cerebral arteries (MCA): Normal. VENOUS SINUSES: As permitted by contrast timing, patent. ANATOMIC VARIANTS:  Fetal origins of both posterior cerebral arteries. Review of the MIP images confirms the above findings. IMPRESSION: 1. No emergent large vessel occlusion or high-grade stenosis of the intracranial arteries. 2. No infarct by CT perfusion. 3. 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 4. Narrowing of the origin of the left vertebral artery. Aortic Atherosclerosis (ICD10-I70.0). Electronically Signed   By: Deatra RobinsonKevin  Herman M.D.   On: 01/19/2022 19:56    Procedures Procedures    Medications Ordered in ED Medications  valproate (DEPACON) 500 mg in dextrose 5 % 50 mL IVPB (500 mg Intravenous New Bag/Given 01/19/22 2255)  fentaNYL (SUBLIMAZE) injection 50 mcg (50 mcg Intravenous Given 01/19/22 1806)  ondansetron (ZOFRAN) injection 4 mg (4 mg Intravenous Given 01/19/22 1805)  ondansetron (ZOFRAN) injection 4 mg (4 mg Intravenous Given 01/19/22 1917)  iohexol (OMNIPAQUE) 350 MG/ML  injection 100 mL (100 mLs Intravenous Contrast Given 01/19/22 1935)  acetaminophen (TYLENOL) tablet 650 mg (650 mg Oral Given 01/19/22 2046)  meclizine (ANTIVERT) tablet 25 mg (25 mg Oral Given 01/19/22 2046)  alum & mag hydroxide-simeth (MAALOX/MYLANTA) 200-200-20 MG/5ML suspension 30 mL (30 mLs Oral Given 01/19/22 2145)  metoCLOPramide (REGLAN) injection 10 mg (10 mg Intravenous Given 01/19/22 2235)    ED Course/ Medical Decision Making/ A&P                           Medical Decision Making Amount and/or Complexity of Data Reviewed Labs: ordered. Radiology: ordered.  Risk OTC drugs. Prescription drug management. Decision regarding hospitalization.   Patient appears to have a significant headache but no meningismus, fever, or focal weakness on exam.  However she does appear to have a left-sided visual field deficit and some mild difficulty with finger-to-nose on the left side.  CT head obtained and personally reviewed by myself and shows no head bleed.  No masses/edema.  Labs interpreted by myself and are fairly benign besides a minimal leukocytosis.  My suspicion for CNS infections pretty low as she is not truly disoriented but seems to have a difficult time following some commands.  I think bacterial meningitis is unlikely.  I think subarachnoid hemorrhage is unlikely given the negative head CT and negative CTA from a aneurysmal perspective.  There is also no large vessel occlusion.  I had discussed with Dr. Wilford CornerArora briefly for neurology who did advise perfusion as well.  Teleneurology was also consulted and recommends MRI and further work-up for the confusion.  I do not think LP is warranted at this time both as it seems less likely that she has meningitis but also she is taking Plavix and this would be currently contraindicated.  ECG reviewed/interpreted by myself and is similar to 2016 with no ischemic changes.  She was given multiple medicines including IV fentanyl, Zofran, Reglan and Depacon  (last 2 at recommendation of tele-neuro).  With some partial improvement.  She still seems to get nauseous with any minimal movement and I wonder if there is a vertigo component.  Discussed with Dr. Thomes DinningAdefeso for admission.        Final Clinical Impression(s) / ED Diagnoses Final diagnoses:  Severe headache    Rx / DC Orders ED Discharge Orders     None         Pricilla LovelessGoldston, Paislyn Domenico, MD 01/19/22 2348

## 2022-01-19 NOTE — Plan of Care (Signed)
On-call phone note Called by Dr. Criss Alvine at [facility] at 6:50 PM  Patient's last known well sometime last night or early this morning with some nonspecific neurological symptoms but on his exam, Dr. Alric Ran concern is for a left-sided hemianopsia possibly without any weakness on exam. I told him to get a CTA head and neck and perfusion study and if there is deficit, activated telemedicine code stroke.  Patient is outside the window for IV thrombolysis but still might be a candidate for thrombectomy. He will call back and reach out to the telemedicine service as soon as imaging is available.  -- Milon Dikes, MD Neurologist [facility] Pager: [phone]

## 2022-01-19 NOTE — ED Notes (Signed)
Per Dr. Criss Alvine, patient is not a code stroke. Orders to be discontinued.

## 2022-01-19 NOTE — ED Notes (Signed)
Patient transported to CT 

## 2022-01-19 NOTE — ED Triage Notes (Signed)
Per EMS at 4am pt got a headache, family states pt is altered (repeats self), pt was unable to do normal ADL's, pt is nausea  170/95 BP CBG 192 HR 86 98% RA RR 18  Pt states last time this happened she had vertigo

## 2022-01-19 NOTE — H&P (Signed)
History and Physical  Nicole Cobb JXB:[id number] DOB: 1941-02-19 DOA: 01/19/2022  Referring physician: Pricilla LovelessGoldston, Scott, Cobb PCP: Nicole Cobb  Patient coming from: Home  Chief Complaint: Headache  HPI: Nicole GlazierLinda W Cobb is a 81 y.o. female with medical history significant for hypertension, hyperlipidemia, GERD, anxiety who presents to the emergency department due to headache which started around 4 AM, headache started above right eye and extending to the back of the head, it worsens with movement and it was associated with nausea without vomiting.  She called her daughter who activated EMS, BP was checked and was normal, however, daughter was concerned due to patient having difficulty in being able to use the TV remote control and appears to be confused, she was also noted to have gait imbalance on ambulation, so it was decided for her to be taken to the ED for further evaluation and management.  ED Course:  In the emergency department, patient was intermittently tachypneic, BP 158/79, but other vital signs were within normal range.  Work-up in the ED showed normal CBC except for mild leukocytosis, normal BMP except for hyponatremia mild hyperglycemia.  Urinalysis was normal, alcohol level was normal, urine drug screen was positive for benzodiazepine (patient takes Xanax at home for sleep/anxiety).  Influenza A, B, SARS coronavirus 2 was negative. Chest x-ray showed no acute abnormality CT angiography of head and neck showed no emergent large vessel occlusion or high-grade stenosis of the intracranial arteries. CT of head without contrast showed no acute intracranial abnormality She was treated with Tylenol, IV fentanyl, Reglan, Zofran, Mylanta and valproate.  Neurologist was consulted and recommended further stroke work-up.  Hospitalist was asked to admit patient for further evaluation and management.   Review of Systems: A full 10 point Review of Systems was done, except as stated  above, all other Review of systems were negative.  Past Medical History:  Diagnosis Date   Anemia    Anxiety    BPV (benign positional vertigo)    Hiatal hernia    HTN (hypertension)    Hyperlipidemia    Hypokalemia    Vertigo    Past Surgical History:  Procedure Laterality Date   TUBAL LIGATION      Social History:  reports that she has never smoked. She has never used smokeless tobacco. She reports that she does not drink alcohol and does not use drugs.   No Known Allergies  Family History  Problem Relation Age of Onset   Diabetes Mother    Cancer Mother        breast    Heart failure Mother    Cancer Father        lymphoma   Heart failure Brother    Heart attack Brother    Cancer Brother        stomach     Prior to Admission medications   Medication Sig Start Date End Date Taking? Authorizing Provider  acetaminophen (TYLENOL) 500 MG tablet Take 500 mg by mouth every 6 (six) hours as needed.   Yes Nicole Cobb  alendronate (FOSAMAX) 70 MG tablet Take 70 mg by mouth once a week. 10/30/21  Yes Nicole Cobb  ALPRAZolam Prudy Feeler(XANAX) 0.5 MG tablet Take 1 tablet (0.5 mg total) by mouth 2 (two) times daily as needed for sleep or anxiety. Patient taking differently: Take 0.5 mg by mouth 2 (two) times daily. 09/02/13  Yes Nicole Cobb  atenolol (TENORMIN) 50 MG tablet Take 1 tablet (50 mg total) by  mouth daily. 07/17/14  Yes Nicole Cobb  calcium carbonate (OS-CAL - DOSED IN MG OF ELEMENTAL CALCIUM) 1250 (500 Ca) MG tablet Take 2 tablets by mouth daily with breakfast.   Yes Nicole Cobb  cetirizine (ZYRTEC) 10 MG tablet Take 10 mg by mouth daily as needed for allergies.    Yes Nicole Cobb  Cholecalciferol (D3-1000 PO) Take 2,000 mg by mouth daily.    Yes Nicole Cobb  clopidogrel (PLAVIX) 75 MG tablet Take 75 mg by mouth daily.   Yes Nicole Cobb  co-enzyme Q-10 50 MG capsule Take 200 mg by mouth at  bedtime.    Yes Nicole Cobb  fluticasone (FLONASE) 50 MCG/ACT nasal spray Place 1 spray into both nostrils daily as needed.    Yes Nicole Cobb  guaiFENesin (MUCINEX) 600 MG 12 hr tablet Take 600 mg by mouth 2 (two) times daily.   Yes Nicole Cobb  ibuprofen (ADVIL) 200 MG tablet Take 400 mg by mouth every 6 (six) hours as needed.   Yes Nicole Cobb  losartan (COZAAR) 25 MG tablet Take 25 mg by mouth daily.   Yes Nicole Cobb  Lutein 20 MG CAPS Take 20 mg by mouth daily.   Yes Nicole Cobb  omeprazole (PRILOSEC) 20 MG capsule Take 20 mg by mouth daily.   Yes Nicole Cobb  pravastatin (PRAVACHOL) 20 MG tablet TAKE ONE TABLET BY MOUTH EVERY EVENING. 05/07/17  Yes Nicole Cobb  Pseudoephedrine HCl (SUDAFED PO) Take 1 tablet by mouth daily as needed (congestion).   Yes Nicole Cobb  vitamin B-12 (CYANOCOBALAMIN) 500 MCG tablet Take by mouth.   Yes Nicole Cobb  pravastatin (PRAVACHOL) 20 MG tablet Take 1 tablet (20 mg total) by mouth every evening. Patient not taking: Reported on 01/19/2022 05/07/17   Nicole Cobb    Physical Exam: BP 132/68 (BP Location: Left Arm)    Pulse 93    Temp 99 F (37.2 C) (Oral)    Resp 16    Ht 5\' 4"  (1.626 m)    Wt 72.1 kg    SpO2 100%    BMI 27.28 kg/m   General: 81 y.o. year-old female well developed well nourished in no acute distress.  Alert and oriented x3. HEENT: NCAT, EOMI Neck: Supple, trachea medial Cardiovascular: Regular rate and rhythm with no rubs or gallops.  No thyromegaly or JVD noted.  No lower extremity edema. 2/4 pulses in all 4 extremities. Respiratory: Clear to auscultation with no wheezes or rales. Good inspiratory effort. Abdomen: Soft, nontender nondistended with normal bowel sounds x4 quadrants. Muskuloskeletal: No cyanosis, clubbing or edema noted bilaterally Neuro: CN II-XII intact, strength 5/5 x 4, sensation,  reflexes intact Skin: No ulcerative lesions noted or rashes Psychiatry: Judgement and insight appear normal. Mood is appropriate for condition and setting          Labs on Admission:  Basic Metabolic Panel: Recent Labs  Lab 01/19/22 1757 01/19/22 1806  NA 130* 133*  K 3.6 3.8  CL 97* 96*  CO2 26  --   GLUCOSE 129* 126*  BUN 17 16  CREATININE 0.68 0.70  CALCIUM 9.1  --    Liver Function Tests: Recent Labs  Lab 01/19/22 1757  AST 18  ALT 16  ALKPHOS 53  BILITOT 0.8  PROT 7.3  ALBUMIN 4.4   No results for input(s): LIPASE, AMYLASE in the last 168 hours. No results for  input(s): AMMONIA in the last 168 hours. CBC: Recent Labs  Lab 01/19/22 1757 01/19/22 1806  WBC 10.6*  --   NEUTROABS 7.6  --   HGB 12.8 13.3  HCT 37.5 39.0  MCV 89.1  --   PLT 223  --    Cardiac Enzymes: No results for input(s): CKTOTAL, CKMB, CKMBINDEX, TROPONINI in the last 168 hours.  BNP (last 3 results) No results for input(s): BNP in the last 8760 hours.  ProBNP (last 3 results) No results for input(s): PROBNP in the last 8760 hours.  CBG: No results for input(s): GLUCAP in the last 168 hours.  Radiological Exams on Admission: CT HEAD WO CONTRAST  Result Date: 01/19/2022 CLINICAL DATA:  Headache. EXAM: CT HEAD WITHOUT CONTRAST TECHNIQUE: Contiguous axial images were obtained from the base of the skull through the vertex without intravenous contrast. RADIATION DOSE REDUCTION: This exam was performed according to the departmental dose-optimization program which includes automated exposure control, adjustment of the mA and/or kV according to patient size and/or use of iterative reconstruction technique. COMPARISON:  January 06, 2015. FINDINGS: Brain: No evidence of acute infarction, hemorrhage, hydrocephalus, extra-axial collection or mass lesion/mass effect. Vascular: No hyperdense vessel or unexpected calcification. Skull: Normal. Negative for fracture or focal lesion. Sinuses/Orbits: No  acute finding. Other: None. IMPRESSION: No acute intracranial abnormality seen. Electronically Signed   By: Lupita RaiderJames  Green Jr M.D.   On: 01/19/2022 18:39   DG Chest Portable 1 View  Result Date: 01/19/2022 CLINICAL DATA:  Altered mental status EXAM: PORTABLE CHEST 1 VIEW COMPARISON:  07/15/2014 FINDINGS: Cardiac shadow is within normal limits. Aortic calcifications are noted. Hiatal hernia is seen. Mild scarring in the left base is noted. No bony abnormality is seen. IMPRESSION: No acute abnormality noted. Electronically Signed   By: Alcide CleverMark  Lukens M.D.   On: 01/19/2022 23:33   CT ANGIO HEAD NECK W WO CM W PERF  Result Date: 01/19/2022 CLINICAL DATA:  Headache with encephalopathy EXAM: CT ANGIOGRAPHY HEAD AND NECK CT PERFUSION BRAIN TECHNIQUE: Multidetector CT imaging of the head and neck was performed using the standard protocol during bolus administration of intravenous contrast. Multiplanar CT image reconstructions and MIPs were obtained to evaluate the vascular anatomy. Carotid stenosis measurements (when applicable) are obtained utilizing NASCET criteria, using the distal internal carotid diameter as the denominator. Multiphase CT imaging of the brain was performed following IV bolus contrast injection. Subsequent parametric perfusion maps were calculated using RAPID software. RADIATION DOSE REDUCTION: This exam was performed according to the departmental dose-optimization program which includes automated exposure control, adjustment of the mA and/or kV according to patient size and/or use of iterative reconstruction technique. CONTRAST:  100mL OMNIPAQUE IOHEXOL 350 MG/ML SOLN COMPARISON:  Head CT 01/19/2022 6:29 p.m. FINDINGS: CT Brain Perfusion Findings: ASPECTS: 10 at 6:29 p.m. on 01/19/2022 CBF (<30%) Volume: 0mL Perfusion (Tmax>6.0s) volume: 0mL Mismatch Volume: 0mL Infarction Location:None CTA NECK FINDINGS SKELETON: There is no bony spinal canal stenosis. No lytic or blastic lesion. OTHER NECK:  Normal pharynx, larynx and major salivary glands. No cervical lymphadenopathy. Unremarkable thyroid gland. UPPER CHEST: No pneumothorax or pleural effusion. No nodules or masses. AORTIC ARCH: There is calcific atherosclerosis of the aortic arch. There is no aneurysm, dissection or hemodynamically significant stenosis of the visualized portion of the aorta. Conventional 3 vessel aortic branching pattern. The visualized proximal subclavian arteries are widely patent. RIGHT CAROTID SYSTEM: No dissection, occlusion or aneurysm. There is calcified atherosclerosis extending into the proximal ICA, resulting in less than 50%  stenosis. LEFT CAROTID SYSTEM: No dissection, occlusion or aneurysm. Mild atherosclerotic calcification at the carotid bifurcation without hemodynamically significant stenosis. VERTEBRAL ARTERIES: Codominant configuration. The origin of the left vertebral artery is narrowed. There is no dissection, occlusion or flow-limiting stenosis to the skull base (V1-V3 segments). CTA HEAD FINDINGS POSTERIOR CIRCULATION: --Vertebral arteries: Normal V4 segments. --Inferior cerebellar arteries: Normal. --Basilar artery: Normal. --Superior cerebellar arteries: Normal. --Posterior cerebral arteries (PCA): Normal. ANTERIOR CIRCULATION: --Intracranial internal carotid arteries: Atherosclerotic calcification of the internal carotid arteries at the skull base without hemodynamically significant stenosis. --Anterior cerebral arteries (ACA): Normal. Both A1 segments are present. Patent anterior communicating artery (a-comm). --Middle cerebral arteries (MCA): Normal. VENOUS SINUSES: As permitted by contrast timing, patent. ANATOMIC VARIANTS: Fetal origins of both posterior cerebral arteries. Review of the MIP images confirms the above findings. IMPRESSION: 1. No emergent large vessel occlusion or high-grade stenosis of the intracranial arteries. 2. No infarct by CT perfusion. 3. Bilateral carotid bifurcation atherosclerosis  without hemodynamically significant stenosis. 4. Narrowing of the origin of the left vertebral artery. Aortic Atherosclerosis (ICD10-I70.0). Electronically Signed   By: Deatra Robinson M.D.   On: 01/19/2022 19:56    EKG: I independently viewed the EKG done and my findings are as followed: Normal sinus rhythm at a rate of 79 bpm with no acute ST/T wave abnormalities  Assessment/Plan Present on Admission:  Headache  Hyponatremia  Essential hypertension, benign  Anxiety state  Principal Problem:   Headache Active Problems:   Essential hypertension, benign   Anxiety state   Hyponatremia   Leukocytosis   Hyperlipidemia, unspecified   GERD (gastroesophageal reflux disease)   Headache and confusion, rule out acute ischemic stroke Patient will be admitted to telemetry unit  Echocardiogram in the morning MRI of brain without contrast in the morning Continue aspirin, Plavix and statin Continue fall precautions and neuro checks Lipid panel and hemoglobin A1c will be checked Continue PT/OT eval and treat Patient passed bedside swallow eval by nursing  Teleneurology was consulted and we shall await further recommendation   Leukocytosis possibly reactive WBC 10.6, continue to monitor WBC with morning labs  Hyponatremia Na 130, continue gentle hydration Continue to monitor sodium levels with morning labs  Hypertension Permissive hypertension will be allowed at this time  Antihypertensives PRN if Blood pressure is greater than 220/120 or there is a concern for End organ damage/contraindications for permissive HTN. If blood pressure is greater than 220/120 give labetalol PO or IV or Vasotec IV with a goal of 15% reduction in BP during the first 24 hours.  Hyperlipidemia Continue statin  GERD Continue PPI  Anxiety Continue Xanax    DVT prophylaxis: SCDs  Code Status: Full code  Family Communication: None at bedside  Disposition Plan:  Patient is from:                         home Anticipated DC to:                   SNF or family members home Anticipated DC date:               1-2 Anticipated DC barriers:         Patient requires inpatient management due to pending work-up for stroke rule out  Consults called: Neurology  Admission status: Observation    Frankey Shown Cobb [facility]   01/20/2022, 3:04 AM

## 2022-01-20 ENCOUNTER — Observation Stay (HOSPITAL_COMMUNITY): Payer: Medicare Other

## 2022-01-20 DIAGNOSIS — R29704 NIHSS score 4: Secondary | ICD-10-CM | POA: Diagnosis present

## 2022-01-20 DIAGNOSIS — R519 Headache, unspecified: Secondary | ICD-10-CM | POA: Diagnosis present

## 2022-01-20 DIAGNOSIS — G43109 Migraine with aura, not intractable, without status migrainosus: Secondary | ICD-10-CM | POA: Diagnosis not present

## 2022-01-20 DIAGNOSIS — K219 Gastro-esophageal reflux disease without esophagitis: Secondary | ICD-10-CM | POA: Diagnosis present

## 2022-01-20 DIAGNOSIS — E781 Pure hyperglyceridemia: Secondary | ICD-10-CM | POA: Diagnosis present

## 2022-01-20 DIAGNOSIS — Z79899 Other long term (current) drug therapy: Secondary | ICD-10-CM | POA: Diagnosis not present

## 2022-01-20 DIAGNOSIS — H53462 Homonymous bilateral field defects, left side: Secondary | ICD-10-CM | POA: Diagnosis present

## 2022-01-20 DIAGNOSIS — I63411 Cerebral infarction due to embolism of right middle cerebral artery: Secondary | ICD-10-CM | POA: Diagnosis not present

## 2022-01-20 DIAGNOSIS — Z8249 Family history of ischemic heart disease and other diseases of the circulatory system: Secondary | ICD-10-CM | POA: Diagnosis not present

## 2022-01-20 DIAGNOSIS — G9341 Metabolic encephalopathy: Secondary | ICD-10-CM | POA: Diagnosis present

## 2022-01-20 DIAGNOSIS — I639 Cerebral infarction, unspecified: Secondary | ICD-10-CM | POA: Diagnosis not present

## 2022-01-20 DIAGNOSIS — Z807 Family history of other malignant neoplasms of lymphoid, hematopoietic and related tissues: Secondary | ICD-10-CM | POA: Diagnosis not present

## 2022-01-20 DIAGNOSIS — E785 Hyperlipidemia, unspecified: Secondary | ICD-10-CM | POA: Diagnosis present

## 2022-01-20 DIAGNOSIS — R270 Ataxia, unspecified: Secondary | ICD-10-CM | POA: Diagnosis present

## 2022-01-20 DIAGNOSIS — F411 Generalized anxiety disorder: Secondary | ICD-10-CM | POA: Diagnosis present

## 2022-01-20 DIAGNOSIS — Z20822 Contact with and (suspected) exposure to covid-19: Secondary | ICD-10-CM | POA: Diagnosis present

## 2022-01-20 DIAGNOSIS — R739 Hyperglycemia, unspecified: Secondary | ICD-10-CM | POA: Diagnosis present

## 2022-01-20 DIAGNOSIS — I6350 Cerebral infarction due to unspecified occlusion or stenosis of unspecified cerebral artery: Secondary | ICD-10-CM | POA: Diagnosis present

## 2022-01-20 DIAGNOSIS — R509 Fever, unspecified: Secondary | ICD-10-CM | POA: Diagnosis present

## 2022-01-20 DIAGNOSIS — I2510 Atherosclerotic heart disease of native coronary artery without angina pectoris: Secondary | ICD-10-CM | POA: Diagnosis present

## 2022-01-20 DIAGNOSIS — E222 Syndrome of inappropriate secretion of antidiuretic hormone: Secondary | ICD-10-CM | POA: Diagnosis present

## 2022-01-20 DIAGNOSIS — D72829 Elevated white blood cell count, unspecified: Secondary | ICD-10-CM

## 2022-01-20 DIAGNOSIS — E871 Hypo-osmolality and hyponatremia: Secondary | ICD-10-CM | POA: Diagnosis not present

## 2022-01-20 DIAGNOSIS — I6782 Cerebral ischemia: Secondary | ICD-10-CM | POA: Diagnosis not present

## 2022-01-20 DIAGNOSIS — I6389 Other cerebral infarction: Secondary | ICD-10-CM

## 2022-01-20 DIAGNOSIS — I10 Essential (primary) hypertension: Secondary | ICD-10-CM | POA: Diagnosis not present

## 2022-01-20 DIAGNOSIS — I63511 Cerebral infarction due to unspecified occlusion or stenosis of right middle cerebral artery: Secondary | ICD-10-CM | POA: Diagnosis not present

## 2022-01-20 DIAGNOSIS — Z7983 Long term (current) use of bisphosphonates: Secondary | ICD-10-CM | POA: Diagnosis not present

## 2022-01-20 DIAGNOSIS — Z833 Family history of diabetes mellitus: Secondary | ICD-10-CM | POA: Diagnosis not present

## 2022-01-20 DIAGNOSIS — Z8673 Personal history of transient ischemic attack (TIA), and cerebral infarction without residual deficits: Secondary | ICD-10-CM | POA: Diagnosis not present

## 2022-01-20 DIAGNOSIS — Z7902 Long term (current) use of antithrombotics/antiplatelets: Secondary | ICD-10-CM | POA: Diagnosis not present

## 2022-01-20 LAB — ECHOCARDIOGRAM COMPLETE
AR max vel: 2.44 cm2
AV Area VTI: 2.55 cm2
AV Area mean vel: 2.53 cm2
AV Mean grad: 3 mmHg
AV Peak grad: 7.7 mmHg
Ao pk vel: 1.39 m/s
Area-P 1/2: 3.31 cm2
Calc EF: 56.2 %
Height: 64 in
MV VTI: 2.26 cm2
P 1/2 time: 521 msec
S' Lateral: 2.8 cm
Single Plane A2C EF: 55.3 %
Single Plane A4C EF: 55.2 %
Weight: 2543.23 oz

## 2022-01-20 LAB — COMPREHENSIVE METABOLIC PANEL
ALT: 17 U/L (ref 0–44)
AST: 22 U/L (ref 15–41)
Albumin: 4.2 g/dL (ref 3.5–5.0)
Alkaline Phosphatase: 50 U/L (ref 38–126)
Anion gap: 13 (ref 5–15)
BUN: 15 mg/dL (ref 8–23)
CO2: 21 mmol/L — ABNORMAL LOW (ref 22–32)
Calcium: 9.2 mg/dL (ref 8.9–10.3)
Chloride: 98 mmol/L (ref 98–111)
Creatinine, Ser: 0.69 mg/dL (ref 0.44–1.00)
GFR, Estimated: >60 mL/min (ref >60)
Glucose, Bld: 137 mg/dL — ABNORMAL HIGH (ref 70–99)
Potassium: 3.9 mmol/L (ref 3.5–5.1)
Sodium: 132 mmol/L — ABNORMAL LOW (ref 135–145)
Total Bilirubin: 0.8 mg/dL (ref 0.3–1.2)
Total Protein: 7.1 g/dL (ref 6.5–8.1)

## 2022-01-20 LAB — CBC
HCT: 38.7 % (ref 36.0–46.0)
Hemoglobin: 13 g/dL (ref 12.0–15.0)
MCH: 31.2 pg (ref 26.0–34.0)
MCHC: 33.6 g/dL (ref 30.0–36.0)
MCV: 92.8 fL (ref 80.0–100.0)
Platelets: 219 10*3/uL (ref 150–400)
RBC: 4.17 MIL/uL (ref 3.87–5.11)
RDW: 12.3 % (ref 11.5–15.5)
WBC: 12.8 10*3/uL — ABNORMAL HIGH (ref 4.0–10.5)
nRBC: 0 % (ref 0.0–0.2)

## 2022-01-20 LAB — OSMOLALITY: Osmolality: 280 mOsm/kg (ref 275–295)

## 2022-01-20 LAB — LIPID PANEL
Cholesterol: 156 mg/dL (ref 0–200)
HDL: 41 mg/dL (ref >40)
LDL Cholesterol: 82 mg/dL (ref 0–99)
Total CHOL/HDL Ratio: 3.8 RATIO
Triglycerides: 165 mg/dL — ABNORMAL HIGH (ref <150)
VLDL: 33 mg/dL (ref 0–40)

## 2022-01-20 LAB — T4, FREE: Free T4: 0.87 ng/dL (ref 0.61–1.12)

## 2022-01-20 LAB — VITAMIN B12: Vitamin B-12: 1728 pg/mL — ABNORMAL HIGH (ref 180–914)

## 2022-01-20 LAB — FOLATE: Folate: 11.6 ng/mL (ref >5.9)

## 2022-01-20 LAB — HEMOGLOBIN A1C
Hgb A1c MFr Bld: 5.4 % (ref 4.8–5.6)
Mean Plasma Glucose: 108.28 mg/dL

## 2022-01-20 LAB — SODIUM, URINE, RANDOM: Sodium, Ur: 62 mmol/L

## 2022-01-20 LAB — MAGNESIUM: Magnesium: 2.1 mg/dL (ref 1.7–2.4)

## 2022-01-20 LAB — PROCALCITONIN: Procalcitonin: <0.1 ng/mL

## 2022-01-20 LAB — PHOSPHORUS: Phosphorus: 2.6 mg/dL (ref 2.5–4.6)

## 2022-01-20 LAB — CREATININE, URINE, RANDOM: Creatinine, Urine: 79.96 mg/dL

## 2022-01-20 LAB — OSMOLALITY, URINE: Osmolality, Ur: 591 mOsm/kg (ref 300–900)

## 2022-01-20 LAB — TSH: TSH: 1.62 u[IU]/mL (ref 0.350–4.500)

## 2022-01-20 MED ORDER — ALPRAZOLAM 0.5 MG PO TABS
0.5000 mg | ORAL_TABLET | Freq: Two times a day (BID) | ORAL | Status: DC | PRN
  Administered 2022-01-21 (×2): 0.5 mg via ORAL
  Filled 2022-01-20 (×2): qty 1

## 2022-01-20 MED ORDER — ATORVASTATIN CALCIUM 40 MG PO TABS
40.0000 mg | ORAL_TABLET | Freq: Every day | ORAL | Status: DC
  Administered 2022-01-20: 40 mg via ORAL
  Filled 2022-01-20: qty 1

## 2022-01-20 MED ORDER — ASPIRIN 325 MG PO TABS
325.0000 mg | ORAL_TABLET | Freq: Every day | ORAL | Status: DC
  Administered 2022-01-20 – 2022-01-21 (×2): 325 mg via ORAL
  Filled 2022-01-20 (×2): qty 1

## 2022-01-20 MED ORDER — SODIUM CHLORIDE 0.9 % IV SOLN: INTRAVENOUS | Status: AC

## 2022-01-20 MED ORDER — KETOROLAC TROMETHAMINE 15 MG/ML IJ SOLN
15.0000 mg | Freq: Once | INTRAMUSCULAR | Status: AC
  Administered 2022-01-20: 15 mg via INTRAVENOUS
  Filled 2022-01-20: qty 1

## 2022-01-20 MED ORDER — PANTOPRAZOLE SODIUM 40 MG PO TBEC
40.0000 mg | DELAYED_RELEASE_TABLET | Freq: Every day | ORAL | Status: DC
  Administered 2022-01-20 – 2022-01-21 (×2): 40 mg via ORAL
  Filled 2022-01-20 (×2): qty 1

## 2022-01-20 MED ORDER — ACETAMINOPHEN 325 MG PO TABS
650.0000 mg | ORAL_TABLET | Freq: Four times a day (QID) | ORAL | Status: DC | PRN
  Administered 2022-01-20 – 2022-01-21 (×2): 650 mg via ORAL
  Filled 2022-01-20 (×2): qty 2

## 2022-01-20 MED ORDER — PRAVASTATIN SODIUM 10 MG PO TABS: 20.0000 mg | ORAL_TABLET | Freq: Every evening | ORAL | Status: DC

## 2022-01-20 MED ORDER — CLOPIDOGREL BISULFATE 75 MG PO TABS
75.0000 mg | ORAL_TABLET | Freq: Every day | ORAL | Status: DC
  Administered 2022-01-20: 75 mg via ORAL
  Filled 2022-01-20: qty 1

## 2022-01-20 MED ORDER — CLOPIDOGREL BISULFATE 75 MG PO TABS
75.0000 mg | ORAL_TABLET | Freq: Every day | ORAL | Status: DC
Start: 2022-01-21 — End: 2022-01-21
  Administered 2022-01-21: 75 mg via ORAL
  Filled 2022-01-20: qty 1

## 2022-01-20 MED ORDER — ONDANSETRON HCL 4 MG/2ML IJ SOLN
4.0000 mg | Freq: Four times a day (QID) | INTRAMUSCULAR | Status: DC | PRN
  Filled 2022-01-20: qty 2

## 2022-01-20 NOTE — Evaluation (Signed)
Physical Therapy Evaluation Patient Details Name: Nicole Cobb MRN: [id number] DOB: [birth year] Today's Date: 01/20/2022  History of Present Illness  Nicole Cobb is a 81 y.o. female with medical history significant for hypertension, hyperlipidemia, GERD, anxiety who presents to the emergency department due to headache which started around 4 AM, headache started above right eye and extending to the back of the head, it worsens with movement and it was associated with nausea without vomiting.  She called her daughter who activated EMS, BP was checked and was normal, however, daughter was concerned due to patient having difficulty in being able to use the TV remote control and appears to be confused, she was also noted to have gait imbalance on ambulation, so it was decided for her to be taken to the ED for further evaluation and management.   Clinical Impression  Patient functioning near baseline for functional mobility and gait other than appearing to have some visual deficits when being tested for peripheral vision requiring repeated verbal cueing to follow directions but states she can see normally after correctly following instructions.  Patient demonstrates good return for ambulating in room and hallways without loss of balance without use of an AD, but occasional leaning on nearby objects possibly more for proprioceptive feed back than limited balance and patient states she normally leans on walls and furniture at home.  Plan:  Patient discharged from physical therapy to care of nursing for ambulation daily as tolerated for length of stay.           Recommendations for follow up therapy are one component of a multi-disciplinary discharge planning process, led by the attending physician.  Recommendations may be updated based on patient status, additional functional criteria and insurance authorization.  Follow Up Recommendations No PT follow up    Assistance Recommended at Discharge PRN   Patient can return home with the following  A little help with bathing/dressing/bathroom;A little help with walking and/or transfers;Help with stairs or ramp for entrance    Equipment Recommendations None recommended by PT  Recommendations for Other Services       Functional Status Assessment Patient has not had a recent decline in their functional status     Precautions / Restrictions Precautions Precautions: Other (comment) Precaution Comments: Visual impairment Restrictions Weight Bearing Restrictions: No      Mobility  Bed Mobility Overal bed mobility: Modified Independent                  Transfers                   General transfer comment: as per OT notes    Ambulation/Gait Ambulation/Gait assistance: Supervision Gait Distance (Feet): 100 Feet Assistive device: None Gait Pattern/deviations: Decreased step length - left, Decreased stance time - right, Decreased stride length Gait velocity: decreased     General Gait Details: slightly labored cadence with occasional leaning on nearby objects for support without loss of balance  Stairs            Wheelchair Mobility    Modified Rankin (Stroke Patients Only)       Balance Overall balance assessment: Mild deficits observed, not formally tested                                           Pertinent Vitals/Pain Pain Assessment Pain Assessment: 0-10 Pain Score: 6  Pain Location: headache - forehead region above her right eye radiating across towards the left side. Pain Descriptors / Indicators: Aching Pain Intervention(s): Limited activity within patient's tolerance, Monitored during session    Home Living Family/patient expects to be discharged to:: Private residence Living Arrangements: Alone Available Help at Discharge: Family;Available PRN/intermittently Type of Home: House Home Access: Ramped entrance       Home Layout: One level Home Equipment: Clinical biochemist (2 wheels);Cane - single point;Shower seat;BSC/3in1      Prior Function Prior Level of Function : Independent/Modified Independent             Mobility Comments: Tourist information centre manager, drives ADLs Comments: Patient reports that she drives and is able to complete all daily tasks. She uses a shower chair. She does not use a BSC, cane, or RW. 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     Hand Dominance   Dominant Hand: Right    Extremity/Trunk Assessment   Upper Extremity Assessment Upper Extremity Assessment: Defer to OT evaluation    Lower Extremity Assessment Lower Extremity Assessment: Overall WFL for tasks assessed    Cervical / Trunk Assessment Cervical / Trunk Assessment: Normal  Communication   Communication: No difficulties  Cognition Arousal/Alertness: Awake/alert Behavior During Therapy: WFL for tasks assessed/performed Overall Cognitive Status: No family/caregiver present to determine baseline cognitive functioning                                 General Comments: occasional repeated verbal cueing to follow directions        General Comments      Exercises     Assessment/Plan    PT Assessment Patient does not need any further PT services  PT Problem List         PT Treatment Interventions      PT Goals (Current goals can be found in the Care Plan section)  Acute Rehab PT Goals Patient Stated Goal: return home with family to assist PT Goal Formulation: With patient Time For Goal Achievement: 01/20/22 Potential to Achieve Goals: Good    Frequency       Co-evaluation PT/OT/SLP Co-Evaluation/Treatment: Yes Reason for Co-Treatment: To address functional/ADL transfers PT goals addressed during session: Mobility/safety with mobility;Balance         AM-PAC PT "6 Clicks" Mobility  Outcome Measure Help needed turning from your back to your side while in a flat bed without using bedrails?:  None Help needed moving from lying on your back to sitting on the side of a flat bed without using bedrails?: None Help needed moving to and from a bed to a chair (including a wheelchair)?: None Help needed standing up from a chair using your arms (e.g., wheelchair or bedside chair)?: None Help needed to walk in hospital room?: A Little Help needed climbing 3-5 steps with a railing? : A Little 6 Click Score: 22    End of Session   Activity Tolerance: Patient tolerated treatment well Patient left: in bed;with call bell/phone within reach Nurse Communication: Mobility status PT Visit Diagnosis: Unsteadiness on feet (R26.81);Other abnormalities of gait and mobility (R26.89);Muscle weakness (generalized) (M62.81)    Time: 8938-1017 PT Time Calculation (min) (ACUTE ONLY): 26 min   Charges:   PT Evaluation $PT Eval Moderate Complexity: 1 Mod PT Treatments $Therapeutic Activity: 23-37 mins        2:27 PM, 01/20/22 Ocie Bob, MPT Physical  Therapist with [facility] [phone] office [phone] mobile phone

## 2022-01-20 NOTE — Progress Notes (Addendum)
I connected with  Nicole Cobb on 01/20/22 by a video enabled telemedicine application and verified that I am speaking with the correct person using two identifiers.   I discussed the limitations of evaluation and management by telemedicine. The patient expressed understanding and agreed to proceed.  Location of patient: [facility] Location of physician: [facility]   Neurology Consultation Reason for Consult: Stroke Referring Physician: Dr. Shanon Brow Tat  CC: Headache, vision changes  History is obtained from: Patient, daughter at bedside  HPI: Nicole Cobb is a 81 y.o. female with past medical history of hypertension, stroke who presented with sudden onset of confusion, vision changes and headache.  Telestroke was consulted and recommended MRI brain which showed acute stroke   Last known normal 4 AM on 01/19/2022.   Patient was at home.   tPA not administered at outside window.   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  Blood pressure on arrival 170/95.   MRS at baseline 0.   NIHSS on arrival was 4.   ROS: All other systems reviewed and negative except as noted in the HPI.   Past Medical History:  Diagnosis Date   Anemia    Anxiety    BPV (benign positional vertigo)    Hiatal hernia    HTN (hypertension)    Hyperlipidemia    Hypokalemia    Vertigo      Family History  Problem Relation Age of Onset   Diabetes Mother    Cancer Mother        breast    Heart failure Mother    Cancer Father        lymphoma   Heart failure Brother    Heart attack Brother    Cancer Brother        stomach    Social History:  reports that she has never smoked. She has never used smokeless tobacco. She reports that she does not drink alcohol and does not use drugs.   Medications Prior to Admission  Medication Sig Dispense Refill Last Dose   acetaminophen (TYLENOL) 500 MG tablet Take 500 mg by mouth every 6 (six) hours as needed.   01/19/2022   alendronate  (FOSAMAX) 70 MG tablet Take 70 mg by mouth once a week.   01/17/2022   ALPRAZolam (XANAX) 0.5 MG tablet Take 1 tablet (0.5 mg total) by mouth 2 (two) times daily as needed for sleep or anxiety. (Patient taking differently: Take 0.5 mg by mouth 2 (two) times daily.) 30 tablet 0 01/19/2022   atenolol (TENORMIN) 50 MG tablet Take 1 tablet (50 mg total) by mouth daily. 30 tablet 3 01/19/2022 at 07   calcium carbonate (OS-CAL - DOSED IN MG OF ELEMENTAL CALCIUM) 1250 (500 Ca) MG tablet Take 2 tablets by mouth daily with breakfast.   01/18/2022   cetirizine (ZYRTEC) 10 MG tablet Take 10 mg by mouth daily as needed for allergies.    01/18/2022   Cholecalciferol (D3-1000 PO) Take 2,000 mg by mouth daily.    01/18/2022   clopidogrel (PLAVIX) 75 MG tablet Take 75 mg by mouth daily.   01/19/2022 at 0700   co-enzyme Q-10 50 MG capsule Take 200 mg by mouth at bedtime.    01/18/2022   fluticasone (FLONASE) 50 MCG/ACT nasal spray Place 1 spray into both nostrils daily as needed.    01/19/2022   guaiFENesin (MUCINEX) 600 MG 12 hr tablet Take 600 mg by mouth 2 (two) times daily.   01/19/2022  ibuprofen (ADVIL) 200 MG tablet Take 400 mg by mouth every 6 (six) hours as needed.   01/19/2022   losartan (COZAAR) 25 MG tablet Take 25 mg by mouth daily.   01/19/2022   Lutein 20 MG CAPS Take 20 mg by mouth daily.   01/18/2022   omeprazole (PRILOSEC) 20 MG capsule Take 20 mg by mouth daily.   01/18/2022   pravastatin (PRAVACHOL) 20 MG tablet TAKE ONE TABLET BY MOUTH EVERY EVENING. 90 tablet 0 01/18/2022   Pseudoephedrine HCl (SUDAFED PO) Take 1 tablet by mouth daily as needed (congestion).   01/19/2022   vitamin B-12 (CYANOCOBALAMIN) 500 MCG tablet Take by mouth.   01/18/2022   pravastatin (PRAVACHOL) 20 MG tablet Take 1 tablet (20 mg total) by mouth every evening. (Patient not taking: Reported on 01/19/2022) 90 tablet 3 Not Taking      Exam: Current vital signs: BP 119/67 (BP Location: Left Arm)    Pulse 76    Temp 98 F (36.7 C)  (Axillary)    Resp 14    Ht 5\' 4"  (1.626 m)    Wt 72.1 kg    SpO2 100%    BMI 27.28 kg/m  Vital signs in last 24 hours: Temp:  [98 F (36.7 C)-99.5 F (37.5 C)] 98 F (36.7 C) (01/30 0830) Pulse Rate:  [75-96] 76 (01/30 1203) Resp:  [11-21] 14 (01/30 1203) BP: (61-163)/(47-108) 119/67 (01/30 1203) SpO2:  [94 %-100 %] 100 % (01/30 1203) Weight:  [72.1 kg] 72.1 kg (01/30 0011)   Physical Exam  Constitutional: Appears well-developed and well-nourished.  Psych: Affect appropriate to situation Eyes: No scleral injection Respiratory: Effort normal, non-labored breathing Neuro: AOx3, left hemianopsia, rest of the cranial nerves appear grossly intact, antigravity strength in all 4 extremities without drift  I have reviewed labs in epic and the results pertinent to this consultation are: CBC:  Recent Labs  Lab 01/19/22 1757 01/19/22 1806 01/20/22 0246  WBC 10.6*  --  12.8*  NEUTROABS 7.6  --   --   HGB 12.8 13.3 13.0  HCT 37.5 39.0 38.7  MCV 89.1  --  92.8  PLT 223  --  [id number]    Basic Metabolic Panel:  Lab Results  Component Value Date   NA 132 (L) 01/20/2022   K 3.9 01/20/2022   CO2 21 (L) 01/20/2022   GLUCOSE 137 (H) 01/20/2022   BUN 15 01/20/2022   CREATININE 0.69 01/20/2022   CALCIUM 9.2 01/20/2022   GFRNONAA >60 01/20/2022   GFRAA >90 01/06/2015   Lipid Panel:  Lab Results  Component Value Date   LDLCALC 82 01/20/2022   HgbA1c:  Lab Results  Component Value Date   HGBA1C 5.4 01/20/2022   Urine Drug Screen:     Component Value Date/Time   LABOPIA NONE DETECTED 01/19/2022 2131   COCAINSCRNUR NONE DETECTED 01/19/2022 2131   LABBENZ POSITIVE (A) 01/19/2022 2131   AMPHETMU NONE DETECTED 01/19/2022 2131   THCU NONE DETECTED 01/19/2022 2131   LABBARB NONE DETECTED 01/19/2022 2131    Alcohol Level     Component Value Date/Time   ETH <10 01/19/2022 1757     I have reviewed the images obtained:  MRI brain without contrast 01/20/2022: 13 mm acute  cortical/subcortical infarct within the right parietal lobe (at the junction of the right MCA and PCA vascular territories). Background mild chronic small vessel ischemic changes within the cerebral white matter. 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  CTA head and  neck 01/19/2022:No emergent large vessel occlusion or high-grade stenosis of the intracranial arteries. No infarct by CT perfusion.Bilateral carotid bifurcation atherosclerosis without hemodynamically significant stenosis. Narrowing of the origin of the left vertebral artery.   ASSESSMENT/PLAN: 81 year old female with acute ischemic infarct  Acute ischemic infarct, right Left hemianopia Headache Hyper triglyceridemia -Etiology: Suspected cardioembolic  Recommendations: - Patient was already on plavix, recommend continuing for 3 weeks - Start aspirin 325 mg daily for secondary stroke prevention -Switch pravastatin to atorvastatin 40 mg daily for secondary stroke prevention -TEE performed, report pending.  If no evidence of thrombus, recommend 30-day event monitor to look for paroxysmal A. fib -Patient still has left hemianopsia.  Therefore counseled about driving restrictions. -Okay to gradually normalize blood pressure with goal normotension -Counseled about stroke risk factors -Counseled about BEFAST - One time pose pf IV toradol for headache -Follow-up with neurology in 1 month -Management of rest of comorbidities per primary team   Thank you for allowing [place] to participate in the care of this patient. If you have any further questions, please contact  me or neurohospitalist.   Zeb Comfort [facility]ist

## 2022-01-20 NOTE — Evaluation (Signed)
Occupational Therapy Evaluation Patient Details Name: Nicole Cobb MRN: [id number] DOB: September 29, 1941 Today's Date: 01/20/2022   History of Present Illness Nicole Cobb is a 81 y.o. female with medical history significant for hypertension, hyperlipidemia, GERD, anxiety who presents to the emergency department due to headache which started around 4 AM, headache started above right eye and extending to the back of the head, it worsens with movement and it was associated with nausea without vomiting.  She called her daughter who activated EMS, BP was checked and was normal, however, daughter was concerned due to patient having difficulty in being able to use the TV remote control and appears to be confused, she was also noted to have gait imbalance on ambulation, so it was decided for her to be taken to the ED for further evaluation and management.   Clinical Impression   Pt in bed upon therapy arrival and agreeable to participate in therapy evaluation. Patient stated multiple times that she was fine and it is just her headache that she is experiencing. She reports that she is capable of taking care of herself. During evaluation, patient did demonstrate functional mobility although presents with visual deficits that could make her a safety risk at home or if attempting to drive. See vision section below for details. I recommend HH OT at discharge to further assess her safety within the home and to determine if patient needs any low vision therapy. Encouraged patient to not drive at this time due to her vision and to have her daughter stay with her a few days once she returns home.       Recommendations for follow up therapy are one component of a multi-disciplinary discharge planning process, led by the attending physician.  Recommendations may be updated based on patient status, additional functional criteria and insurance authorization.   Follow Up Recommendations  Home health OT    Assistance  Recommended at Discharge Intermittent Supervision/Assistance  Patient can return home with the following A little help with bathing/dressing/bathroom;Assist for transportation;Direct supervision/assist for medications management;Direct supervision/assist for financial management;Assistance with cooking/housework    Functional Status Assessment  Patient has had a recent decline in their functional status and demonstrates the ability to make significant improvements in function in a reasonable and predictable amount of time.  Equipment Recommendations  None recommended by OT       Precautions / Restrictions Precautions Precautions: Other (comment) Precaution Comments: Visual impairment Restrictions Weight Bearing Restrictions: No      Mobility Bed Mobility Overal bed mobility: Modified Independent        Transfers Overall transfer level: Needs assistance   Transfers: Sit to/from Stand, Bed to chair/wheelchair/BSC Sit to Stand: Supervision Stand pivot transfers: Supervision   Step pivot transfers: Supervision     General transfer comment: When transitioning from sit to stand from bed into ambulating inside room, patient began to vear towards the left while her left hand made a grasping motion in the air as if she was looking for a bar or an object to hold onto. While in the hallway, she favored the left side of the hallway and came close to colliding with a visitor walking. Pt did not acknowledge them or indicate that they were seen.      Balance Overall balance assessment: Mild deficits observed, not formally tested         ADL either performed or assessed with clinical judgement   ADL Overall ADL's : Needs assistance/impaired Eating/Feeding: Set up;Bed level   Grooming:  Wash/dry hands;Wash/dry face;Oral care;Applying deodorant;Brushing hair;Set up;Supervision/safety;Standing;Cueing for sequencing   Upper Body Bathing: Set up;Sitting   Lower Body Bathing: Set up;Sit  to/from stand;Sitting/lateral leans   Upper Body Dressing : Set up;Sitting   Lower Body Dressing: Set up;Sit to/from stand;Sitting/lateral leans   Toilet Transfer: Supervision/safety;Regular Social worker and Hygiene: Supervision/safety;Sit to/from stand               Vision Baseline Vision/History: 1 Wears glasses Ability to See in Adequate Light: 3 Highly impaired Patient Visual Report: No change from baseline Vision Assessment?: Yes Eye Alignment: Within Functional Limits Visual Fields: Other (comment) (Unable to follow instruction to accurately test.) Depth Perception: Overshoots Additional Comments: Able to see the clock on the wall. Unable to see the clock hands to tell time. Turned her head to the right to speak to PT although when he moved to the left side of her, patient did not turn her head to follow him and instead continued to talk to her right as if he was there. Increased time was needed to locate OT or PT's finger when placed in central line of vision. Mistook the open bedroom door for the bathroom door when asked to open the bathroom door to enter.     Perception Perception Perception: Not tested   Praxis Praxis Praxis: Intact    Pertinent Vitals/Pain Pain Assessment Pain Assessment: 0-10 Pain Score: 6  Pain Location: headache - forehead region above her right eye radiating across towards the left side. Pain Descriptors / Indicators: Aching Pain Intervention(s): Monitored during session     Hand Dominance Right   Extremity/Trunk Assessment Upper Extremity Assessment Upper Extremity Assessment: Overall WFL for tasks assessed   Lower Extremity Assessment Lower Extremity Assessment: Defer to PT evaluation       Communication Communication Communication: No difficulties   Cognition Arousal/Alertness: Awake/alert Behavior During Therapy: WFL for tasks assessed/performed Overall Cognitive Status: No family/caregiver  present to determine baseline cognitive functioning           General Comments: Patient perseverated on headache pain. Repeated herself during evaluation, telling Therapists information multiple times.     General Comments  Mild balance deficits noted briefly only when patient would stand up from a seated position (ie. toilet or bed)            Home Living Family/patient expects to be discharged to:: Private residence Living Arrangements: Alone Available Help at Discharge: Family;Available PRN/intermittently Type of Home: House Home Access: Ramped entrance     Home Layout: One level     Bathroom Shower/Tub: Chief Strategy Officer: Standard     Home Equipment: Agricultural consultant (2 wheels);Cane - single point;Shower seat;BSC/3in1          Prior Functioning/Environment Prior Level of Function : Independent/Modified Independent             Mobility Comments: Pt reports that she used no device for mobility. She states that she walks around her home in the dark and reaches out for the walls out of habit. ADLs Comments: Patient reports that she drives and is able to complete all daily tasks. She uses a shower chair. She does not use a BSC, cane, or RW. 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        OT Problem List: Impaired vision/perception      OT Treatment/Interventions:      OT Goals(Current goals can be found in the care plan  section) Acute Rehab OT Goals Patient Stated Goal: to go home.  OT Frequency:      Co-evaluation PT/OT/SLP Co-Evaluation/Treatment: Yes Reason for Co-Treatment: To address functional/ADL transfers   OT goals addressed during session: ADL's and self-care      AM-PAC OT "6 Clicks" Daily Activity     Outcome Measure Help from another person eating meals?: None Help from another person taking care of personal grooming?: A Little Help from another person toileting, which includes using toliet,  bedpan, or urinal?: A Little Help from another person bathing (including washing, rinsing, drying)?: A Little Help from another person to put on and taking off regular upper body clothing?: A Little Help from another person to put on and taking off regular lower body clothing?: A Little 6 Click Score: 19   End of Session    Activity Tolerance: Patient tolerated treatment well Patient left: in bed;with call bell/phone within reach  OT Visit Diagnosis: Low vision, both eyes (H54.2);History of falling (Z91.81)                Time: 1610-9604: 0833-0854 OT Time Calculation (min): 21 min Charges:  OT General Charges $OT Visit: 1 Visit OT Evaluation $OT Eval Low Complexity: 1 Low  Limmie PatriciaLaura Jaleigha Deane, OTR/L,CBIS  [phone]   Jashan Cotten, Charisse MarchLaura D 01/20/2022, 9:57 AM

## 2022-01-20 NOTE — TOC Progression Note (Signed)
°  Transition of Care [facility]) Screening Note   Patient Details  Name: Nicole Cobb Date of Birth: 29-May-1941   Transition of Care [facility]) CM/SW Contact:    Elliot Gault, LCSW Phone Number: 01/20/2022, 9:04 AM    Transition of Care Department [facility]) has reviewed patient and no TOC needs have been identified at this time. We will continue to monitor patient advancement through interdisciplinary progression rounds. If new patient transition needs arise, please place a TOC consult.

## 2022-01-20 NOTE — Progress Notes (Signed)
PROGRESS NOTE  Nicole Cobb [id number] DOB: 1941/09/21 DOA: 01/19/2022 PCP: Redmond School, MD  Brief History:  81 year old female with a history of TIA, hypertension, hyperlipidemia, anxiety presenting with a headache that began around 4 AM on 01/19/2022.  Patient stated that she developed a frontal headache that started above her right eye and migrated across her forehead.  She also complained of an occipital headache that migrated to her neck.  She has some associated nausea.  She denied any aura.  She denied any focal extremity weakness, dysesthesias, visual disturbance, facial droop.  The patient tried taking some Tylenol and pseudophedrine and mucinex with only minimal relief.  The patient's daughter who is staying with the patient for the weekend noted that the patient was having difficulty performing tasks properly including working the television remote and using the telephone.  According to the daughter, the patient appeared to be confused with some gait instability.  At baseline, the patient is alert and oriented x4 and independent with her ADLs. In the emergency department, the patient had low-grade temperature of 99.5 F.  She was hemodynamically stable with oxygen saturation 100% room air.  Apparently there was concern about possibly a left hemianopsia per EDP.  CT of the brain was negative.  Neurology was consulted and recommended CTA of the head and neck.  CTA of the head and neck was negative for LVO.  There was no high-grade intracranial stenosis.  There is no infarct on CT perfusion.  There is no hemodynamic significant stenosis of the carotids.  WBC 12.8, hemoglobin 13.0, platelets 219,000.  Sodium 130, potassium 3.6, bicarbonate 26, serum creatinine 0.68.  Chest x-ray showed left basilar scar without any infiltrates.  UA was negative for pyuria.  The patient was admitted for further work-up and evaluation.  Assessment/Plan: Complicated migraine headache -I suspect  patient's constellation of symptoms may be related to complicated migraine -MRI brain -01/20/2022 CTA head and neck--negative LVO, no hemodynamically significant stenosis of intracranial vessels or carotids -Certainly, patient's hyponatremia may contribute to a degree of confusion  Acute metabolic encephalopathy -Secondary to complicated migraine hyponatremia -Serum [id number] -Folic acid -TSH -UA negative for pyuria -01/20/2022--patient is alert and oriented x3  Hyponatremia -Urine osmolarity -Serum osmolarity -Urine sodium -Urine creatinine  Hyperlipidemia -Continue statin  Essential hypertension -Holding atenolol and losartan for permissive hypertension  GERD -Continue pantoprazole  Anxiety -Continue home dose of alprazolam      Family Communication:   daughter updated 1/30  Consultants:  neurology  Code Status:  FULL  DVT Prophylaxis:  SCDs   Procedures: As Listed in Progress Note Above  Antibiotics: None      Subjective: Patient states that her headache is 75% better this morning.  She denies any fevers, chills, coughing, hemoptysis, chest pain, shortness of breath.  She has not had any vomiting, diarrhea, abdominal pain, dysuria, hematuria.  She denies any focal extremity deficits.  She denies any visual disturbance or dysesthesias.  Objective: Vitals:   01/20/22 0011 01/20/22 0211 01/20/22 0413 01/20/22 0620  BP: (!) 142/57 132/68 (!) 132/54 (!) 129/59  Pulse: 95 93 87 87  Resp:  16 17   Temp: 99.4 F (37.4 C) 99 F (37.2 C) 98.4 F (36.9 C) 98.7 F (37.1 C)  TempSrc: Oral Oral Oral Oral  SpO2: 100% 100%    Weight: 72.1 kg     Height: 5\' 4"  (1.626 m)       Intake/Output Summary (Last 24  hours) at 01/20/2022 0643 Last data filed at 01/20/2022 0400 Gross per 24 hour  Intake 0 ml  Output --  Net 0 ml   Weight change:  Exam:  General:  Pt is alert, follows commands appropriately, not in acute distress HEENT: No icterus, No thrush, No neck  mass, [place]/AT Cardiovascular: RRR, S1/S2, no rubs, no gallops Respiratory: CTA bilaterally, no wheezing, no crackles, no rhonchi Abdomen: Soft/+BS, non tender, non distended, no guarding Extremities: No edema, No lymphangitis, No petechiae, No rashes, no synovitis Neuro:  CN II-XII intact, strength 4/5 in RUE, RLE, strength 4/5 LUE, LLE; sensation intact bilateral; no dysmetria; babinski equivocal    Data Reviewed: I have personally reviewed following labs and imaging studies Basic Metabolic Panel: Recent Labs  Lab 01/19/22 1757 01/19/22 1806 01/20/22 0246  NA 130* 133* 132*  K 3.6 3.8 3.9  CL 97* 96* 98  CO2 26  --  21*  GLUCOSE 129* 126* 137*  BUN 17 16 15   CREATININE 0.68 0.70 0.69  CALCIUM 9.1  --  9.2  MG  --   --  2.1  PHOS  --   --  2.6   Liver Function Tests: Recent Labs  Lab 01/19/22 1757 01/20/22 0246  AST 18 22  ALT 16 17  ALKPHOS 53 50  BILITOT 0.8 0.8  PROT 7.3 7.1  ALBUMIN 4.4 4.2   No results for input(s): LIPASE, AMYLASE in the last 168 hours. No results for input(s): AMMONIA in the last 168 hours. Coagulation Profile: Recent Labs  Lab 01/19/22 1757  INR 1.0   CBC: Recent Labs  Lab 01/19/22 1757 01/19/22 1806 01/20/22 0246  WBC 10.6*  --  12.8*  NEUTROABS 7.6  --   --   HGB 12.8 13.3 13.0  HCT 37.5 39.0 38.7  MCV 89.1  --  92.8  PLT 223  --  219   Cardiac Enzymes: No results for input(s): CKTOTAL, CKMB, CKMBINDEX, TROPONINI in the last 168 hours. BNP: Invalid input(s): POCBNP CBG: No results for input(s): GLUCAP in the last 168 hours. HbA1C: No results for input(s): HGBA1C in the last 72 hours. Urine analysis:    Component Value Date/Time   COLORURINE YELLOW 01/19/2022 2131   APPEARANCEUR CLEAR 01/19/2022 2131   LABSPEC 1.010 01/19/2022 2131   PHURINE 6.5 01/19/2022 2131   GLUCOSEU NEGATIVE 01/19/2022 2131   HGBUR NEGATIVE 01/19/2022 2131   BILIRUBINUR NEGATIVE 01/19/2022 2131   KETONESUR 15 (A) 01/19/2022 2131   PROTEINUR  NEGATIVE 01/19/2022 2131   UROBILINOGEN 0.2 01/06/2015 1400   NITRITE NEGATIVE 01/19/2022 2131   LEUKOCYTESUR NEGATIVE 01/19/2022 2131   Sepsis Labs: @LABRCNTIP (procalcitonin:4,lacticidven:4) ) Recent Results (from the past 240 hour(s))  Resp Panel by RT-PCR (Flu A&B, Covid) Nasopharyngeal Swab     Status: None   Collection Time: 01/19/22  5:57 PM   Specimen: Nasopharyngeal Swab; Nasopharyngeal(NP) swabs in vial transport medium  Result Value Ref Range Status   SARS Coronavirus 2 by RT PCR NEGATIVE NEGATIVE Final    Comment: (NOTE) SARS-CoV-2 target nucleic acids are NOT DETECTED.  The SARS-CoV-2 RNA is generally detectable in upper respiratory specimens during the acute phase of infection. The lowest concentration of SARS-CoV-2 viral copies this assay can detect is 138 copies/mL. A negative result does not preclude SARS-Cov-2 infection and should not be used as the sole basis for treatment or other patient management decisions. A negative result may occur with  improper specimen collection/handling, submission of specimen other than nasopharyngeal swab, presence of viral  mutation(s) within the areas targeted by this assay, and inadequate number of viral copies(<138 copies/mL). A negative result must be combined with clinical observations, patient history, and epidemiological information. The expected result is Negative.  Fact Sheet for Patients:  EntrepreneurPulse.com.au  Fact Sheet for Healthcare Providers:  IncredibleEmployment.be  This test is no t yet approved or cleared by the [place] FDA and  has been authorized for detection and/or diagnosis of SARS-CoV-2 by FDA under an Emergency Use Authorization (EUA). This EUA will remain  in effect (meaning this test can be used) for the duration of the COVID-19 declaration under Section 564(b)(1) of the Act, 21 U.S.C.section 360bbb-3(b)(1), unless the authorization is terminated  or  revoked sooner.       Influenza A by PCR NEGATIVE NEGATIVE Final   Influenza B by PCR NEGATIVE NEGATIVE Final    Comment: (NOTE) The Xpert Xpress SARS-CoV-2/FLU/RSV plus assay is intended as an aid in the diagnosis of influenza from Nasopharyngeal swab specimens and should not be used as a sole basis for treatment. Nasal washings and aspirates are unacceptable for Xpert Xpress SARS-CoV-2/FLU/RSV testing.  Fact Sheet for Patients: EntrepreneurPulse.com.au  Fact Sheet for Healthcare Providers: IncredibleEmployment.be  This test is not yet approved or cleared by the [place] FDA and has been authorized for detection and/or diagnosis of SARS-CoV-2 by FDA under an Emergency Use Authorization (EUA). This EUA will remain in effect (meaning this test can be used) for the duration of the COVID-19 declaration under Section 564(b)(1) of the Act, 21 U.S.C. section 360bbb-3(b)(1), unless the authorization is terminated or revoked.  Performed at [facility], [address], [place], [place] [address]      Scheduled Meds:  clopidogrel  75 mg Oral Daily   pantoprazole  40 mg Oral Daily   pravastatin  20 mg Oral QPM   Continuous Infusions:  Procedures/Studies: CT HEAD WO CONTRAST  Result Date: 01/19/2022 CLINICAL DATA:  Headache. EXAM: CT HEAD WITHOUT CONTRAST TECHNIQUE: Contiguous axial images were obtained from the base of the skull through the vertex without intravenous contrast. RADIATION DOSE REDUCTION: This exam was performed according to the departmental dose-optimization program which includes automated exposure control, adjustment of the mA and/or kV according to patient size and/or use of iterative reconstruction technique. COMPARISON:  January 06, 2015. FINDINGS: Brain: No evidence of acute infarction, hemorrhage, hydrocephalus, extra-axial collection or mass lesion/mass effect. Vascular: No hyperdense vessel or unexpected calcification.  Skull: Normal. Negative for fracture or focal lesion. Sinuses/Orbits: No acute finding. Other: None. IMPRESSION: No acute intracranial abnormality seen. Electronically Signed   By: Marijo Conception M.D.   On: 01/19/2022 18:39   DG Chest Portable 1 View  Result Date: 01/19/2022 CLINICAL DATA:  Altered mental status EXAM: PORTABLE CHEST 1 VIEW COMPARISON:  07/15/2014 FINDINGS: Cardiac shadow is within normal limits. Aortic calcifications are noted. Hiatal hernia is seen. Mild scarring in the left base is noted. No bony abnormality is seen. IMPRESSION: No acute abnormality noted. Electronically Signed   By: Inez Catalina M.D.   On: 01/19/2022 23:33   CT ANGIO HEAD NECK W WO CM W PERF  Result Date: 01/19/2022 CLINICAL DATA:  Headache with encephalopathy EXAM: CT ANGIOGRAPHY HEAD AND NECK CT PERFUSION BRAIN TECHNIQUE: Multidetector CT imaging of the head and neck was performed using the standard protocol during bolus administration of intravenous contrast. Multiplanar CT image reconstructions and MIPs were obtained to evaluate the vascular anatomy. Carotid stenosis measurements (when applicable) are obtained utilizing NASCET criteria, using the  distal internal carotid diameter as the denominator. Multiphase CT imaging of the brain was performed following IV bolus contrast injection. Subsequent parametric perfusion maps were calculated using RAPID software. RADIATION DOSE REDUCTION: This exam was performed according to the departmental dose-optimization program which includes automated exposure control, adjustment of the mA and/or kV according to patient size and/or use of iterative reconstruction technique. CONTRAST:  174mL OMNIPAQUE IOHEXOL 350 MG/ML SOLN COMPARISON:  Head CT 01/19/2022 6:29 p.m. FINDINGS: CT Brain Perfusion Findings: ASPECTS: 10 at 6:29 p.m. on 01/19/2022 CBF (<30%) Volume: 12mL Perfusion (Tmax>6.0s) volume: 75mL Mismatch Volume: 5mL Infarction Location:None CTA NECK FINDINGS SKELETON: There is no  bony spinal canal stenosis. No lytic or blastic lesion. OTHER NECK: Normal pharynx, larynx and major salivary glands. No cervical lymphadenopathy. Unremarkable thyroid gland. UPPER CHEST: No pneumothorax or pleural effusion. No nodules or masses. AORTIC ARCH: There is calcific atherosclerosis of the aortic arch. There is no aneurysm, dissection or hemodynamically significant stenosis of the visualized portion of the aorta. Conventional 3 vessel aortic branching pattern. The visualized proximal subclavian arteries are widely patent. RIGHT CAROTID SYSTEM: No dissection, occlusion or aneurysm. There is calcified atherosclerosis extending into the proximal ICA, resulting in less than 50% stenosis. LEFT CAROTID SYSTEM: No dissection, occlusion or aneurysm. Mild atherosclerotic calcification at the carotid bifurcation without hemodynamically significant stenosis. VERTEBRAL ARTERIES: Codominant configuration. The origin of the left vertebral artery is narrowed. There is no dissection, occlusion or flow-limiting stenosis to the skull base (V1-V3 segments). CTA HEAD FINDINGS POSTERIOR CIRCULATION: --Vertebral arteries: Normal V4 segments. --Inferior cerebellar arteries: Normal. --Basilar artery: Normal. --Superior cerebellar arteries: Normal. --Posterior cerebral arteries (PCA): Normal. ANTERIOR CIRCULATION: --Intracranial internal carotid arteries: Atherosclerotic calcification of the internal carotid arteries at the skull base without hemodynamically significant stenosis. --Anterior cerebral arteries (ACA): Normal. Both A1 segments are present. Patent anterior communicating artery (a-comm). --Middle cerebral arteries (MCA): Normal. VENOUS SINUSES: As permitted by contrast timing, patent. ANATOMIC VARIANTS: Fetal origins of both posterior cerebral arteries. Review of the MIP images confirms the above findings. IMPRESSION: 1. No emergent large vessel occlusion or high-grade stenosis of the intracranial arteries. 2. No  infarct by CT perfusion. 3. 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 4. Narrowing of the origin of the left vertebral artery. Aortic Atherosclerosis (ICD10-I70.0). Electronically Signed   By: Ulyses Jarred M.D.   On: 01/19/2022 19:56    Orson Eva, DO  [facility]  If 7PM-7AM, please contact night-coverage [email or web address] Password [email or web address] 01/20/2022, 6:43 AM   LOS: 0 days

## 2022-01-20 NOTE — Progress Notes (Signed)
*  PRELIMINARY RESULTS* Echocardiogram 2D Echocardiogram has been performed.  Carolyne Fiscal 01/20/2022, 11:18 AM

## 2022-01-21 ENCOUNTER — Telehealth: Payer: Self-pay | Admitting: *Deleted

## 2022-01-21 DIAGNOSIS — I639 Cerebral infarction, unspecified: Secondary | ICD-10-CM

## 2022-01-21 LAB — CBC
HCT: 31.9 % — ABNORMAL LOW (ref 36.0–46.0)
Hemoglobin: 10.7 g/dL — ABNORMAL LOW (ref 12.0–15.0)
MCH: 30.6 pg (ref 26.0–34.0)
MCHC: 33.5 g/dL (ref 30.0–36.0)
MCV: 91.1 fL (ref 80.0–100.0)
Platelets: 187 10*3/uL (ref 150–400)
RBC: 3.5 MIL/uL — ABNORMAL LOW (ref 3.87–5.11)
RDW: 12.5 % (ref 11.5–15.5)
WBC: 6.7 10*3/uL (ref 4.0–10.5)
nRBC: 0 % (ref 0.0–0.2)

## 2022-01-21 LAB — BASIC METABOLIC PANEL
Anion gap: 8 (ref 5–15)
BUN: 15 mg/dL (ref 8–23)
CO2: 21 mmol/L — ABNORMAL LOW (ref 22–32)
Calcium: 8.3 mg/dL — ABNORMAL LOW (ref 8.9–10.3)
Chloride: 101 mmol/L (ref 98–111)
Creatinine, Ser: 0.61 mg/dL (ref 0.44–1.00)
GFR, Estimated: >60 mL/min (ref >60)
Glucose, Bld: 115 mg/dL — ABNORMAL HIGH (ref 70–99)
Potassium: 4 mmol/L (ref 3.5–5.1)
Sodium: 130 mmol/L — ABNORMAL LOW (ref 135–145)

## 2022-01-21 LAB — MAGNESIUM: Magnesium: 1.8 mg/dL (ref 1.7–2.4)

## 2022-01-21 MED ORDER — ASPIRIN 325 MG PO TABS: 325.0000 mg | ORAL_TABLET | Freq: Every day | ORAL | Status: DC

## 2022-01-21 MED ORDER — ATORVASTATIN CALCIUM 40 MG PO TABS: 40.0000 mg | ORAL_TABLET | Freq: Every day | ORAL | 1 refills | Status: DC

## 2022-01-21 MED ORDER — CLOPIDOGREL BISULFATE 75 MG PO TABS: 75.0000 mg | ORAL_TABLET | Freq: Every day | ORAL | 0 refills | Status: DC

## 2022-01-21 MED ORDER — D3-1000 25 MCG (1000 UT) PO TABS: 2000.0000 [IU] | ORAL_TABLET | Freq: Every day | ORAL | Status: AC

## 2022-01-21 NOTE — Telephone Encounter (Signed)
Pt enrolled in [employer]. Order placed.  

## 2022-01-21 NOTE — Plan of Care (Signed)

## 2022-01-21 NOTE — Telephone Encounter (Signed)
-----   Message from Catarina Hartshorn, MD sent at 01/21/2022 10:36 AM EST ----- Regarding: 30 day event monitor Good morning, Can you please set this patient up with 30 day event monitor for stroke? Please sent results to Dr. Shon Millet at [facility]  Thank you,  Onalee Hua

## 2022-01-21 NOTE — TOC Transition Note (Signed)
Transition of Care [facility]) - CM/SW Discharge Note   Patient Details  Name: ANNALAURA SAUSEDA MRN: [id number] Date of Birth: October 20, 1941  Transition of Care [facility]) CM/SW Contact:  Elliot Gault, LCSW Phone Number: 01/21/2022, 11:11 AM   Clinical Narrative:     Pt stable for dc. MD ordering HH Rn and OT. Spoke with pt who insists that she will return to her home at dc. She is agreeable to [facility] and states she has used [facility] in the past. Referral given to [place] at [facility]. They will see pt at home.  There are no other TOC needs for dc.  Final next level of care: Home w Home Health Services Barriers to Discharge: Barriers Resolved   Patient Goals and CMS Choice Patient states their goals for this hospitalization and ongoing recovery are:: go to [facility] home CMS Medicare.gov Compare Post Acute Care list provided to:: Patient Choice offered to / list presented to : Patient  Discharge Placement                       Discharge Plan and Services                          HH Arranged: RN, OT [facility] Agency: [facility] Date [facility] Agency Contacted: 01/21/22   Representative spoke with at [facility] Agency: Bonita Quin  Social Determinants of Health (SDOH) Interventions     Readmission Risk Interventions No flowsheet data found.

## 2022-01-21 NOTE — Discharge Summary (Addendum)
Physician Discharge Summary  Nicole Cobb [id number] DOB: [birth year] DOA: 01/19/2022  PCP: Redmond School, MD  Admit date: 01/19/2022 Discharge date: 01/21/2022  Admitted From: Home Disposition:  Home   Recommendations for Outpatient Follow-up:  Follow up with PCP in 1-2 weeks Please obtain BMP/CBC in one week   Home Health:[facility]   Discharge Condition: Stable CODE STATUS:FULL Diet recommendation: Heart Healthy    Brief/Interim Summary: 81 year old female with a history of TIA, hypertension, hyperlipidemia, anxiety presenting with a headache that began around 4 AM on 01/19/2022.  Patient stated that she developed a frontal headache that started above her right eye and migrated across her forehead.  She also complained of an occipital headache that migrated to her neck.  She has some associated nausea.  She denied any aura.  She denied any focal extremity weakness, dysesthesias, visual disturbance, facial droop.  The patient tried taking some Tylenol and pseudophedrine and mucinex with only minimal relief.  The patient's daughter who is staying with the patient for the weekend noted that the patient was having difficulty performing tasks properly including working the television remote and using the telephone.  According to the daughter, the patient appeared to be confused with some gait instability.  At baseline, the patient is alert and oriented x4 and independent with her ADLs. In the emergency department, the patient had low-grade temperature of 99.5 F.  She was hemodynamically stable with oxygen saturation 100% room air.  Apparently there was concern about possibly a left hemianopsia per EDP.  CT of the brain was negative.  Neurology was consulted and recommended CTA of the head and neck.  CTA of the head and neck was negative for LVO.  There was no high-grade intracranial stenosis.  There is no infarct on CT perfusion.  There is no hemodynamic significant stenosis of the carotids.   WBC 12.8, hemoglobin 13.0, platelets 219,000.  Sodium 130, potassium 3.6, bicarbonate 26, serum creatinine 0.68.  Chest x-ray showed left basilar scar without any infiltrates.  UA was negative for pyuria.  The patient was admitted for further work-up and evaluation.  Discharge Diagnoses:  Acute Ischemic stroke -appreciate neurology consult -MRI brain--13 mm acute cortical/subcortical infarct within the right parietal lobe (at the junction of the right MCA and PCA vascular territories). 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 and poor solute intake -repeat BMP one week after e/d   Hyperlipidemia -Continue statin -LDL 82 -intensified to lipitor 40 mg daily   Essential hypertension -Holding atenolol and losartan for permissive hypertension>>restart after discharge   GERD -Continue pantoprazole   Anxiety -Continue home dose of alprazolam  Discharge Instructions  Discharge Instructions     Ambulatory referral to Neurology   Complete by: As directed    Refer to [facility] Neurology      Allergies as of 01/21/2022   No Known Allergies      Medication List     STOP taking these medications    ibuprofen 200 MG tablet Commonly known as: ADVIL   pravastatin 20 MG tablet Commonly known as: PRAVACHOL  TAKE these medications    acetaminophen 500 MG tablet Commonly known as: TYLENOL Take 500 mg by mouth every 6 (six) hours as needed.   alendronate 70  MG tablet Commonly known as: FOSAMAX Take 70 mg by mouth once a week.   ALPRAZolam 0.5 MG tablet Commonly known as: XANAX Take 1 tablet (0.5 mg total) by mouth 2 (two) times daily as needed for sleep or anxiety. What changed: when to take this   aspirin 325 MG tablet Take 1 tablet (325 mg total) by mouth daily. Start taking on: January 22, 2022   atenolol 50 MG tablet Commonly known as: TENORMIN Take 1 tablet (50 mg total) by mouth daily.   atorvastatin 40 MG tablet Commonly known as: LIPITOR Take 1 tablet (40 mg total) by mouth daily at 6 PM.   calcium carbonate 1250 (500 Ca) MG tablet Commonly known as: OS-CAL - dosed in mg of elemental calcium Take 2 tablets by mouth daily with breakfast.   cetirizine 10 MG tablet Commonly known as: ZYRTEC Take 10 mg by mouth daily as needed for allergies.   clopidogrel 75 MG tablet Commonly known as: PLAVIX Take 1 tablet (75 mg total) by mouth daily. Start taking on: January 22, 2022   co-enzyme Q-10 50 MG capsule Take 200 mg by mouth at bedtime.   D3-1000 25 MCG (1000 UT) tablet Generic drug: Cholecalciferol Take 2 tablets (2,000 Units total) by mouth daily. What changed:  medication strength how much to take   fluticasone 50 MCG/ACT nasal spray Commonly known as: FLONASE Place 1 spray into both nostrils daily as needed.   guaiFENesin 600 MG 12 hr tablet Commonly known as: MUCINEX Take 600 mg by mouth 2 (two) times daily.   losartan 25 MG tablet Commonly known as: COZAAR Take 25 mg by mouth daily.   Lutein 20 MG Caps Take 20 mg by mouth daily.   omeprazole 20 MG capsule Commonly known as: PRILOSEC Take 20 mg by mouth daily.   SUDAFED PO Take 1 tablet by mouth daily as needed (congestion).   vitamin B-12 500 MCG tablet Commonly known as: CYANOCOBALAMIN Take by mouth.        Follow-up Information     [facility] Follow [facility].   Specialty: [place] Why: [place]  staff will call you to arrange in home visits               No Known Allergies  Consultations: neurology   Procedures/Studies: CT HEAD WO CONTRAST  Result Date: 01/19/2022 CLINICAL DATA:  Headache. EXAM: CT HEAD WITHOUT CONTRAST TECHNIQUE: Contiguous axial images were obtained from the base of the skull through the vertex without intravenous contrast. RADIATION DOSE REDUCTION: This exam was performed according to the departmental dose-optimization program which includes automated exposure control, adjustment of the mA and/or kV according to patient size and/or use of iterative reconstruction technique. COMPARISON:  January 06, 2015. FINDINGS: Brain: No evidence of acute infarction, hemorrhage, hydrocephalus, extra-axial collection or mass lesion/mass effect. Vascular: No hyperdense vessel or unexpected calcification. Skull: Normal. Negative for fracture or focal lesion. Sinuses/Orbits: No acute finding. Other: None. IMPRESSION: No acute intracranial abnormality seen. Electronically Signed   By: Marijo Conception M.D.   On: 01/19/2022 18:39   MR BRAIN WO CONTRAST  Result Date: 01/20/2022 CLINICAL DATA:  Provided history: Neuro deficit, acute, stroke suspected. EXAM: MRI HEAD WITHOUT CONTRAST TECHNIQUE: Multiplanar, multiecho pulse sequences of the brain and surrounding structures were obtained without  intravenous contrast. COMPARISON:  CT 01/19/2022. CT angiogram head/neck 01/19/2022. Brain MRI 01/25/2015. FINDINGS: Brain: Cerebral volume appears normal for age. 13 mm acute cortical/subcortical infarct within the right parietal lobe centered at the gray-white junction (for instance as seen on series 5, image 21) (series 7, image 9). Background mild multifocal T2 FLAIR hyperintense signal abnormality within the cerebral white matter, nonspecific but compatible with chronic small vessel ischemic disease. Redemonstrated small chronic infarcts within the bilateral cerebellar hemispheres (series 10,  images 4 and 2). No evidence of an intracranial mass. No chronic intracranial blood products. No extra-axial fluid collection. No midline shift. Vascular: Maintained flow voids within the proximal large arterial vessels. Skull and upper cervical spine: No focal suspicious marrow lesion. Sinuses/Orbits: Visualized orbits show no acute finding. Trace mucosal thickening within the bilateral ethmoid and right maxillary sinuses. IMPRESSION: 13 mm acute cortical/subcortical infarct within the right parietal lobe (at the junction of the right MCA and PCA vascular territories). Background mild chronic small vessel ischemic changes within the cerebral white matter. 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 Electronically Signed   By: Kellie Simmering D.O.   On: 01/20/2022 10:18   DG Chest Portable 1 View  Result Date: 01/19/2022 CLINICAL DATA:  Altered mental status EXAM: PORTABLE CHEST 1 VIEW COMPARISON:  07/15/2014 FINDINGS: Cardiac shadow is within normal limits. Aortic calcifications are noted. Hiatal hernia is seen. Mild scarring in the left base is noted. No bony abnormality is seen. IMPRESSION: No acute abnormality noted. Electronically Signed   By: Inez Catalina M.D.   On: 01/19/2022 23:33   ECHOCARDIOGRAM COMPLETE  Result Date: 01/20/2022    ECHOCARDIOGRAM REPORT   Patient Name:   CAILANI ADAMIK [facility] Date of Exam: 01/20/2022 Medical Rec #:  [id number]        Height:       64.0 in Accession #:    [id number]       Weight:       159.0 lb Date of Birth:  05/03/1941        BSA:          1.774 m Patient Age:    40 years         BP:           129/59 mmHg Patient Gender: F                HR:           74 bpm. Exam Location:  [facility] Procedure: 2D Echo, Cardiac Doppler and Color Doppler Indications:    Stroke  History:        Patient has prior history of Echocardiogram examinations, most                 recent 11/30/2015. Stroke; Risk Factors:Hypertension and                  Dyslipidemia.  Sonographer:    Wenda Low Referring Phys: [id number] OLADAPO ADEFESO IMPRESSIONS  1. Left ventricular ejection fraction, by estimation, is 60 to 65%. The left ventricle has normal function. The left ventricle has no regional wall motion abnormalities. There is mild asymmetric left ventricular hypertrophy of the basal-septal segment. Left ventricular diastolic parameters are consistent with Grade I diastolic dysfunction (impaired relaxation).  2. Right ventricular systolic function is normal. The right ventricular size is normal. There is normal pulmonary artery systolic pressure.  3. The mitral valve is grossly normal. Mild mitral valve regurgitation.  4. The aortic valve  is tricuspid. There is mild calcification of the aortic valve. There is mild thickening of the aortic valve. Aortic valve regurgitation is mild. Aortic valve sclerosis/calcification is present, without any evidence of aortic stenosis.  5. The inferior vena cava is normal in size with greater than 50% respiratory variability, suggesting right atrial pressure of 3 mmHg. Comparison(s): Compared to prior TTE in 2016, there is no significant change. AR remains mild. Conclusion(s)/Recommendation(s): No intracardiac source of embolism detected on this transthoracic study. Consider a transesophageal echocardiogram to exclude cardiac source of embolism if clinically indicated. FINDINGS  Left Ventricle: Left ventricular ejection fraction, by estimation, is 60 to 65%. The left ventricle has normal function. The left ventricle has no regional wall motion abnormalities. The left ventricular internal cavity size was normal in size. There is  mild asymmetric left ventricular hypertrophy of the basal-septal segment. Left ventricular diastolic parameters are consistent with Grade I diastolic dysfunction (impaired relaxation). Right Ventricle: The right ventricular size is normal. No increase in right ventricular wall thickness. Right ventricular  systolic function is normal. There is normal pulmonary artery systolic pressure. The tricuspid regurgitant velocity is 2.55 m/s, and  with an assumed right atrial pressure of 3 mmHg, the estimated right ventricular systolic pressure is 29.0 mmHg. Left Atrium: Left atrial size was normal in size. Right Atrium: Right atrial size was normal in size. Pericardium: Trivial pericardial effusion is present. Mitral Valve: The mitral valve is grossly normal. There is mild thickening of the mitral valve leaflet(s). There is mild calcification of the mitral valve leaflet(s). Mild mitral annular calcification. Mild mitral valve regurgitation. MV peak gradient, 5.2 mmHg. The mean mitral valve gradient is 2.0 mmHg. Tricuspid Valve: The tricuspid valve is normal in structure. Tricuspid valve regurgitation is mild. Aortic Valve: The aortic valve is tricuspid. There is mild calcification of the aortic valve. There is mild thickening of the aortic valve. Aortic valve regurgitation is mild. Aortic regurgitation PHT measures 521 msec. Aortic valve sclerosis/calcification is present, without any evidence of aortic stenosis. Aortic valve mean gradient measures 3.0 mmHg. Aortic valve peak gradient measures 7.7 mmHg. Aortic valve area, by VTI measures 2.55 cm. Pulmonic Valve: The pulmonic valve was normal in structure. Pulmonic valve regurgitation is mild. Aorta: The aortic root and ascending aorta are structurally normal, with no evidence of dilitation. Venous: The inferior vena cava is normal in size with greater than 50% respiratory variability, suggesting right atrial pressure of 3 mmHg. IAS/Shunts: No atrial level shunt detected by color flow Doppler.  LEFT VENTRICLE PLAX 2D LVIDd:         3.90 cm     Diastology LVIDs:         2.80 cm     LV e' medial:    4.79 cm/s LV PW:         1.10 cm     LV E/e' medial:  18.3 LV IVS:        1.00 cm     LV e' lateral:   6.09 cm/s LVOT diam:     2.00 cm     LV E/e' lateral: 14.4 LV SV:         80 LV  SV Index:   45 LVOT Area:     3.14 cm  LV Volumes (MOD) LV vol d, MOD A2C: 30.4 ml LV vol d, MOD A4C: 42.2 ml LV vol s, MOD A2C: 13.6 ml LV vol s, MOD A4C: 18.9 ml LV SV MOD A2C:     16.8 ml  LV SV MOD A4C:     42.2 ml LV SV MOD BP:      20.5 ml RIGHT VENTRICLE RV Basal diam:  3.65 cm RV Mid diam:    2.80 cm RV S prime:     13.40 cm/s TAPSE (M-mode): 2.2 cm LEFT ATRIUM           Index        RIGHT ATRIUM           Index LA diam:      3.40 cm 1.92 cm/m   RA Area:     15.10 cm LA Vol (A2C): 42.3 ml 23.84 ml/m  RA Volume:   37.70 ml  21.25 ml/m LA Vol (A4C): 49.7 ml 28.01 ml/m  AORTIC VALVE                    PULMONIC VALVE AV Area (Vmax):    2.44 cm     PV Vmax:       0.81 m/s AV Area (Vmean):   2.53 cm     PV Peak grad:  2.7 mmHg AV Area (VTI):     2.55 cm AV Vmax:           139.00 cm/s AV Vmean:          82.200 cm/s AV VTI:            0.315 m AV Peak Grad:      7.7 mmHg AV Mean Grad:      3.0 mmHg LVOT Vmax:         108.00 cm/s LVOT Vmean:        66.200 cm/s LVOT VTI:          0.256 m LVOT/AV VTI ratio: 0.81 AI PHT:            521 msec  AORTA Ao Root diam: 3.00 cm Ao Asc diam:  2.90 cm MITRAL VALVE                TRICUSPID VALVE MV Area (PHT): 3.31 cm     TR Peak grad:   26.0 mmHg MV Area VTI:   2.26 cm     TR Vmax:        255.00 cm/s MV Peak grad:  5.2 mmHg MV Mean grad:  2.0 mmHg     SHUNTS MV Vmax:       1.14 m/s     Systemic VTI:  0.26 m MV Vmean:      68.5 cm/s    Systemic Diam: 2.00 cm MV Decel Time: 229 msec MV E velocity: 87.80 cm/s MV A velocity: 113.00 cm/s MV E/A ratio:  0.78 Gwyndolyn Kaufman MD Electronically signed by Gwyndolyn Kaufman MD Signature Date/Time: 01/20/2022/3:56:40 PM    Final    CT ANGIO HEAD NECK W WO CM W PERF  Result Date: 01/19/2022 CLINICAL DATA:  Headache with encephalopathy EXAM: CT ANGIOGRAPHY HEAD AND NECK CT PERFUSION BRAIN TECHNIQUE: Multidetector CT imaging of the head and neck was performed using the standard protocol during bolus administration of intravenous  contrast. Multiplanar CT image reconstructions and MIPs were obtained to evaluate the vascular anatomy. Carotid stenosis measurements (when applicable) are obtained utilizing NASCET criteria, using the distal internal carotid diameter as the denominator. Multiphase CT imaging of the brain was performed following IV bolus contrast injection. Subsequent parametric perfusion maps were calculated using RAPID software. RADIATION DOSE REDUCTION: This exam was performed according to the departmental dose-optimization program which includes automated exposure  control, adjustment of the mA and/or kV according to patient size and/or use of iterative reconstruction technique. CONTRAST:  172mL OMNIPAQUE IOHEXOL 350 MG/ML SOLN COMPARISON:  Head CT 01/19/2022 6:29 p.m. FINDINGS: CT Brain Perfusion Findings: ASPECTS: 10 at 6:29 p.m. on 01/19/2022 CBF (<30%) Volume: 50mL Perfusion (Tmax>6.0s) volume: 55mL Mismatch Volume: 64mL Infarction Location:None CTA NECK FINDINGS SKELETON: There is no bony spinal canal stenosis. No lytic or blastic lesion. OTHER NECK: Normal pharynx, larynx and major salivary glands. No cervical lymphadenopathy. Unremarkable thyroid gland. UPPER CHEST: No pneumothorax or pleural effusion. No nodules or masses. AORTIC ARCH: There is calcific atherosclerosis of the aortic arch. There is no aneurysm, dissection or hemodynamically significant stenosis of the visualized portion of the aorta. Conventional 3 vessel aortic branching pattern. The visualized proximal subclavian arteries are widely patent. RIGHT CAROTID SYSTEM: No dissection, occlusion or aneurysm. There is calcified atherosclerosis extending into the proximal ICA, resulting in less than 50% stenosis. LEFT CAROTID SYSTEM: No dissection, occlusion or aneurysm. Mild atherosclerotic calcification at the carotid bifurcation without hemodynamically significant stenosis. VERTEBRAL ARTERIES: Codominant configuration. The origin of the left vertebral artery is  narrowed. There is no dissection, occlusion or flow-limiting stenosis to the skull base (V1-V3 segments). CTA HEAD FINDINGS POSTERIOR CIRCULATION: --Vertebral arteries: Normal V4 segments. --Inferior cerebellar arteries: Normal. --Basilar artery: Normal. --Superior cerebellar arteries: Normal. --Posterior cerebral arteries (PCA): Normal. ANTERIOR CIRCULATION: --Intracranial internal carotid arteries: Atherosclerotic calcification of the internal carotid arteries at the skull base without hemodynamically significant stenosis. --Anterior cerebral arteries (ACA): Normal. Both A1 segments are present. Patent anterior communicating artery (a-comm). --Middle cerebral arteries (MCA): Normal. VENOUS SINUSES: As permitted by contrast timing, patent. ANATOMIC VARIANTS: Fetal origins of both posterior cerebral arteries. Review of the MIP images confirms the above findings. IMPRESSION: 1. No emergent large vessel occlusion or high-grade stenosis of the intracranial arteries. 2. No infarct by CT perfusion. 3. 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 4. Narrowing of the origin of the left vertebral artery. Aortic Atherosclerosis (ICD10-I70.0). Electronically Signed   By: Ulyses Jarred M.D.   On: 01/19/2022 19:56        Discharge Exam: Vitals:   01/21/22 0021 01/21/22 0427  BP: (!) 130/49 (!) 164/69  Pulse: 82 77  Resp: 20 20  Temp: 97.7 F (36.5 C) 98.4 F (36.9 C)  SpO2: 97% 96%   Vitals:   01/20/22 1635 01/20/22 2040 01/21/22 0021 01/21/22 0427  BP: (!) 160/60 (!) 121/54 (!) 130/49 (!) 164/69  Pulse: 74 73 82 77  Resp: 14 17 20 20   Temp: 98.5 F (36.9 C) 98.7 F (37.1 C) 97.7 F (36.5 C) 98.4 F (36.9 C)  TempSrc: Oral Oral Oral Oral  SpO2: 100% 95% 97% 96%  Weight:      Height:        General: Pt is alert, awake, not in acute distress Cardiovascular: RRR, S1/S2 +, no rubs, no gallops Respiratory: CTA bilaterally, no wheezing, no  rhonchi Abdominal: Soft, NT, ND, bowel sounds + Extremities: no edema, no cyanosis Neuro:  CN II-XII intact, strength 4/5 in RUE, RLE, strength 4/5 LUE, LLE; sensation intact bilateral; no dysmetria; babinski equivocal    The results of significant diagnostics from this hospitalization (including imaging, microbiology, ancillary and laboratory) are listed below for reference.    Significant Diagnostic Studies: CT HEAD WO CONTRAST  Result Date: 01/19/2022 CLINICAL DATA:  Headache. EXAM: CT HEAD WITHOUT CONTRAST TECHNIQUE: Contiguous axial images were obtained from the base of the skull through the vertex  without intravenous contrast. RADIATION DOSE REDUCTION: This exam was performed according to the departmental dose-optimization program which includes automated exposure control, adjustment of the mA and/or kV according to patient size and/or use of iterative reconstruction technique. COMPARISON:  January 06, 2015. FINDINGS: Brain: No evidence of acute infarction, hemorrhage, hydrocephalus, extra-axial collection or mass lesion/mass effect. Vascular: No hyperdense vessel or unexpected calcification. Skull: Normal. Negative for fracture or focal lesion. Sinuses/Orbits: No acute finding. Other: None. IMPRESSION: No acute intracranial abnormality seen. Electronically Signed   By: Marijo Conception M.D.   On: 01/19/2022 18:39   MR BRAIN WO CONTRAST  Result Date: 01/20/2022 CLINICAL DATA:  Provided history: Neuro deficit, acute, stroke suspected. EXAM: MRI HEAD WITHOUT CONTRAST TECHNIQUE: Multiplanar, multiecho pulse sequences of the brain and surrounding structures were obtained without intravenous contrast. COMPARISON:  CT 01/19/2022. CT angiogram head/neck 01/19/2022. Brain MRI 01/25/2015. FINDINGS: Brain: Cerebral volume appears normal for age. 13 mm acute cortical/subcortical infarct within the right parietal lobe centered at the gray-white junction (for instance as seen on series 5, image 21) (series  7, image 9). Background mild multifocal T2 FLAIR hyperintense signal abnormality within the cerebral white matter, nonspecific but compatible with chronic small vessel ischemic disease. Redemonstrated small chronic infarcts within the bilateral cerebellar hemispheres (series 10, images 4 and 2). No evidence of an intracranial mass. No chronic intracranial blood products. No extra-axial fluid collection. No midline shift. Vascular: Maintained flow voids within the proximal large arterial vessels. Skull and upper cervical spine: No focal suspicious marrow lesion. Sinuses/Orbits: Visualized orbits show no acute finding. Trace mucosal thickening within the bilateral ethmoid and right maxillary sinuses. IMPRESSION: 13 mm acute cortical/subcortical infarct within the right parietal lobe (at the junction of the right MCA and PCA vascular territories). Background mild chronic small vessel ischemic changes within the cerebral white matter. 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 Electronically Signed   By: Kellie Simmering D.O.   On: 01/20/2022 10:18   DG Chest Portable 1 View  Result Date: 01/19/2022 CLINICAL DATA:  Altered mental status EXAM: PORTABLE CHEST 1 VIEW COMPARISON:  07/15/2014 FINDINGS: Cardiac shadow is within normal limits. Aortic calcifications are noted. Hiatal hernia is seen. Mild scarring in the left base is noted. No bony abnormality is seen. IMPRESSION: No acute abnormality noted. Electronically Signed   By: Inez Catalina M.D.   On: 01/19/2022 23:33   ECHOCARDIOGRAM COMPLETE  Result Date: 01/20/2022    ECHOCARDIOGRAM REPORT   Patient Name:   Nicole Cobb [facility] Date of Exam: 01/20/2022 Medical Rec #:  [id number]        Height:       64.0 in Accession #:    [id number]       Weight:       159.0 lb Date of Birth:  September 11, 1941        BSA:          1.774 m Patient Age:    74 years         BP:           129/59 mmHg Patient Gender: F                HR:           74 bpm.  Exam Location:  [facility] Procedure: 2D Echo, Cardiac Doppler and Color Doppler Indications:    Stroke  History:        Patient has prior history of Echocardiogram examinations, most  recent 11/30/2015. Stroke; Risk Factors:Hypertension and                 Dyslipidemia.  Sonographer:    Wenda Low Referring Phys: [id number] OLADAPO ADEFESO IMPRESSIONS  1. Left ventricular ejection fraction, by estimation, is 60 to 65%. The left ventricle has normal function. The left ventricle has no regional wall motion abnormalities. There is mild asymmetric left ventricular hypertrophy of the basal-septal segment. Left ventricular diastolic parameters are consistent with Grade I diastolic dysfunction (impaired relaxation).  2. Right ventricular systolic function is normal. The right ventricular size is normal. There is normal pulmonary artery systolic pressure.  3. The mitral valve is grossly normal. Mild mitral valve regurgitation.  4. The aortic valve is tricuspid. There is mild calcification of the aortic valve. There is mild thickening of the aortic valve. Aortic valve regurgitation is mild. Aortic valve sclerosis/calcification is present, without any evidence of aortic stenosis.  5. The inferior vena cava is normal in size with greater than 50% respiratory variability, suggesting right atrial pressure of 3 mmHg. Comparison(s): Compared to prior TTE in 2016, there is no significant change. AR remains mild. Conclusion(s)/Recommendation(s): No intracardiac source of embolism detected on this transthoracic study. Consider a transesophageal echocardiogram to exclude cardiac source of embolism if clinically indicated. FINDINGS  Left Ventricle: Left ventricular ejection fraction, by estimation, is 60 to 65%. The left ventricle has normal function. The left ventricle has no regional wall motion abnormalities. The left ventricular internal cavity size was normal in size. There is  mild asymmetric left ventricular  hypertrophy of the basal-septal segment. Left ventricular diastolic parameters are consistent with Grade I diastolic dysfunction (impaired relaxation). Right Ventricle: The right ventricular size is normal. No increase in right ventricular wall thickness. Right ventricular systolic function is normal. There is normal pulmonary artery systolic pressure. The tricuspid regurgitant velocity is 2.55 m/s, and  with an assumed right atrial pressure of 3 mmHg, the estimated right ventricular systolic pressure is 0000000 mmHg. Left Atrium: Left atrial size was normal in size. Right Atrium: Right atrial size was normal in size. Pericardium: Trivial pericardial effusion is present. Mitral Valve: The mitral valve is grossly normal. There is mild thickening of the mitral valve leaflet(s). There is mild calcification of the mitral valve leaflet(s). Mild mitral annular calcification. Mild mitral valve regurgitation. MV peak gradient, 5.2 mmHg. The mean mitral valve gradient is 2.0 mmHg. Tricuspid Valve: The tricuspid valve is normal in structure. Tricuspid valve regurgitation is mild. Aortic Valve: The aortic valve is tricuspid. There is mild calcification of the aortic valve. There is mild thickening of the aortic valve. Aortic valve regurgitation is mild. Aortic regurgitation PHT measures 521 msec. Aortic valve sclerosis/calcification is present, without any evidence of aortic stenosis. Aortic valve mean gradient measures 3.0 mmHg. Aortic valve peak gradient measures 7.7 mmHg. Aortic valve area, by VTI measures 2.55 cm. Pulmonic Valve: The pulmonic valve was normal in structure. Pulmonic valve regurgitation is mild. Aorta: The aortic root and ascending aorta are structurally normal, with no evidence of dilitation. Venous: The inferior vena cava is normal in size with greater than 50% respiratory variability, suggesting right atrial pressure of 3 mmHg. IAS/Shunts: No atrial level shunt detected by color flow Doppler.  LEFT  VENTRICLE PLAX 2D LVIDd:         3.90 cm     Diastology LVIDs:         2.80 cm     LV e' medial:    4.79 cm/s LV  PW:         1.10 cm     LV E/e' medial:  18.3 LV IVS:        1.00 cm     LV e' lateral:   6.09 cm/s LVOT diam:     2.00 cm     LV E/e' lateral: 14.4 LV SV:         80 LV SV Index:   45 LVOT Area:     3.14 cm  LV Volumes (MOD) LV vol d, MOD A2C: 30.4 ml LV vol d, MOD A4C: 42.2 ml LV vol s, MOD A2C: 13.6 ml LV vol s, MOD A4C: 18.9 ml LV SV MOD A2C:     16.8 ml LV SV MOD A4C:     42.2 ml LV SV MOD BP:      20.5 ml RIGHT VENTRICLE RV Basal diam:  3.65 cm RV Mid diam:    2.80 cm RV S prime:     13.40 cm/s TAPSE (M-mode): 2.2 cm LEFT ATRIUM           Index        RIGHT ATRIUM           Index LA diam:      3.40 cm 1.92 cm/m   RA Area:     15.10 cm LA Vol (A2C): 42.3 ml 23.84 ml/m  RA Volume:   37.70 ml  21.25 ml/m LA Vol (A4C): 49.7 ml 28.01 ml/m  AORTIC VALVE                    PULMONIC VALVE AV Area (Vmax):    2.44 cm     PV Vmax:       0.81 m/s AV Area (Vmean):   2.53 cm     PV Peak grad:  2.7 mmHg AV Area (VTI):     2.55 cm AV Vmax:           139.00 cm/s AV Vmean:          82.200 cm/s AV VTI:            0.315 m AV Peak Grad:      7.7 mmHg AV Mean Grad:      3.0 mmHg LVOT Vmax:         108.00 cm/s LVOT Vmean:        66.200 cm/s LVOT VTI:          0.256 m LVOT/AV VTI ratio: 0.81 AI PHT:            521 msec  AORTA Ao Root diam: 3.00 cm Ao Asc diam:  2.90 cm MITRAL VALVE                TRICUSPID VALVE MV Area (PHT): 3.31 cm     TR Peak grad:   26.0 mmHg MV Area VTI:   2.26 cm     TR Vmax:        255.00 cm/s MV Peak grad:  5.2 mmHg MV Mean grad:  2.0 mmHg     SHUNTS MV Vmax:       1.14 m/s     Systemic VTI:  0.26 m MV Vmean:      68.5 cm/s    Systemic Diam: 2.00 cm MV Decel Time: 229 msec MV E velocity: 87.80 cm/s MV A velocity: 113.00 cm/s MV E/A ratio:  0.78 Gwyndolyn Kaufman MD Electronically signed by Gwyndolyn Kaufman MD Signature Date/Time: 01/20/2022/3:56:40 PM  Final    CT ANGIO HEAD NECK W  WO CM W PERF  Result Date: 01/19/2022 CLINICAL DATA:  Headache with encephalopathy EXAM: CT ANGIOGRAPHY HEAD AND NECK CT PERFUSION BRAIN TECHNIQUE: Multidetector CT imaging of the head and neck was performed using the standard protocol during bolus administration of intravenous contrast. Multiplanar CT image reconstructions and MIPs were obtained to evaluate the vascular anatomy. Carotid stenosis measurements (when applicable) are obtained utilizing NASCET criteria, using the distal internal carotid diameter as the denominator. Multiphase CT imaging of the brain was performed following IV bolus contrast injection. Subsequent parametric perfusion maps were calculated using RAPID software. RADIATION DOSE REDUCTION: This exam was performed according to the departmental dose-optimization program which includes automated exposure control, adjustment of the mA and/or kV according to patient size and/or use of iterative reconstruction technique. CONTRAST:  128mL OMNIPAQUE IOHEXOL 350 MG/ML SOLN COMPARISON:  Head CT 01/19/2022 6:29 p.m. FINDINGS: CT Brain Perfusion Findings: ASPECTS: 10 at 6:29 p.m. on 01/19/2022 CBF (<30%) Volume: 73mL Perfusion (Tmax>6.0s) volume: 74mL Mismatch Volume: 44mL Infarction Location:None CTA NECK FINDINGS SKELETON: There is no bony spinal canal stenosis. No lytic or blastic lesion. OTHER NECK: Normal pharynx, larynx and major salivary glands. No cervical lymphadenopathy. Unremarkable thyroid gland. UPPER CHEST: No pneumothorax or pleural effusion. No nodules or masses. AORTIC ARCH: There is calcific atherosclerosis of the aortic arch. There is no aneurysm, dissection or hemodynamically significant stenosis of the visualized portion of the aorta. Conventional 3 vessel aortic branching pattern. The visualized proximal subclavian arteries are widely patent. RIGHT CAROTID SYSTEM: No dissection, occlusion or aneurysm. There is calcified atherosclerosis extending into the proximal ICA, resulting in  less than 50% stenosis. LEFT CAROTID SYSTEM: No dissection, occlusion or aneurysm. Mild atherosclerotic calcification at the carotid bifurcation without hemodynamically significant stenosis. VERTEBRAL ARTERIES: Codominant configuration. The origin of the left vertebral artery is narrowed. There is no dissection, occlusion or flow-limiting stenosis to the skull base (V1-V3 segments). CTA HEAD FINDINGS POSTERIOR CIRCULATION: --Vertebral arteries: Normal V4 segments. --Inferior cerebellar arteries: Normal. --Basilar artery: Normal. --Superior cerebellar arteries: Normal. --Posterior cerebral arteries (PCA): Normal. ANTERIOR CIRCULATION: --Intracranial internal carotid arteries: Atherosclerotic calcification of the internal carotid arteries at the skull base without hemodynamically significant stenosis. --Anterior cerebral arteries (ACA): Normal. Both A1 segments are present. Patent anterior communicating artery (a-comm). --Middle cerebral arteries (MCA): Normal. VENOUS SINUSES: As permitted by contrast timing, patent. ANATOMIC VARIANTS: Fetal origins of both posterior cerebral arteries. Review of the MIP images confirms the above findings. IMPRESSION: 1. No emergent large vessel occlusion or high-grade stenosis of the intracranial arteries. 2. No infarct by CT perfusion. 3. 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 4. Narrowing of the origin of the left vertebral artery. Aortic Atherosclerosis (ICD10-I70.0). Electronically Signed   By: Ulyses Jarred M.D.   On: 01/19/2022 19:56    Microbiology: Recent Results (from the past 240 hour(s))  Resp Panel by RT-PCR (Flu A&B, Covid) Nasopharyngeal Swab     Status: None   Collection Time: 01/19/22  5:57 PM   Specimen: Nasopharyngeal Swab; Nasopharyngeal(NP) swabs in vial transport medium  Result Value Ref Range Status   SARS Coronavirus 2 by RT PCR NEGATIVE NEGATIVE Final    Comment: (NOTE) SARS-CoV-2 target nucleic  acids are NOT DETECTED.  The SARS-CoV-2 RNA is generally detectable in upper respiratory specimens during the acute phase of infection. The lowest concentration of SARS-CoV-2 viral copies this assay can detect is 138 copies/mL. A negative result does not preclude SARS-Cov-2 infection and should  not be used as the sole basis for treatment or other patient management decisions. A negative result may occur with  improper specimen collection/handling, submission of specimen other than nasopharyngeal swab, presence of viral mutation(s) within the areas targeted by this assay, and inadequate number of viral copies(<138 copies/mL). A negative result must be combined with clinical observations, patient history, and epidemiological information. The expected result is Negative.  Fact Sheet for Patients:  EntrepreneurPulse.com.au  Fact Sheet for Healthcare Providers:  IncredibleEmployment.be  This test is no t yet approved or cleared by the [place] FDA and  has been authorized for detection and/or diagnosis of SARS-CoV-2 by FDA under an Emergency Use Authorization (EUA). This EUA will remain  in effect (meaning this test can be used) for the duration of the COVID-19 declaration under Section 564(b)(1) of the Act, 21 U.S.C.section 360bbb-3(b)(1), unless the authorization is terminated  or revoked sooner.       Influenza A by PCR NEGATIVE NEGATIVE Final   Influenza B by PCR NEGATIVE NEGATIVE Final    Comment: (NOTE) The Xpert Xpress SARS-CoV-2/FLU/RSV plus assay is intended as an aid in the diagnosis of influenza from Nasopharyngeal swab specimens and should not be used as a sole basis for treatment. Nasal washings and aspirates are unacceptable for Xpert Xpress SARS-CoV-2/FLU/RSV testing.  Fact Sheet for Patients: EntrepreneurPulse.com.au  Fact Sheet for Healthcare Providers: IncredibleEmployment.be  This  test is not yet approved or cleared by the [place] FDA and has been authorized for detection and/or diagnosis of SARS-CoV-2 by FDA under an Emergency Use Authorization (EUA). This EUA will remain in effect (meaning this test can be used) for the duration of the COVID-19 declaration under Section 564(b)(1) of the Act, 21 U.S.C. section 360bbb-3(b)(1), unless the authorization is terminated or revoked.  Performed at [facility], [address], [place], [place] [address]      Labs: Basic Metabolic Panel: Recent Labs  Lab 01/19/22 1757 01/19/22 1806 01/20/22 0246 01/21/22 0454  NA 130* 133* 132* 130*  K 3.6 3.8 3.9 4.0  CL 97* 96* 98 101  CO2 26  --  21* 21*  GLUCOSE 129* 126* 137* 115*  BUN 17 16 15 15   CREATININE 0.68 0.70 0.69 0.61  CALCIUM 9.1  --  9.2 8.3*  MG  --   --  2.1 1.8  PHOS  --   --  2.6  --    Liver Function Tests: Recent Labs  Lab 01/19/22 1757 01/20/22 0246  AST 18 22  ALT 16 17  ALKPHOS 53 50  BILITOT 0.8 0.8  PROT 7.3 7.1  ALBUMIN 4.4 4.2   No results for input(s): LIPASE, AMYLASE in the last 168 hours. No results for input(s): AMMONIA in the last 168 hours. CBC: Recent Labs  Lab 01/19/22 1757 01/19/22 1806 01/20/22 0246 01/21/22 0454  WBC 10.6*  --  12.8* 6.7  NEUTROABS 7.6  --   --   --   HGB 12.8 13.3 13.0 10.7*  HCT 37.5 39.0 38.7 31.9*  MCV 89.1  --  92.8 91.1  PLT 223  --  219 187   Cardiac Enzymes: No results for input(s): CKTOTAL, CKMB, CKMBINDEX, TROPONINI in the last 168 hours. BNP: Invalid input(s): POCBNP CBG: No results for input(s): GLUCAP in the last 168 hours.  Time coordinating discharge:  36 minutes  Signed:  Orson Eva, DO [facility] Pager: [phone] 01/21/2022, 1:46 PM

## 2022-01-21 NOTE — Progress Notes (Signed)
Patient tearful and anxious , given emotional support by this Clinical research associate , patient redirected as she says " I feel helpless" , reminded patient her progress is going to take time and that she is doing much better and that therapy will also make her stronger. Gave her icecream since patient did not eat her breakfast, she was given PRN zofran .

## 2022-01-22 NOTE — Progress Notes (Signed)
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 Hypertension Hyperlipidemia Occipital neuralgia  1  30 day cardiac event monitor 2  Continue ASA and Plavix 75mg  daily for rest of 21 days, followed by Plavix 75mg  daily alone. 3   For occipital neuralgia and depression/anxiety, start duloxetine 30mg  daily 4  Home Health assessment already ordered.  Will need OT.   5  Secondary stroke prevention management as per PCP:  - atorvastatin 40mg  daily.  LDL goal less than 70  - Normotensive blood pressure  - Hgb A1c goal less than 7 6  Mediterranean diet 7  Routine exercise 8  Follow up 6 months.  Total time spent with patient and reviewing chart:  65 minutes  Subjective:  Nicole Cobb is a 81 year old right-handed female with HTN, HLD and history of BPPV who presents for stroke.  History supplemented by hospital notes.  CT head, MRI of brain and CTA head and neck personally reviewed.  She is accompanied by her daughter.  Severe right frontal and bilateral occipital headache. Confusion and visual problems trouble operating phone and TV remote -  Patient presented to [facility] on 01/19/2022 for posterior and right frontal headache with confusion and difficulty following commands.  She did not exhibit any lateralizing symptoms.  On exam, there was concern for left-sided hemianopsia.  CT head showed no acute abnormalities but MRI of brain demonstrated 13 mm acute cortical/subcortical infarct within the right parietal lobe at junction of right MCA and PCA vascular territories.  Did not receive tPA due to outside  therapeutic window.  CTA of head and neck was negative for LVO or hemodynamically significant stenosis.  2D echocardiogram showed EF 60-65% with no PFO or other cardiac source of embolus.  LDL was 82 and Hgb A1c was 5.4.  Her pravastatin was changed to atorvastatin.  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  Atorvastatin was increased.  Prior to admission, she was taking Plavix.  She was discharged on ASA 325mg  and Plavix 75mg  daily for 21 days followed by ASA 325mg  daily alone.  Her daughter has been staying with her 24/7.  She is using a walker, which is not new.  She has not had any falls since April of last year.  Since she has been home, she endorses severe paroxysmal shooting pain in the back of her head arising from the right occipital region and radiating up and to the mid occipital region.  Scalp tender to touch.  No neck pain.  Longstanding history of anxiety.  More tearful since the stroke.  01/20/2022 MRI BRAIN WO:  13 mm acute cortical/subcortical infarct within the right parietal lobe (at the junction of the right MCA and PCA vascular territories). Background mild chronic small vessel ischemic changes within the cerebral white matter. 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 01/19/2022 CTA HEAD & NECK:  No emergent large vessel occlusion or high-grade stenosis of the intracranial arteries. No infarct by CT perfusion.Bilateral carotid bifurcation atherosclerosis without hemodynamically significant stenosis. Narrowing of the origin of the left vertebral artery.  PAST MEDICAL HISTORY: Past Medical History:  Diagnosis Date   Anemia    Anxiety    BPV (benign positional vertigo)    Hiatal hernia    HTN (hypertension)    Hyperlipidemia    Hypokalemia    Vertigo     PAST SURGICAL HISTORY: Past Surgical History:  Procedure Laterality Date   TUBAL LIGATION       MEDICATIONS: Current Outpatient Medications on File Prior to Visit  Medication Sig Dispense Refill   acetaminophen (TYLENOL) 500 MG tablet Take 500 mg by mouth every 6 (six) hours as needed.     alendronate (FOSAMAX) 70 MG tablet Take 70 mg by mouth once a week.     ALPRAZolam (XANAX) 0.5 MG tablet Take 1 tablet (0.5 mg total) by mouth 2 (two) times daily as needed for sleep or anxiety. (Patient taking differently: Take 0.5 mg by mouth 2 (two) times daily.) 30 tablet 0   aspirin 325 MG tablet Take 1 tablet (325 mg total) by mouth daily.     atenolol (TENORMIN) 50 MG tablet Take 1 tablet (50 mg total) by mouth daily. 30 tablet 3   atorvastatin (LIPITOR) 40 MG tablet Take 1 tablet (40 mg total) by mouth daily at 6 PM. 30 tablet 1   calcium carbonate (OS-CAL - DOSED IN MG OF ELEMENTAL CALCIUM) 1250 (500 Ca) MG tablet Take 2 tablets by mouth daily with breakfast.     cetirizine (ZYRTEC) 10 MG tablet Take 10 mg by mouth daily as needed for allergies.      Cholecalciferol (D3-1000) 25 MCG (1000 UT) tablet Take 2 tablets (2,000 Units total) by mouth daily.     clopidogrel (PLAVIX) 75 MG tablet Take 1 tablet (75 mg total) by mouth daily. 20 tablet 0   co-enzyme Q-10 50 MG capsule Take 200 mg by mouth at bedtime.      fluticasone (FLONASE) 50 MCG/ACT nasal spray Place 1 spray into both nostrils daily as needed.      guaiFENesin (MUCINEX) 600 MG 12 hr tablet Take 600 mg by mouth 2 (two) times daily.     losartan (COZAAR) 25 MG tablet Take 25 mg by mouth daily.     Lutein 20 MG CAPS Take 20 mg by mouth daily.     omeprazole (PRILOSEC) 20 MG capsule Take 20 mg by mouth daily.     Pseudoephedrine HCl (SUDAFED PO) Take 1 tablet by mouth daily as needed (congestion).     vitamin B-12 (CYANOCOBALAMIN) 500 MCG tablet Take by mouth.     No current facility-administered medications on file prior to visit.    ALLERGIES: No Known Allergies  FAMILY HISTORY: Family History  Problem Relation Age of Onset    Diabetes Mother    Cancer Mother        breast    Heart failure Mother    Cancer Father        lymphoma   Heart failure Brother    Heart attack Brother    Cancer Brother        stomach    Objective:  Blood pressure (!) 161/71, pulse 79, height 5\' 4"  (1.626 m), weight 162 lb (73.5 kg), SpO2 97 %. General: No acute distress.  Patient appears well-groomed.   Head:  Normocephalic/atraumatic Eyes:  fundi examined but not visualized Neck: supple, no paraspinal tenderness, full range of motion Back: No paraspinal tenderness Heart: regular rate and rhythm Lungs: Clear to auscultation bilaterally. Vascular: No carotid bruits. Neurological Exam: Mental status: alert and oriented to person, place, and  time, speech fluent and not dysarthric, language intact.  Left-sided hemineglect. Cranial nerves: CN I: not tested CN II: pupils equal, round and reactive to light, left sided field cut CN III, IV, VI:  full range of motion, no nystagmus, no ptosis CN V: facial sensation intact. CN VII: upper and lower face symmetric CN VIII: hearing intact CN IX, X: gag intact, uvula midline CN XI: sternocleidomastoid and trapezius muscles intact CN XII: tongue midline Bulk & Tone: slightly increased in left upper extremity; no fasciculations. Motor:  muscle strength 5/5 throughout, exhibited mild left upper extremity weakness that normalized with encouragement. Sensation:  Pinprick sensation reduced in left upper and lower extremities, vibratory sensation intact. Deep Tendon Reflexes:  3+ throughout (left greater than right), left Babinski, first toe on right foot extended (chronic) Finger to nose testing:  Without dysmetria.   Gait:  Broad-based unsteady gait.  Requires use of walker.  Romberg with sway.    Thank you for allowing me to take part in the care of this patient.  Shon Millet, DO  CC: Elfredia Nevins, MD

## 2022-01-23 ENCOUNTER — Encounter: Payer: Self-pay | Admitting: Neurology

## 2022-01-23 ENCOUNTER — Ambulatory Visit: Payer: Medicare Other | Admitting: Neurology

## 2022-01-23 ENCOUNTER — Other Ambulatory Visit: Payer: Self-pay

## 2022-01-23 VITALS — BP 161/71 | HR 79 | Ht 64.0 in | Wt 162.0 lb

## 2022-01-23 DIAGNOSIS — F419 Anxiety disorder, unspecified: Secondary | ICD-10-CM

## 2022-01-23 DIAGNOSIS — K219 Gastro-esophageal reflux disease without esophagitis: Secondary | ICD-10-CM | POA: Diagnosis not present

## 2022-01-23 DIAGNOSIS — D649 Anemia, unspecified: Secondary | ICD-10-CM | POA: Diagnosis not present

## 2022-01-23 DIAGNOSIS — E876 Hypokalemia: Secondary | ICD-10-CM | POA: Diagnosis not present

## 2022-01-23 DIAGNOSIS — M5481 Occipital neuralgia: Secondary | ICD-10-CM | POA: Diagnosis not present

## 2022-01-23 DIAGNOSIS — H8110 Benign paroxysmal vertigo, unspecified ear: Secondary | ICD-10-CM | POA: Diagnosis not present

## 2022-01-23 DIAGNOSIS — R414 Neurologic neglect syndrome: Secondary | ICD-10-CM | POA: Diagnosis not present

## 2022-01-23 DIAGNOSIS — H547 Unspecified visual loss: Secondary | ICD-10-CM | POA: Diagnosis not present

## 2022-01-23 DIAGNOSIS — G43909 Migraine, unspecified, not intractable, without status migrainosus: Secondary | ICD-10-CM | POA: Diagnosis not present

## 2022-01-23 DIAGNOSIS — Z7902 Long term (current) use of antithrombotics/antiplatelets: Secondary | ICD-10-CM | POA: Diagnosis not present

## 2022-01-23 DIAGNOSIS — Z9181 History of falling: Secondary | ICD-10-CM | POA: Diagnosis not present

## 2022-01-23 DIAGNOSIS — F32A Depression, unspecified: Secondary | ICD-10-CM | POA: Diagnosis not present

## 2022-01-23 DIAGNOSIS — Z7982 Long term (current) use of aspirin: Secondary | ICD-10-CM | POA: Diagnosis not present

## 2022-01-23 DIAGNOSIS — E785 Hyperlipidemia, unspecified: Secondary | ICD-10-CM

## 2022-01-23 DIAGNOSIS — I63431 Cerebral infarction due to embolism of right posterior cerebral artery: Secondary | ICD-10-CM

## 2022-01-23 DIAGNOSIS — E871 Hypo-osmolality and hyponatremia: Secondary | ICD-10-CM | POA: Diagnosis not present

## 2022-01-23 DIAGNOSIS — Z7983 Long term (current) use of bisphosphonates: Secondary | ICD-10-CM | POA: Diagnosis not present

## 2022-01-23 DIAGNOSIS — I69398 Other sequelae of cerebral infarction: Secondary | ICD-10-CM | POA: Diagnosis not present

## 2022-01-23 DIAGNOSIS — I10 Essential (primary) hypertension: Secondary | ICD-10-CM

## 2022-01-23 DIAGNOSIS — G9341 Metabolic encephalopathy: Secondary | ICD-10-CM | POA: Diagnosis not present

## 2022-01-23 MED ORDER — DULOXETINE HCL 30 MG PO CPEP: 30.0000 mg | ORAL_CAPSULE | Freq: Every day | ORAL | 5 refills | Status: DC

## 2022-01-23 NOTE — Patient Instructions (Addendum)
Once you finish 21 days of aspirin and Plavix, may discontinue aspirin and continue Plavix 75mg  daily For the head pain and anxiety/depression, start duloxetine 30mg  daily Found out what home health says - have them let me know if they have any other recommendations such as physical therapy or more supervision Set up for 30 day cardiac event monitor Continue atorvastatin and blood pressure medication Follow up in 4 months.

## 2022-01-25 ENCOUNTER — Ambulatory Visit (INDEPENDENT_AMBULATORY_CARE_PROVIDER_SITE_OTHER): Payer: Medicare Other

## 2022-01-25 DIAGNOSIS — I639 Cerebral infarction, unspecified: Secondary | ICD-10-CM

## 2022-01-25 DIAGNOSIS — G464 Cerebellar stroke syndrome: Secondary | ICD-10-CM | POA: Diagnosis not present

## 2022-01-27 DIAGNOSIS — H8110 Benign paroxysmal vertigo, unspecified ear: Secondary | ICD-10-CM | POA: Diagnosis not present

## 2022-01-27 DIAGNOSIS — G9341 Metabolic encephalopathy: Secondary | ICD-10-CM | POA: Diagnosis not present

## 2022-01-27 DIAGNOSIS — G43909 Migraine, unspecified, not intractable, without status migrainosus: Secondary | ICD-10-CM | POA: Diagnosis not present

## 2022-01-27 DIAGNOSIS — D649 Anemia, unspecified: Secondary | ICD-10-CM | POA: Diagnosis not present

## 2022-01-27 DIAGNOSIS — I10 Essential (primary) hypertension: Secondary | ICD-10-CM | POA: Diagnosis not present

## 2022-01-27 DIAGNOSIS — Z9181 History of falling: Secondary | ICD-10-CM | POA: Diagnosis not present

## 2022-01-27 DIAGNOSIS — Z7983 Long term (current) use of bisphosphonates: Secondary | ICD-10-CM | POA: Diagnosis not present

## 2022-01-27 DIAGNOSIS — E785 Hyperlipidemia, unspecified: Secondary | ICD-10-CM | POA: Diagnosis not present

## 2022-01-27 DIAGNOSIS — E871 Hypo-osmolality and hyponatremia: Secondary | ICD-10-CM | POA: Diagnosis not present

## 2022-01-27 DIAGNOSIS — E876 Hypokalemia: Secondary | ICD-10-CM | POA: Diagnosis not present

## 2022-01-27 DIAGNOSIS — Z7982 Long term (current) use of aspirin: Secondary | ICD-10-CM | POA: Diagnosis not present

## 2022-01-27 DIAGNOSIS — Z7902 Long term (current) use of antithrombotics/antiplatelets: Secondary | ICD-10-CM | POA: Diagnosis not present

## 2022-01-27 DIAGNOSIS — K219 Gastro-esophageal reflux disease without esophagitis: Secondary | ICD-10-CM | POA: Diagnosis not present

## 2022-01-27 DIAGNOSIS — F32A Depression, unspecified: Secondary | ICD-10-CM | POA: Diagnosis not present

## 2022-01-27 DIAGNOSIS — H547 Unspecified visual loss: Secondary | ICD-10-CM | POA: Diagnosis not present

## 2022-01-27 DIAGNOSIS — I69398 Other sequelae of cerebral infarction: Secondary | ICD-10-CM | POA: Diagnosis not present

## 2022-01-28 ENCOUNTER — Other Ambulatory Visit: Payer: Self-pay

## 2022-01-28 DIAGNOSIS — I69398 Other sequelae of cerebral infarction: Secondary | ICD-10-CM | POA: Diagnosis not present

## 2022-01-28 DIAGNOSIS — Z9181 History of falling: Secondary | ICD-10-CM | POA: Diagnosis not present

## 2022-01-28 DIAGNOSIS — D649 Anemia, unspecified: Secondary | ICD-10-CM | POA: Diagnosis not present

## 2022-01-28 DIAGNOSIS — E876 Hypokalemia: Secondary | ICD-10-CM | POA: Diagnosis not present

## 2022-01-28 DIAGNOSIS — H8110 Benign paroxysmal vertigo, unspecified ear: Secondary | ICD-10-CM | POA: Diagnosis not present

## 2022-01-28 DIAGNOSIS — Z7983 Long term (current) use of bisphosphonates: Secondary | ICD-10-CM | POA: Diagnosis not present

## 2022-01-28 DIAGNOSIS — H547 Unspecified visual loss: Secondary | ICD-10-CM | POA: Diagnosis not present

## 2022-01-28 DIAGNOSIS — G9341 Metabolic encephalopathy: Secondary | ICD-10-CM | POA: Diagnosis not present

## 2022-01-28 DIAGNOSIS — Z7902 Long term (current) use of antithrombotics/antiplatelets: Secondary | ICD-10-CM | POA: Diagnosis not present

## 2022-01-28 DIAGNOSIS — F32A Depression, unspecified: Secondary | ICD-10-CM | POA: Diagnosis not present

## 2022-01-28 DIAGNOSIS — E785 Hyperlipidemia, unspecified: Secondary | ICD-10-CM | POA: Diagnosis not present

## 2022-01-28 DIAGNOSIS — Z7982 Long term (current) use of aspirin: Secondary | ICD-10-CM | POA: Diagnosis not present

## 2022-01-28 DIAGNOSIS — G43909 Migraine, unspecified, not intractable, without status migrainosus: Secondary | ICD-10-CM | POA: Diagnosis not present

## 2022-01-28 DIAGNOSIS — K219 Gastro-esophageal reflux disease without esophagitis: Secondary | ICD-10-CM | POA: Diagnosis not present

## 2022-01-28 DIAGNOSIS — E871 Hypo-osmolality and hyponatremia: Secondary | ICD-10-CM | POA: Diagnosis not present

## 2022-01-28 DIAGNOSIS — I10 Essential (primary) hypertension: Secondary | ICD-10-CM | POA: Diagnosis not present

## 2022-01-29 DIAGNOSIS — G9341 Metabolic encephalopathy: Secondary | ICD-10-CM | POA: Diagnosis not present

## 2022-01-29 DIAGNOSIS — I10 Essential (primary) hypertension: Secondary | ICD-10-CM | POA: Diagnosis not present

## 2022-01-29 DIAGNOSIS — I639 Cerebral infarction, unspecified: Secondary | ICD-10-CM | POA: Diagnosis not present

## 2022-01-30 DIAGNOSIS — K219 Gastro-esophageal reflux disease without esophagitis: Secondary | ICD-10-CM | POA: Diagnosis not present

## 2022-01-30 DIAGNOSIS — E785 Hyperlipidemia, unspecified: Secondary | ICD-10-CM | POA: Diagnosis not present

## 2022-01-30 DIAGNOSIS — I10 Essential (primary) hypertension: Secondary | ICD-10-CM | POA: Diagnosis not present

## 2022-01-30 DIAGNOSIS — H547 Unspecified visual loss: Secondary | ICD-10-CM | POA: Diagnosis not present

## 2022-01-30 DIAGNOSIS — D649 Anemia, unspecified: Secondary | ICD-10-CM | POA: Diagnosis not present

## 2022-01-30 DIAGNOSIS — Z9181 History of falling: Secondary | ICD-10-CM | POA: Diagnosis not present

## 2022-01-30 DIAGNOSIS — E871 Hypo-osmolality and hyponatremia: Secondary | ICD-10-CM | POA: Diagnosis not present

## 2022-01-30 DIAGNOSIS — F32A Depression, unspecified: Secondary | ICD-10-CM | POA: Diagnosis not present

## 2022-01-30 DIAGNOSIS — Z7983 Long term (current) use of bisphosphonates: Secondary | ICD-10-CM | POA: Diagnosis not present

## 2022-01-30 DIAGNOSIS — E876 Hypokalemia: Secondary | ICD-10-CM | POA: Diagnosis not present

## 2022-01-30 DIAGNOSIS — H8110 Benign paroxysmal vertigo, unspecified ear: Secondary | ICD-10-CM | POA: Diagnosis not present

## 2022-01-30 DIAGNOSIS — Z7982 Long term (current) use of aspirin: Secondary | ICD-10-CM | POA: Diagnosis not present

## 2022-01-30 DIAGNOSIS — Z7902 Long term (current) use of antithrombotics/antiplatelets: Secondary | ICD-10-CM | POA: Diagnosis not present

## 2022-01-30 DIAGNOSIS — G9341 Metabolic encephalopathy: Secondary | ICD-10-CM | POA: Diagnosis not present

## 2022-01-30 DIAGNOSIS — G43909 Migraine, unspecified, not intractable, without status migrainosus: Secondary | ICD-10-CM | POA: Diagnosis not present

## 2022-01-30 DIAGNOSIS — I69398 Other sequelae of cerebral infarction: Secondary | ICD-10-CM | POA: Diagnosis not present

## 2022-02-03 DIAGNOSIS — Z9181 History of falling: Secondary | ICD-10-CM | POA: Diagnosis not present

## 2022-02-03 DIAGNOSIS — E871 Hypo-osmolality and hyponatremia: Secondary | ICD-10-CM | POA: Diagnosis not present

## 2022-02-03 DIAGNOSIS — G43909 Migraine, unspecified, not intractable, without status migrainosus: Secondary | ICD-10-CM | POA: Diagnosis not present

## 2022-02-03 DIAGNOSIS — F32A Depression, unspecified: Secondary | ICD-10-CM | POA: Diagnosis not present

## 2022-02-03 DIAGNOSIS — K219 Gastro-esophageal reflux disease without esophagitis: Secondary | ICD-10-CM | POA: Diagnosis not present

## 2022-02-03 DIAGNOSIS — Z7982 Long term (current) use of aspirin: Secondary | ICD-10-CM | POA: Diagnosis not present

## 2022-02-03 DIAGNOSIS — G9341 Metabolic encephalopathy: Secondary | ICD-10-CM | POA: Diagnosis not present

## 2022-02-03 DIAGNOSIS — I10 Essential (primary) hypertension: Secondary | ICD-10-CM | POA: Diagnosis not present

## 2022-02-03 DIAGNOSIS — H547 Unspecified visual loss: Secondary | ICD-10-CM | POA: Diagnosis not present

## 2022-02-03 DIAGNOSIS — H8110 Benign paroxysmal vertigo, unspecified ear: Secondary | ICD-10-CM | POA: Diagnosis not present

## 2022-02-03 DIAGNOSIS — D649 Anemia, unspecified: Secondary | ICD-10-CM | POA: Diagnosis not present

## 2022-02-03 DIAGNOSIS — E785 Hyperlipidemia, unspecified: Secondary | ICD-10-CM | POA: Diagnosis not present

## 2022-02-03 DIAGNOSIS — Z7983 Long term (current) use of bisphosphonates: Secondary | ICD-10-CM | POA: Diagnosis not present

## 2022-02-03 DIAGNOSIS — E876 Hypokalemia: Secondary | ICD-10-CM | POA: Diagnosis not present

## 2022-02-03 DIAGNOSIS — I69398 Other sequelae of cerebral infarction: Secondary | ICD-10-CM | POA: Diagnosis not present

## 2022-02-03 DIAGNOSIS — Z7902 Long term (current) use of antithrombotics/antiplatelets: Secondary | ICD-10-CM | POA: Diagnosis not present

## 2022-02-04 DIAGNOSIS — I10 Essential (primary) hypertension: Secondary | ICD-10-CM | POA: Diagnosis not present

## 2022-02-04 DIAGNOSIS — Z9181 History of falling: Secondary | ICD-10-CM | POA: Diagnosis not present

## 2022-02-04 DIAGNOSIS — D649 Anemia, unspecified: Secondary | ICD-10-CM | POA: Diagnosis not present

## 2022-02-04 DIAGNOSIS — Z7983 Long term (current) use of bisphosphonates: Secondary | ICD-10-CM | POA: Diagnosis not present

## 2022-02-04 DIAGNOSIS — H8110 Benign paroxysmal vertigo, unspecified ear: Secondary | ICD-10-CM | POA: Diagnosis not present

## 2022-02-04 DIAGNOSIS — E876 Hypokalemia: Secondary | ICD-10-CM | POA: Diagnosis not present

## 2022-02-04 DIAGNOSIS — Z7982 Long term (current) use of aspirin: Secondary | ICD-10-CM | POA: Diagnosis not present

## 2022-02-04 DIAGNOSIS — F32A Depression, unspecified: Secondary | ICD-10-CM | POA: Diagnosis not present

## 2022-02-04 DIAGNOSIS — K219 Gastro-esophageal reflux disease without esophagitis: Secondary | ICD-10-CM | POA: Diagnosis not present

## 2022-02-04 DIAGNOSIS — G43909 Migraine, unspecified, not intractable, without status migrainosus: Secondary | ICD-10-CM | POA: Diagnosis not present

## 2022-02-04 DIAGNOSIS — E871 Hypo-osmolality and hyponatremia: Secondary | ICD-10-CM | POA: Diagnosis not present

## 2022-02-04 DIAGNOSIS — I69398 Other sequelae of cerebral infarction: Secondary | ICD-10-CM | POA: Diagnosis not present

## 2022-02-04 DIAGNOSIS — H547 Unspecified visual loss: Secondary | ICD-10-CM | POA: Diagnosis not present

## 2022-02-04 DIAGNOSIS — E785 Hyperlipidemia, unspecified: Secondary | ICD-10-CM | POA: Diagnosis not present

## 2022-02-04 DIAGNOSIS — Z7902 Long term (current) use of antithrombotics/antiplatelets: Secondary | ICD-10-CM | POA: Diagnosis not present

## 2022-02-04 DIAGNOSIS — G9341 Metabolic encephalopathy: Secondary | ICD-10-CM | POA: Diagnosis not present

## 2022-02-06 DIAGNOSIS — Z7983 Long term (current) use of bisphosphonates: Secondary | ICD-10-CM | POA: Diagnosis not present

## 2022-02-06 DIAGNOSIS — Z7902 Long term (current) use of antithrombotics/antiplatelets: Secondary | ICD-10-CM | POA: Diagnosis not present

## 2022-02-06 DIAGNOSIS — G9341 Metabolic encephalopathy: Secondary | ICD-10-CM | POA: Diagnosis not present

## 2022-02-06 DIAGNOSIS — E785 Hyperlipidemia, unspecified: Secondary | ICD-10-CM | POA: Diagnosis not present

## 2022-02-06 DIAGNOSIS — H8110 Benign paroxysmal vertigo, unspecified ear: Secondary | ICD-10-CM | POA: Diagnosis not present

## 2022-02-06 DIAGNOSIS — I10 Essential (primary) hypertension: Secondary | ICD-10-CM | POA: Diagnosis not present

## 2022-02-06 DIAGNOSIS — F32A Depression, unspecified: Secondary | ICD-10-CM | POA: Diagnosis not present

## 2022-02-06 DIAGNOSIS — Z7982 Long term (current) use of aspirin: Secondary | ICD-10-CM | POA: Diagnosis not present

## 2022-02-06 DIAGNOSIS — H547 Unspecified visual loss: Secondary | ICD-10-CM | POA: Diagnosis not present

## 2022-02-06 DIAGNOSIS — I69398 Other sequelae of cerebral infarction: Secondary | ICD-10-CM | POA: Diagnosis not present

## 2022-02-06 DIAGNOSIS — K219 Gastro-esophageal reflux disease without esophagitis: Secondary | ICD-10-CM | POA: Diagnosis not present

## 2022-02-06 DIAGNOSIS — E871 Hypo-osmolality and hyponatremia: Secondary | ICD-10-CM | POA: Diagnosis not present

## 2022-02-06 DIAGNOSIS — Z9181 History of falling: Secondary | ICD-10-CM | POA: Diagnosis not present

## 2022-02-06 DIAGNOSIS — D649 Anemia, unspecified: Secondary | ICD-10-CM | POA: Diagnosis not present

## 2022-02-06 DIAGNOSIS — E876 Hypokalemia: Secondary | ICD-10-CM | POA: Diagnosis not present

## 2022-02-06 DIAGNOSIS — G43909 Migraine, unspecified, not intractable, without status migrainosus: Secondary | ICD-10-CM | POA: Diagnosis not present

## 2022-02-07 DIAGNOSIS — I10 Essential (primary) hypertension: Secondary | ICD-10-CM | POA: Diagnosis not present

## 2022-02-07 DIAGNOSIS — I69398 Other sequelae of cerebral infarction: Secondary | ICD-10-CM | POA: Diagnosis not present

## 2022-02-07 DIAGNOSIS — E785 Hyperlipidemia, unspecified: Secondary | ICD-10-CM | POA: Diagnosis not present

## 2022-02-07 DIAGNOSIS — E876 Hypokalemia: Secondary | ICD-10-CM | POA: Diagnosis not present

## 2022-02-07 DIAGNOSIS — Z7982 Long term (current) use of aspirin: Secondary | ICD-10-CM | POA: Diagnosis not present

## 2022-02-07 DIAGNOSIS — K219 Gastro-esophageal reflux disease without esophagitis: Secondary | ICD-10-CM | POA: Diagnosis not present

## 2022-02-07 DIAGNOSIS — Z7983 Long term (current) use of bisphosphonates: Secondary | ICD-10-CM | POA: Diagnosis not present

## 2022-02-07 DIAGNOSIS — H547 Unspecified visual loss: Secondary | ICD-10-CM | POA: Diagnosis not present

## 2022-02-07 DIAGNOSIS — H8110 Benign paroxysmal vertigo, unspecified ear: Secondary | ICD-10-CM | POA: Diagnosis not present

## 2022-02-07 DIAGNOSIS — G9341 Metabolic encephalopathy: Secondary | ICD-10-CM | POA: Diagnosis not present

## 2022-02-07 DIAGNOSIS — G43909 Migraine, unspecified, not intractable, without status migrainosus: Secondary | ICD-10-CM | POA: Diagnosis not present

## 2022-02-07 DIAGNOSIS — E871 Hypo-osmolality and hyponatremia: Secondary | ICD-10-CM | POA: Diagnosis not present

## 2022-02-07 DIAGNOSIS — D649 Anemia, unspecified: Secondary | ICD-10-CM | POA: Diagnosis not present

## 2022-02-07 DIAGNOSIS — Z7902 Long term (current) use of antithrombotics/antiplatelets: Secondary | ICD-10-CM | POA: Diagnosis not present

## 2022-02-07 DIAGNOSIS — Z9181 History of falling: Secondary | ICD-10-CM | POA: Diagnosis not present

## 2022-02-07 DIAGNOSIS — F32A Depression, unspecified: Secondary | ICD-10-CM | POA: Diagnosis not present

## 2022-02-10 DIAGNOSIS — I69398 Other sequelae of cerebral infarction: Secondary | ICD-10-CM | POA: Diagnosis not present

## 2022-02-10 DIAGNOSIS — E871 Hypo-osmolality and hyponatremia: Secondary | ICD-10-CM | POA: Diagnosis not present

## 2022-02-10 DIAGNOSIS — G43909 Migraine, unspecified, not intractable, without status migrainosus: Secondary | ICD-10-CM | POA: Diagnosis not present

## 2022-02-10 DIAGNOSIS — F32A Depression, unspecified: Secondary | ICD-10-CM | POA: Diagnosis not present

## 2022-02-10 DIAGNOSIS — G9341 Metabolic encephalopathy: Secondary | ICD-10-CM | POA: Diagnosis not present

## 2022-02-10 DIAGNOSIS — D649 Anemia, unspecified: Secondary | ICD-10-CM | POA: Diagnosis not present

## 2022-02-10 DIAGNOSIS — Z7902 Long term (current) use of antithrombotics/antiplatelets: Secondary | ICD-10-CM | POA: Diagnosis not present

## 2022-02-10 DIAGNOSIS — I10 Essential (primary) hypertension: Secondary | ICD-10-CM | POA: Diagnosis not present

## 2022-02-10 DIAGNOSIS — Z9181 History of falling: Secondary | ICD-10-CM | POA: Diagnosis not present

## 2022-02-10 DIAGNOSIS — E785 Hyperlipidemia, unspecified: Secondary | ICD-10-CM | POA: Diagnosis not present

## 2022-02-10 DIAGNOSIS — H8110 Benign paroxysmal vertigo, unspecified ear: Secondary | ICD-10-CM | POA: Diagnosis not present

## 2022-02-10 DIAGNOSIS — Z7982 Long term (current) use of aspirin: Secondary | ICD-10-CM | POA: Diagnosis not present

## 2022-02-10 DIAGNOSIS — Z7983 Long term (current) use of bisphosphonates: Secondary | ICD-10-CM | POA: Diagnosis not present

## 2022-02-10 DIAGNOSIS — E876 Hypokalemia: Secondary | ICD-10-CM | POA: Diagnosis not present

## 2022-02-10 DIAGNOSIS — K219 Gastro-esophageal reflux disease without esophagitis: Secondary | ICD-10-CM | POA: Diagnosis not present

## 2022-02-10 DIAGNOSIS — H547 Unspecified visual loss: Secondary | ICD-10-CM | POA: Diagnosis not present

## 2022-02-12 DIAGNOSIS — K219 Gastro-esophageal reflux disease without esophagitis: Secondary | ICD-10-CM | POA: Diagnosis not present

## 2022-02-12 DIAGNOSIS — D649 Anemia, unspecified: Secondary | ICD-10-CM | POA: Diagnosis not present

## 2022-02-12 DIAGNOSIS — E876 Hypokalemia: Secondary | ICD-10-CM | POA: Diagnosis not present

## 2022-02-12 DIAGNOSIS — H547 Unspecified visual loss: Secondary | ICD-10-CM | POA: Diagnosis not present

## 2022-02-12 DIAGNOSIS — I69398 Other sequelae of cerebral infarction: Secondary | ICD-10-CM | POA: Diagnosis not present

## 2022-02-12 DIAGNOSIS — Z7983 Long term (current) use of bisphosphonates: Secondary | ICD-10-CM | POA: Diagnosis not present

## 2022-02-12 DIAGNOSIS — Z7982 Long term (current) use of aspirin: Secondary | ICD-10-CM | POA: Diagnosis not present

## 2022-02-12 DIAGNOSIS — Z9181 History of falling: Secondary | ICD-10-CM | POA: Diagnosis not present

## 2022-02-12 DIAGNOSIS — Z7902 Long term (current) use of antithrombotics/antiplatelets: Secondary | ICD-10-CM | POA: Diagnosis not present

## 2022-02-12 DIAGNOSIS — E785 Hyperlipidemia, unspecified: Secondary | ICD-10-CM | POA: Diagnosis not present

## 2022-02-12 DIAGNOSIS — F32A Depression, unspecified: Secondary | ICD-10-CM | POA: Diagnosis not present

## 2022-02-12 DIAGNOSIS — G43909 Migraine, unspecified, not intractable, without status migrainosus: Secondary | ICD-10-CM | POA: Diagnosis not present

## 2022-02-12 DIAGNOSIS — E871 Hypo-osmolality and hyponatremia: Secondary | ICD-10-CM | POA: Diagnosis not present

## 2022-02-12 DIAGNOSIS — I10 Essential (primary) hypertension: Secondary | ICD-10-CM | POA: Diagnosis not present

## 2022-02-12 DIAGNOSIS — H8110 Benign paroxysmal vertigo, unspecified ear: Secondary | ICD-10-CM | POA: Diagnosis not present

## 2022-02-12 DIAGNOSIS — G9341 Metabolic encephalopathy: Secondary | ICD-10-CM | POA: Diagnosis not present

## 2022-02-13 DIAGNOSIS — E876 Hypokalemia: Secondary | ICD-10-CM | POA: Diagnosis not present

## 2022-02-13 DIAGNOSIS — I69398 Other sequelae of cerebral infarction: Secondary | ICD-10-CM | POA: Diagnosis not present

## 2022-02-13 DIAGNOSIS — K219 Gastro-esophageal reflux disease without esophagitis: Secondary | ICD-10-CM | POA: Diagnosis not present

## 2022-02-13 DIAGNOSIS — G9341 Metabolic encephalopathy: Secondary | ICD-10-CM | POA: Diagnosis not present

## 2022-02-13 DIAGNOSIS — Z7983 Long term (current) use of bisphosphonates: Secondary | ICD-10-CM | POA: Diagnosis not present

## 2022-02-13 DIAGNOSIS — Z9181 History of falling: Secondary | ICD-10-CM | POA: Diagnosis not present

## 2022-02-13 DIAGNOSIS — Z7902 Long term (current) use of antithrombotics/antiplatelets: Secondary | ICD-10-CM | POA: Diagnosis not present

## 2022-02-13 DIAGNOSIS — Z7982 Long term (current) use of aspirin: Secondary | ICD-10-CM | POA: Diagnosis not present

## 2022-02-13 DIAGNOSIS — I10 Essential (primary) hypertension: Secondary | ICD-10-CM | POA: Diagnosis not present

## 2022-02-13 DIAGNOSIS — H8110 Benign paroxysmal vertigo, unspecified ear: Secondary | ICD-10-CM | POA: Diagnosis not present

## 2022-02-13 DIAGNOSIS — F32A Depression, unspecified: Secondary | ICD-10-CM | POA: Diagnosis not present

## 2022-02-13 DIAGNOSIS — G43909 Migraine, unspecified, not intractable, without status migrainosus: Secondary | ICD-10-CM | POA: Diagnosis not present

## 2022-02-13 DIAGNOSIS — E785 Hyperlipidemia, unspecified: Secondary | ICD-10-CM | POA: Diagnosis not present

## 2022-02-13 DIAGNOSIS — E871 Hypo-osmolality and hyponatremia: Secondary | ICD-10-CM | POA: Diagnosis not present

## 2022-02-13 DIAGNOSIS — H547 Unspecified visual loss: Secondary | ICD-10-CM | POA: Diagnosis not present

## 2022-02-13 DIAGNOSIS — D649 Anemia, unspecified: Secondary | ICD-10-CM | POA: Diagnosis not present

## 2022-02-25 DIAGNOSIS — H02834 Dermatochalasis of left upper eyelid: Secondary | ICD-10-CM | POA: Diagnosis not present

## 2022-02-25 DIAGNOSIS — H02831 Dermatochalasis of right upper eyelid: Secondary | ICD-10-CM | POA: Diagnosis not present

## 2022-02-25 DIAGNOSIS — H43813 Vitreous degeneration, bilateral: Secondary | ICD-10-CM | POA: Diagnosis not present

## 2022-02-25 DIAGNOSIS — H2512 Age-related nuclear cataract, left eye: Secondary | ICD-10-CM | POA: Diagnosis not present

## 2022-02-25 DIAGNOSIS — H25811 Combined forms of age-related cataract, right eye: Secondary | ICD-10-CM | POA: Diagnosis not present

## 2022-02-25 DIAGNOSIS — I639 Cerebral infarction, unspecified: Secondary | ICD-10-CM | POA: Diagnosis not present

## 2022-04-15 DIAGNOSIS — H2511 Age-related nuclear cataract, right eye: Secondary | ICD-10-CM | POA: Diagnosis not present

## 2022-05-26 NOTE — Progress Notes (Unsigned)
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 Occipital neuralgia Hypertension Hyperlipidemia    1  For occipital neuralgia, duloxetine 30mg  daily 2.  Secondary stroke prevention management as per PCP:             - Plavix 75mg  daily -  statin.  LDL goal less than 70             - Normotensive blood pressure             - Hgb A1c goal less than 7 6  Mediterranean diet 7  Routine exercise 8  Follow up 7 months.    Subjective:  Nicole Cobb is a 81 year old right-handed female with HTN, HLD and history of BPPV who follows up for stroke.  She is accompanied by her daughter.   UPDATE: Current medications:  Plavix 75mg  daily, pravastatin, duloxetine 30mg  daily  30 day cardiac event monitor in February-March evealed NSR with no evidence of a fib or other significant arrhythmia or heart block.   Her PCP switched her statin back to pravastatin.  She is feeling much better.  The headache/occipital neuralgia has resolved.  She is much stronger, no longer using the walker.    HISTORY: Patient presented to [facility] on 01/19/2022 for posterior and right frontal headache with confusion and difficulty following commands.  She did not exhibit any lateralizing symptoms.  On exam, there was concern for left-sided hemianopsia.  CT head showed no acute abnormalities but MRI of brain demonstrated 13 mm acute cortical/subcortical infarct within the right parietal lobe at junction of right MCA and PCA vascular territories.  Did not receive tPA due to outside therapeutic window.  CTA of head and neck was negative for LVO or hemodynamically significant stenosis.  2D echocardiogram showed EF 60-65% with no PFO or other cardiac source of embolus.  LDL was 82 and Hgb A1c was  5.4.  Her pravastatin was changed to atorvastatin.  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  Atorvastatin was increased.  Prior to admission, she was taking Plavix.  She was discharged on ASA 325mg  and Plavix 75mg  daily for 21 days followed by ASA 325mg  daily alone.   Her daughter has been staying with her 24/7.  She is using a walker, which is not new.  She has not had any falls since April of last year.  Since she has been home, she endorses severe paroxysmal shooting pain in the back of her head arising from the right occipital region and radiating up and to the mid occipital region.  Scalp tender to touch.  No neck pain.  Longstanding history of anxiety.  More tearful since the stroke.   01/20/2022 MRI BRAIN WO:  13 mm acute cortical/subcortical infarct within the right parietal lobe (at the junction of the right MCA and PCA vascular territories). Background mild chronic small vessel ischemic changes within the cerebral white matter. 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 01/19/2022 CTA HEAD & NECK:  No emergent large vessel occlusion or high-grade stenosis of the intracranial arteries. No infarct by CT perfusion.Bilateral carotid bifurcation atherosclerosis without hemodynamically significant stenosis. Narrowing of the origin of the left vertebral artery.  PAST MEDICAL HISTORY: Past Medical History:  Diagnosis Date   Anemia    Anxiety    BPV (benign positional vertigo)    Hiatal hernia    HTN (hypertension)    Hyperlipidemia    Hypokalemia    Vertigo     MEDICATIONS: Current Outpatient Medications on File Prior to Visit  Medication Sig Dispense Refill   acetaminophen (TYLENOL) 500 MG tablet Take 500 mg by mouth every 6 (six) hours as needed.     alendronate (FOSAMAX) 70 MG tablet Take 70 mg by mouth once a week.     ALPRAZolam (XANAX) 0.5 MG tablet Take 1 tablet (0.5 mg total)  by mouth 2 (two) times daily as needed for sleep or anxiety. (Patient taking differently: Take 0.5 mg by mouth 2 (two) times daily.) 30 tablet 0   aspirin 325 MG tablet Take 1 tablet (325 mg total) by mouth daily.     atenolol (TENORMIN) 50 MG tablet Take 1 tablet (50 mg total) by mouth daily. 30 tablet 3   atorvastatin (LIPITOR) 40 MG tablet Take 1 tablet (40 mg total) by mouth daily at 6 PM. 30 tablet 1   calcium carbonate (OS-CAL - DOSED IN MG OF ELEMENTAL CALCIUM) 1250 (500 Ca) MG tablet Take 2 tablets by mouth daily with breakfast.     cetirizine (ZYRTEC) 10 MG tablet Take 10 mg by mouth daily as needed for allergies.      Cholecalciferol (D3-1000) 25 MCG (1000 UT) tablet Take 2 tablets (2,000 Units total) by mouth daily.     clopidogrel (PLAVIX) 75 MG tablet Take 1 tablet (75 mg total) by mouth daily. 20 tablet 0   co-enzyme Q-10 50 MG capsule Take 200 mg by mouth at bedtime.      DULoxetine (CYMBALTA) 30 MG capsule Take 1 capsule (30 mg total) by mouth daily. 30 capsule 5   fluticasone (FLONASE) 50 MCG/ACT nasal spray Place 1 spray into both nostrils daily as needed.      guaiFENesin (MUCINEX) 600 MG 12 hr tablet Take 600 mg by mouth 2 (two) times daily.     losartan (COZAAR) 25 MG tablet Take 25 mg by mouth daily.     Lutein 20 MG CAPS Take 20 mg by mouth daily.     omeprazole (PRILOSEC) 20 MG capsule Take 20 mg by mouth daily.     Pseudoephedrine HCl (SUDAFED PO) Take 1 tablet by mouth daily as needed (congestion).     vitamin B-12 (CYANOCOBALAMIN) 500 MCG tablet Take by mouth.     No current facility-administered medications on file prior to visit.    ALLERGIES: No Known Allergies  FAMILY HISTORY: Family History  Problem Relation Age of Onset   Diabetes Mother    Cancer Mother        breast    Heart failure Mother    Cancer Father        lymphoma   Migraines Sister    Heart failure Brother    Heart attack Brother    Cancer Brother        stomach      Objective:   Blood pressure (!) 143/78, pulse 90, height  (1.626 m), weight 166 lb 6.4 oz (75.5 kg), SpO2 94 %. General: No acute distress.  Patient appears well-groomed.   Head:  Normocephalic/atraumatic Eyes:  Fundi examined but not visualized Heart:  Regular rate and rhythm Neurological Exam: alert and oriented to person, place, and time.  Speech fluent and not dysarthric, language intact.  Right ptosis (post-surgical).  Otherwise, CN II-XII intact. Bulk and tone normal, muscle strength 5/5 throughout.  Sensation to light touch intact.  Deep tendon reflexes 2+ throughout.  Finger to nose testing intact.  Gait normal, Romberg with sway.   Shon Millet, DO  CC: Elfredia Nevins, MD

## 2022-05-27 ENCOUNTER — Encounter: Payer: Self-pay | Admitting: Neurology

## 2022-05-27 ENCOUNTER — Ambulatory Visit: Payer: Medicare Other | Admitting: Neurology

## 2022-05-27 VITALS — BP 143/78 | HR 90 | Ht 64.0 in | Wt 166.4 lb

## 2022-05-27 DIAGNOSIS — M5481 Occipital neuralgia: Secondary | ICD-10-CM

## 2022-05-27 DIAGNOSIS — I63431 Cerebral infarction due to embolism of right posterior cerebral artery: Secondary | ICD-10-CM

## 2022-05-27 DIAGNOSIS — I10 Essential (primary) hypertension: Secondary | ICD-10-CM

## 2022-05-27 DIAGNOSIS — E785 Hyperlipidemia, unspecified: Secondary | ICD-10-CM

## 2022-05-27 MED ORDER — DULOXETINE HCL 30 MG PO CPEP: 30.0000 mg | ORAL_CAPSULE | Freq: Every day | ORAL | 6 refills | Status: DC

## 2022-05-27 NOTE — Patient Instructions (Addendum)
Duloxetine 30mg  daily Follow up 7 months.

## 2022-05-29 DIAGNOSIS — H2512 Age-related nuclear cataract, left eye: Secondary | ICD-10-CM | POA: Diagnosis not present

## 2022-11-18 DIAGNOSIS — G43909 Migraine, unspecified, not intractable, without status migrainosus: Secondary | ICD-10-CM | POA: Diagnosis not present

## 2022-11-18 DIAGNOSIS — Z0001 Encounter for general adult medical examination with abnormal findings: Secondary | ICD-10-CM | POA: Diagnosis not present

## 2022-11-18 DIAGNOSIS — K219 Gastro-esophageal reflux disease without esophagitis: Secondary | ICD-10-CM | POA: Diagnosis not present

## 2022-11-18 DIAGNOSIS — D518 Other vitamin B12 deficiency anemias: Secondary | ICD-10-CM | POA: Diagnosis not present

## 2022-11-18 DIAGNOSIS — I10 Essential (primary) hypertension: Secondary | ICD-10-CM | POA: Diagnosis not present

## 2022-11-18 DIAGNOSIS — E039 Hypothyroidism, unspecified: Secondary | ICD-10-CM | POA: Diagnosis not present

## 2022-11-18 DIAGNOSIS — E559 Vitamin D deficiency, unspecified: Secondary | ICD-10-CM | POA: Diagnosis not present

## 2022-11-18 DIAGNOSIS — I69398 Other sequelae of cerebral infarction: Secondary | ICD-10-CM | POA: Diagnosis not present

## 2022-12-24 DIAGNOSIS — R748 Abnormal levels of other serum enzymes: Secondary | ICD-10-CM | POA: Diagnosis not present

## 2022-12-26 DIAGNOSIS — U071 COVID-19: Secondary | ICD-10-CM | POA: Diagnosis not present

## 2022-12-26 DIAGNOSIS — I639 Cerebral infarction, unspecified: Secondary | ICD-10-CM | POA: Diagnosis not present

## 2023-01-08 NOTE — Progress Notes (Signed)
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 Hypertension Hyperlipidemia Occipital neuralgia, stable     1  For occipital neuralgia, duloxetine 30mg  daily 2.  Secondary stroke prevention management as per PCP:             - Plavix 75mg  daily -  statin.  LDL goal less than 70             - Normotensive blood pressure             - Hgb A1c goal less than 7 6  Mediterranean diet 7  Routine exercise 8  Follow up one year for continued management of occipital neuralgia.    Subjective:  Nicole Cobb is a 82 year old right-handed female with HTN, HLD and history of BPPV who follows up for stroke.  She is accompanied by her daughter.   UPDATE: Current medications:  Plavix 75mg  daily, pravastatin, duloxetine 30mg  daily  Recently had COVID.  Still feels a little weak but overall much better.  Occipital neuralgia controlled on duloxetine.    HISTORY: Patient presented to [facility] on 01/19/2022 for posterior and right frontal headache with confusion and difficulty following commands.  She did not exhibit any lateralizing symptoms.  On exam, there was concern for left-sided hemianopsia.  CT head showed no acute abnormalities but MRI of brain demonstrated 13 mm acute cortical/subcortical infarct within the right parietal lobe at junction of right MCA and PCA vascular territories.  Did not receive tPA due to outside therapeutic window.  CTA of head and neck was negative for LVO or hemodynamically significant stenosis.  2D echocardiogram showed EF 60-65% with no PFO or other cardiac source of embolus.  LDL was 82 and Hgb A1c was 5.4.  Her pravastatin was changed to atorvastatin.  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  Atorvastatin was increased.  Prior to admission, she was taking Plavix.  She was discharged on ASA 325mg  and Plavix 75mg  daily for 21 days followed by ASA 325mg  daily alone.  30 day cardiac event monitor in February-March evealed NSR with no evidence of a fib or other significant arrhythmia or heart block.   Following the stroke, she had occipital neuralgia.  Started duloxetine.  It has resolved.    01/20/2022 MRI BRAIN WO:  13 mm acute cortical/subcortical infarct within the right parietal lobe (at the junction of the right MCA and PCA vascular territories). Background mild chronic small vessel ischemic changes within the cerebral white matter. 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 01/19/2022 CTA HEAD & NECK:  No emergent large vessel occlusion or high-grade stenosis of the intracranial arteries. No infarct by CT perfusion.Bilateral carotid bifurcation atherosclerosis without hemodynamically significant stenosis. Narrowing of the origin of the left vertebral artery.  PAST MEDICAL HISTORY: Past Medical History:  Diagnosis Date   Anemia    Anxiety    BPV (benign positional vertigo)    Hiatal hernia    HTN (hypertension)    Hyperlipidemia    Hypokalemia    Vertigo     MEDICATIONS: Current Outpatient Medications on File Prior to Visit  Medication Sig Dispense Refill   acetaminophen (TYLENOL) 500 MG tablet Take 500 mg by mouth every 6 (six) hours  as needed.     alendronate (FOSAMAX) 70 MG tablet Take 70 mg by mouth once a week.     ALPRAZolam (XANAX) 0.5 MG tablet Take 1 tablet (0.5 mg total) by mouth 2 (two) times daily as needed for sleep or anxiety. (Patient taking differently: Take 0.5 mg by mouth 2 (two) times daily.) 30 tablet 0   atenolol (TENORMIN) 50 MG tablet Take 1 tablet (50 mg total) by mouth daily. 30 tablet 3   calcium carbonate (OS-CAL - DOSED IN MG OF ELEMENTAL CALCIUM) 1250 (500 Ca) MG tablet Take 2  tablets by mouth daily with breakfast.     cetirizine (ZYRTEC) 10 MG tablet Take 10 mg by mouth daily as needed for allergies.      Cholecalciferol (D3-1000) 25 MCG (1000 UT) tablet Take 2 tablets (2,000 Units total) by mouth daily.     clopidogrel (PLAVIX) 75 MG tablet Take 1 tablet (75 mg total) by mouth daily. 20 tablet 0   co-enzyme Q-10 50 MG capsule Take 200 mg by mouth at bedtime.      DULoxetine (CYMBALTA) 30 MG capsule Take 1 capsule (30 mg total) by mouth daily. 30 capsule 6   fluticasone (FLONASE) 50 MCG/ACT nasal spray Place 1 spray into both nostrils daily as needed.      guaiFENesin (MUCINEX) 600 MG 12 hr tablet Take 600 mg by mouth 2 (two) times daily.     iron polysaccharides (NIFEREX) 150 MG capsule Take 150 mg by mouth daily.     lactose free nutrition (BOOST) LIQD Take 237 mLs by mouth 3 (three) times daily between meals.     losartan (COZAAR) 25 MG tablet Take 25 mg by mouth daily.     Lutein 20 MG CAPS Take 20 mg by mouth daily.     omeprazole (PRILOSEC) 20 MG capsule Take 20 mg by mouth daily.     pravastatin (PRAVACHOL) 40 MG tablet Take 40 mg by mouth daily.     Pseudoephedrine HCl (SUDAFED PO) Take 1 tablet by mouth daily as needed (congestion).     vitamin B-12 (CYANOCOBALAMIN) 500 MCG tablet Take by mouth.     No current facility-administered medications on file prior to visit.    ALLERGIES: No Known Allergies  FAMILY HISTORY: Family History  Problem Relation Age of Onset   Diabetes Mother    Cancer Mother        breast    Heart failure Mother    Cancer Father        lymphoma   Cancer Sister    Migraines Sister    Heart failure Brother    Heart attack Brother    Cancer Brother        stomach      Objective:  Blood pressure (!) 167/80, pulse 73, height 5\' 4"  (1.626 m), weight 170 lb (77.1 kg), SpO2 96 %. General: No acute distress.  Patient appears well-groomed.   Head:  Normocephalic/atraumatic Eyes:  Fundi examined but not visualized Heart:   Regular rate and rhythm Neurological Exam: alert and oriented to person, place, and time.  Speech fluent and not dysarthric, language intact.  Mild right ptosis (post-surgical).  Otherwise, CN II-XII intact. Bulk and tone normal, muscle strength 5/5 throughout.  Sensation to light touch intact.  Deep tendon reflexes 2+ throughout.  Finger to nose testing intact.  Gait normal, Romberg negative.  Metta Clines, DO  CC: Redmond School, MD

## 2023-01-12 ENCOUNTER — Encounter: Payer: Self-pay | Admitting: Neurology

## 2023-01-12 ENCOUNTER — Ambulatory Visit: Payer: Medicare Other | Admitting: Neurology

## 2023-01-12 VITALS — BP 167/80 | HR 73 | Ht 64.0 in | Wt 170.0 lb

## 2023-01-12 DIAGNOSIS — E785 Hyperlipidemia, unspecified: Secondary | ICD-10-CM | POA: Diagnosis not present

## 2023-01-12 DIAGNOSIS — I10 Essential (primary) hypertension: Secondary | ICD-10-CM

## 2023-01-12 DIAGNOSIS — I63431 Cerebral infarction due to embolism of right posterior cerebral artery: Secondary | ICD-10-CM | POA: Diagnosis not present

## 2023-01-12 DIAGNOSIS — M5481 Occipital neuralgia: Secondary | ICD-10-CM

## 2023-01-12 MED ORDER — DULOXETINE HCL 30 MG PO CPEP: 30.0000 mg | ORAL_CAPSULE | Freq: Every day | ORAL | 11 refills | Status: DC

## 2023-01-12 NOTE — Patient Instructions (Signed)
Continue duloxetine 30mg  daily Follow up one year

## 2023-03-05 ENCOUNTER — Emergency Department (HOSPITAL_COMMUNITY)
Admission: EM | Admit: 2023-03-05 | Discharge: 2023-03-05 | Disposition: A | Discharge status: Home / Self Care | Payer: Medicare Other | Attending: Emergency Medicine | Admitting: Emergency Medicine

## 2023-03-05 ENCOUNTER — Encounter (HOSPITAL_COMMUNITY): Payer: Self-pay

## 2023-03-05 ENCOUNTER — Emergency Department (HOSPITAL_COMMUNITY): Payer: Medicare Other

## 2023-03-05 ENCOUNTER — Other Ambulatory Visit: Payer: Self-pay

## 2023-03-05 ENCOUNTER — Ambulatory Visit: Payer: Self-pay | Admitting: Orthopedic Surgery

## 2023-03-05 DIAGNOSIS — S82841A Displaced bimalleolar fracture of right lower leg, initial encounter for closed fracture: Secondary | ICD-10-CM | POA: Insufficient documentation

## 2023-03-05 DIAGNOSIS — W010XXA Fall on same level from slipping, tripping and stumbling without subsequent striking against object, initial encounter: Secondary | ICD-10-CM | POA: Diagnosis not present

## 2023-03-05 DIAGNOSIS — S99911A Unspecified injury of right ankle, initial encounter: Secondary | ICD-10-CM | POA: Diagnosis not present

## 2023-03-05 DIAGNOSIS — S82831A Other fracture of upper and lower end of right fibula, initial encounter for closed fracture: Secondary | ICD-10-CM | POA: Diagnosis not present

## 2023-03-05 DIAGNOSIS — Z743 Need for continuous supervision: Secondary | ICD-10-CM | POA: Diagnosis not present

## 2023-03-05 DIAGNOSIS — Z01818 Encounter for other preprocedural examination: Secondary | ICD-10-CM

## 2023-03-05 DIAGNOSIS — Z7902 Long term (current) use of antithrombotics/antiplatelets: Secondary | ICD-10-CM | POA: Insufficient documentation

## 2023-03-05 DIAGNOSIS — M25571 Pain in right ankle and joints of right foot: Secondary | ICD-10-CM | POA: Diagnosis not present

## 2023-03-05 DIAGNOSIS — S8251XA Displaced fracture of medial malleolus of right tibia, initial encounter for closed fracture: Secondary | ICD-10-CM | POA: Diagnosis not present

## 2023-03-05 DIAGNOSIS — W19XXXA Unspecified fall, initial encounter: Secondary | ICD-10-CM | POA: Diagnosis not present

## 2023-03-05 MED ORDER — OXYCODONE-ACETAMINOPHEN 5-325 MG PO TABS: 1.0000 | ORAL_TABLET | Freq: Four times a day (QID) | ORAL | 0 refills | Status: AC | PRN

## 2023-03-05 MED ORDER — IBUPROFEN 800 MG PO TABS
800.0000 mg | ORAL_TABLET | Freq: Once | ORAL | Status: AC
  Administered 2023-03-05: 800 mg via ORAL
  Filled 2023-03-05: qty 1

## 2023-03-05 MED ORDER — FENTANYL CITRATE PF 50 MCG/ML IJ SOSY
50.0000 ug | PREFILLED_SYRINGE | Freq: Once | INTRAMUSCULAR | Status: AC
  Administered 2023-03-05: 50 ug via INTRAVENOUS
  Filled 2023-03-05: qty 1

## 2023-03-05 NOTE — ED Provider Notes (Signed)
[facility] Provider Note   CSN: [id number] Arrival date & time: 03/05/23  1016     History Chief Complaint  Patient presents with   Nicole Cobb is a 82 y.o. female patient who presents to the emergency department today for further evaluation of a mechanical trip and fall and subsequent right ankle injury.  This occurred prior to arrival.  Patient states that she had some shoes on that were slick and went to step down a ramp to pick up a rodent trap when she slipped and fell backwards.  She felt a popping sensation in the right ankle followed by immediate pain.  She denies hitting her head.  Patient is not anticoagulated.  She denies any other injury.   Fall       Home Medications Prior to Admission medications   Medication Sig Start Date End Date Taking? Authorizing Provider  acetaminophen (TYLENOL) 500 MG tablet Take 500 mg by mouth every 6 (six) hours as needed.   Yes [provider]  alendronate (FOSAMAX) 70 MG tablet Take 70 mg by mouth once a week. 10/30/21  Yes [provider]  ALPRAZolam Duanne Moron) 0.5 MG tablet Take 1 tablet (0.5 mg total) by mouth 2 (two) times daily as needed for sleep or anxiety. Patient taking differently: Take 0.5 mg by mouth 2 (two) times daily. 09/02/13  Yes Kathie Dike, MD  atenolol (TENORMIN) 50 MG tablet Take 1 tablet (50 mg total) by mouth daily. 07/17/14  Yes Rexene Alberts, MD  calcium carbonate (OS-CAL - DOSED IN MG OF ELEMENTAL CALCIUM) 1250 (500 Ca) MG tablet Take 2 tablets by mouth daily with breakfast.   Yes [provider]  cetirizine (ZYRTEC) 10 MG tablet Take 10 mg by mouth daily as needed for allergies.    Yes [provider]  Cholecalciferol (D3-1000) 25 MCG (1000 UT) tablet Take 2 tablets (2,000 Units total) by mouth daily. 01/21/22  Yes Tat, Shanon Brow, MD  clopidogrel (PLAVIX) 75 MG tablet Take 1 tablet (75 mg total) by mouth daily. 01/22/22  Yes  Tat, Shanon Brow, MD  co-enzyme Q-10 50 MG capsule Take 200 mg by mouth at bedtime.    Yes [provider]  DULoxetine (CYMBALTA) 30 MG capsule Take 1 capsule (30 mg total) by mouth daily. 01/12/23  Yes Jaffe, Adam R, DO  lactose free nutrition (BOOST) LIQD Take 237 mLs by mouth 3 (three) times daily between meals.   Yes [provider]  losartan (COZAAR) 50 MG tablet Take 50 mg by mouth daily. 11/18/22  Yes [provider]  Lutein 20 MG CAPS Take 20 mg by mouth daily.   Yes [provider]  omeprazole (PRILOSEC) 20 MG capsule Take 20 mg by mouth daily.   Yes [provider]  oxyCODONE-acetaminophen (PERCOCET/ROXICET) 5-325 MG tablet Take 1 tablet by mouth every 6 (six) hours as needed for up to 3 days for severe pain. 03/05/23 03/08/23 Yes Oval Moralez M, PA-C  pravastatin (PRAVACHOL) 40 MG tablet Take 40 mg by mouth daily. 03/13/22  Yes [provider]  vitamin B-12 (CYANOCOBALAMIN) 500 MCG tablet Take by mouth.   Yes [provider]  fluticasone (FLONASE) 50 MCG/ACT nasal spray Place 1 spray into both nostrils daily as needed.     [provider]  iron polysaccharides (NIFEREX) 150 MG capsule Take 150 mg by mouth daily. Patient not taking: Reported on 03/05/2023    [provider]  Zinc  Sulfate (ZINC 15 PO) Take by mouth. Patient not taking: Reported on 03/05/2023    [provider]      Allergies    Patient has no known allergies.    Review of Systems   Review of Systems  All other systems reviewed and are negative.   Physical Exam Updated Vital Signs BP (!) 136/59 (BP Location: Left Arm)   Pulse 72   Temp 98.6 F (37 C) (Oral)   Resp 16   Ht '5\' 4"'$  (1.626 m)   Wt 76.7 kg   SpO2 99%   BMI 29.01 kg/m  Physical Exam Vitals and nursing note reviewed.  Constitutional:      Appearance: Normal appearance.  HENT:     Head: Normocephalic and atraumatic.  Eyes:     General:        Right eye: No  discharge.        Left eye: No discharge.     Conjunctiva/sclera: Conjunctivae normal.  Pulmonary:     Effort: Pulmonary effort is normal.  Feet:     Comments: Obvious deformity to the right ankle.  Right foot is everted.  There is moderate swelling and ecchymosis.  2+ dorsalis pedis pulse felt on the right foot.  Toes have good cap refill.  Good sensation. Skin:    General: Skin is warm and dry.     Findings: No rash.  Neurological:     General: No focal deficit present.     Mental Status: She is alert.  Psychiatric:        Mood and Affect: Mood normal.        Behavior: Behavior normal.     ED Results / Procedures / Treatments   Labs (all labs ordered are listed, but only abnormal results are displayed) Labs Reviewed - No data to display  EKG None  Radiology DG Ankle Complete Right  Result Date: 03/05/2023 CLINICAL DATA:  Post reduction EXAM: RIGHT ANKLE - COMPLETE 3+ VIEW COMPARISON:  Earlier same day FINDINGS: Fine bony detail obscured by overlying fiberglass. By malleolar fracture/subluxation at the ankle again noted with similar lateral subluxation of the talus relative to the distal tibia. Medial malleolar fracture fragment is also displaced laterally. Apex medial angulation noted at the comminuted distal fibular fracture IMPRESSION: Similar appearance of bimalleolar fracture/subluxation at the ankle. Fine bony detail obscured by overlying fiberglass. Electronically Signed   By: Misty Stanley M.D.   On: 03/05/2023 14:29   DG Ankle Complete Right  Result Date: 03/05/2023 CLINICAL DATA:  fall with obvious deformity EXAM: RIGHT ANKLE - COMPLETE 3+ VIEW COMPARISON:  None Available. FINDINGS: Osteopenia. There is a comminuted distal fibular diaphyseal fracture extending to the syndesmosis with posterolateral angulation and mild displacement. There is a displaced medial malleolar fracture. There is lateral subluxation of the talus with respect to the tibia. Tiny fracture fragment  adjacent to the lateral talar process, unclear origin. Extensive ankle soft tissue swelling. Plantar and dorsal calcaneal spurring. IMPRESSION: Comminuted and displaced distal fibular fracture and displaced medial malleolar fracture. Lateral subluxation of the talus with respect to the tibia. Extensive ankle soft tissue swelling. Osteopenia. Electronically Signed   By: Maurine Simmering M.D.   On: 03/05/2023 11:45    Procedures .Ortho Injury Treatment  Date/Time: 03/05/2023 2:26 PM  Performed by: Hendricks Limes, PA-C Authorized by: Hendricks Limes, PA-C   Consent:    Consent obtained:  Verbal   Consent given by:  Patient   Risks discussed:  Archie Endo  location: ankle Location details: right ankle Injury type: fracture Fracture type: bimalleolar Pre-procedure neurovascular assessment: neurovascularly intact  Anesthesia: Local anesthesia used: no  Patient sedated: NoManipulation performed: yes Skeletal traction used: yes Reduction successful: no X-ray confirmed reduction: yes Immobilization: splint Splint type: ankle stirrup and short leg Splint Applied by: Ortho Tech Supplies used: Ortho-Glass Post-procedure neurovascular assessment: post-procedure neurovascularly intact       Medications Ordered in ED Medications  ibuprofen (ADVIL) tablet 800 mg (800 mg Oral Given 03/05/23 1214)  fentaNYL (SUBLIMAZE) injection 50 mcg (50 mcg Intravenous Given 03/05/23 1348)    ED Course/ Medical Decision Making/ A&P Clinical Course as of 03/05/23 1445  Thu Mar 05, 2023  1208 DG Ankle Complete Right I personally ordered and interpreted the study and it does appear to have a by malleoli are fracture of the right ankle. [CF]  1209 On reassessment, patient's pain level is beginning to increase.  Will plan to give her some ibuprofen per patient request. [CF]  1215 I spoke with Dr. Amedeo Kinsman with orthopedics who recommends attempting to reduce, splint, and follow-up in the office. [CF]  1829 I  had 2+ dorsalis pedis pulse and posterior tibialis pulses with Doppler in the right foot. [CF]  1403 We applied a stirrup and posterior stirrup to the right ankle.  I provided traction.  15 mcg of fentanyl was given prior to procedure. [CF]    Clinical Course User Index [CF] Hendricks Limes, PA-C   {   Click here for ABCD2, HEART and other calculators  Medical Decision Making Nicole Cobb is a 82 y.o. female patient who presents to the emergency department today after mechanical trip and fall and subsequent right ankle injury.  There is an obvious deformity to the right ankle.  Will get imaging to confirm fracture.  Offered patient pain medication which she currently declined.  Spoke with Dr. Amedeo Kinsman with orthopedics who recommends reduction, splint and follow-up in his office.  Postreduction did not reveal any significant change.  I will give her narcotic pain medication for breakthrough pain.  I will also write her prescription for a wheelchair.  Daughter will offer care with her at home.  Strict turn precautions were discussed.  She is safe for discharge.  Amount and/or Complexity of Data Reviewed Radiology: ordered. Decision-making details documented in ED Course.  Risk Prescription drug management.     Final Clinical Impression(s) / ED Diagnoses Final diagnoses:  Closed bimalleolar fracture of right ankle, initial encounter    Rx / DC Orders ED Discharge Orders          Ordered    oxyCODONE-acetaminophen (PERCOCET/ROXICET) 5-325 MG tablet  Every 6 hours PRN        03/05/23 1439              Myna [place], PA-C 03/05/23 1445    Milton Ferguson, MD 03/06/23 1710

## 2023-03-05 NOTE — ED Triage Notes (Signed)
EMS reports pt slipped on loose board on her ramp at home.  No LOC, obvious deformity to right ankle.  Denies any hitting her head & no other pain anywhere else.  Pt states she took her morning meds including her xanax just prior to EMS picking her up. Pt has positive pulses.

## 2023-03-05 NOTE — Discharge Instructions (Addendum)
I would encourage you to take 600 mg of ibuprofen every 6 hours as needed for pain.  I am also going to give you some narcotic pain medication for breakthrough pain.  Please take sparingly.  Please return to emergency room if any worsening symptoms.

## 2023-03-09 NOTE — Patient Instructions (Addendum)
Nicole Cobb  03/09/2023     @PREFPERIOPPHARMACY @   Your procedure is scheduled on  03/12/2023.   Report to [facility] at  0700  A.M.   Call this number if you have problems the morning of surgery:  [phone]  If you experience any cold or flu symptoms such as cough, fever, chills, shortness of breath, etc. between now and your scheduled surgery, please notify us at the above number.   Remember:  Do not eat after midnight.   You may drink clear liquids until  0500 am on 03/12/2023.       Clear liquids allowed are:                    Water, Juice (No red color; non-citric and without pulp; diabetics please choose diet or no sugar options), Carbonated beverages (diabetics please choose diet or no sugar options), Clear Tea (No creamer, milk, or cream, including half & half and powdered creamer), Black Coffee Only (No creamer, milk or cream, including half & half and powdered creamer), Plain Jell-O Only (No red color; diabetics please choose no sugar options), Clear Sports drink (No red color; diabetics please choose diet or no sugar options), and Plain Popsicles Only (No red color; diabetics please choose no sugar options)       At 0500 am on 03/12/2023 drink your carb drink. You can have nothing else to drink after this.    Take these medicines the morning of surgery with A SIP OF WATER         xanax(if needed), atenolol, cymbalta, omeprazole.     Do not wear jewelry, make-up or nail polish.  Do not wear lotions, powders, or perfumes, or deodorant.  Do not shave 48 hours prior to surgery.  Men may shave face and neck.  Do not bring valuables to the hospital.  [facility] is not responsible for any belongings or valuables.  Contacts, dentures or bridgework may not be worn into surgery.  Leave your suitcase in the car.  After surgery it may be brought to your room.  For patients admitted to the hospital, discharge time will be determined by your treatment  team.  Patients discharged the day of surgery will not be allowed to drive home and must have someone with them for 24 hours.    Special instructions:   DO NOT smoke tobacco or vape for 24 hours before your procedure.  Please read over the following fact sheets that you were given. Pain Booklet, Coughing and Deep Breathing, Surgical Site Infection Prevention, Anesthesia Post-op Instructions, and Care and Recovery After Surgery       Displaced Medial or Posterior Malleolar Ankle Fracture Treated With ORIF, Care After This sheet gives you information about how to care for yourself after your procedure. Your health care provider may also give you more specific instructions. If you have problems or questions, contact your health care provider. What can I expect after the procedure? After the procedure, it is common to have: Pain. Swelling. A small amount of fluid from your incision. Follow these instructions at home: If you have a boot: Wear the boot as told by your health care provider. Remove it only as told by your health care provider. Loosen the boot if your toes tingle, become numb, or turn cold and blue. Keep the boot clean and dry. If you have a cast: Do not stick anything inside the cast to scratch your skin.  Doing that increases your risk of infection. Check the skin around the cast every day. Tell your health care provider about any concerns. You may put lotion on dry skin around the edges of the cast. Do not put lotion on the skin underneath the cast. Keep the cast clean and dry. Bathing Do not take baths, swim, or use a hot tub until your health care provider approves. Ask your health care provider if you can take showers. If your boot or cast is not waterproof: Do not let it get wet. Cover it with a watertight covering when you take a bath or a shower. Keep the bandage (dressing) dry until your health care provider says it can be removed. Incision care  Follow  instructions from your health care provider about how to take care of your incision. Make sure you: Wash your hands with soap and water for at least 20 seconds before and after you change your dressing. If soap and water are not available, use hand sanitizer. Change your dressing as told by your health care provider. Leave stitches (sutures), skin glue, or adhesive strips in place. These skin closures may need to stay in place for 2 weeks or longer. If adhesive strip edges start to loosen and curl up, you may trim the loose edges. Do not remove adhesive strips completely unless your health care provider tells you to do that. Check your incision area every day for signs of infection. Check for: Redness. More pain or swelling. Blood or more fluid. Warmth. Pus or a bad smell. Managing pain, stiffness, and swelling  If directed, put ice on the affected area. To do this: If you have a removable boot, remove it as told by your health care provider. Put ice in a plastic bag. Place a towel between your skin and the bag or between your cast and the bag. Leave the ice on for 20 minutes, 2-3 times a day. Remove the ice if your skin turns bright red. This is very important. If you cannot feel pain, heat, or cold, you have a greater risk of damage to the area. Move your toes often to reduce stiffness and swelling. Raise (elevate) the injured area above the level of your heart while you are sitting or lying down. To do this, try putting a few pillows under your leg and ankle. Activity Do exercises as told by your health care provider or physical therapist. Do not use your injured limb to support (bear) your body weight until your health care provider says that you can. Follow weight-bearing restrictions as told. Use crutches or other devices to help you move around (assistive devices) as told by your health care provider. Ask your health care provider when it is safe to drive if you have a boot or cast on  your foot. Return to your normal activities as told by your health care provider. Ask your health care provider what activities are safe for you. General instructions Do not put pressure on any part of the cast until it is fully hardened. This may take several hours. Do not use any products that contain nicotine or tobacco, such as cigarettes, e-cigarettes, and chewing tobacco. These can delay bone healing after surgery. If you need help quitting, ask your health care provider. Take over-the-counter and prescription medicines only as told by your health care provider. Ask your health care provider if the medicine prescribed to you: Requires you to avoid driving or using machinery. Can cause constipation. You may need to  take these actions to prevent or treat constipation: Drink enough fluid to keep your urine pale yellow. Take over-the-counter or prescription medicines. Eat foods that are high in fiber, such as beans, whole grains, and fresh fruits and vegetables. Limit foods that are high in fat and processed sugars, such as fried or sweet foods. Keep all follow-up visits. This is important. Contact a health care provider if: You have a fever. Your pain medicine is not helping. You have redness around your incision. You have more swelling or pain around your incision. You have blood or more fluid coming from your incision or leaking through your cast. Your incision feels warm to the touch. You have pus or a bad smell coming from your incision or dressing. Get help right away if: The edges of your incision come apart after the sutures or staples have been taken out. You have chest pain. You have trouble breathing. You have numbness or tingling in your foot or leg. Your foot becomes cold, pale, or blue. You have pain or swelling in your calf. These symptoms may represent a serious problem that is an emergency. Do not wait to see if the symptoms will go away. Get medical help right away.  Call your local emergency services (911 in the [place] Do not drive yourself to the hospital. Summary After the procedure, it is common to have some pain and swelling. If your boot or cast is not waterproof, do not let it get wet. Contact your health care provider if you have more pain or swelling, or if you have blood or more fluid coming from your incision or leaking through your cast. Get help right away if you have numbness or tingling in your foot or leg, or if your foot becomes cold, pale, or blue. This information is not intended to replace advice given to you by your health care provider. Make sure you discuss any questions you have with your health care provider. Document Revised: 04/09/2020 Document Reviewed: 04/09/2020 Elsevier Patient Education  [place] Anesthesia, Adult, Care After The following information offers guidance on how to care for yourself after your procedure. Your health care provider may also give you more specific instructions. If you have problems or questions, contact your health care provider. What can I expect after the procedure? After the procedure, it is common for people to: Have pain or discomfort at the IV site. Have nausea or vomiting. Have a sore throat or hoarseness. Have trouble concentrating. Feel cold or chills. Feel weak, sleepy, or tired (fatigue). Have soreness and body aches. These can affect parts of the body that were not involved in surgery. Follow these instructions at home: For the time period you were told by your health care provider:  Rest. Do not participate in activities where you could fall or become injured. Do not drive or use machinery. Do not drink alcohol. Do not take sleeping pills or medicines that cause drowsiness. Do not make important decisions or sign legal documents. Do not take care of children on your own. General instructions Drink enough fluid to keep your urine pale yellow. If you have sleep  apnea, surgery and certain medicines can increase your risk for breathing problems. Follow instructions from your health care provider about wearing your sleep device: Anytime you are sleeping, including during daytime naps. While taking prescription pain medicines, sleeping medicines, or medicines that make you drowsy. Return to your normal activities as told by your health care provider. Ask your health care  provider what activities are safe for you. Take over-the-counter and prescription medicines only as told by your health care provider. Do not use any products that contain nicotine or tobacco. These products include cigarettes, chewing tobacco, and vaping devices, such as e-cigarettes. These can delay incision healing after surgery. If you need help quitting, ask your health care provider. Contact a health care provider if: You have nausea or vomiting that does not get better with medicine. You vomit every time you eat or drink. You have pain that does not get better with medicine. You cannot urinate or have bloody urine. You develop a skin rash. You have a fever. Get help right away if: You have trouble breathing. You have chest pain. You vomit blood. These symptoms may be an emergency. Get help right away. Call 911. Do not wait to see if the symptoms will go away. Do not drive yourself to the hospital. Summary After the procedure, it is common to have a sore throat, hoarseness, nausea, vomiting, or to feel weak, sleepy, or fatigue. For the time period you were told by your health care provider, do not drive or use machinery. Get help right away if you have difficulty breathing, have chest pain, or vomit blood. These symptoms may be an emergency. This information is not intended to replace advice given to you by your health care provider. Make sure you discuss any questions you have with your health care provider. Document Revised: 03/07/2022 Document Reviewed: 03/07/2022 Elsevier  Patient Education  [place]. How to Use Chlorhexidine Before Surgery Chlorhexidine gluconate (CHG) is a germ-killing (antiseptic) solution that is used to clean the skin. It can get rid of the bacteria that normally live on the skin and can keep them away for about 24 hours. To clean your skin with CHG, you may be given: A CHG solution to use in the shower or as part of a sponge bath. A prepackaged cloth that contains CHG. Cleaning your skin with CHG may help lower the risk for infection: While you are staying in the intensive care unit of the hospital. If you have a vascular access, such as a central line, to provide short-term or long-term access to your veins. If you have a catheter to drain urine from your bladder. If you are on a ventilator. A ventilator is a machine that helps you breathe by moving air in and out of your lungs. After surgery. What are the risks? Risks of using CHG include: A skin reaction. Hearing loss, if CHG gets in your ears and you have a perforated eardrum. Eye injury, if CHG gets in your eyes and is not rinsed out. The CHG product catching fire. Make sure that you avoid smoking and flames after applying CHG to your skin. Do not use CHG: If you have a chlorhexidine allergy or have previously reacted to chlorhexidine. On babies younger than 5 months of age. How to use CHG solution Use CHG only as told by your health care provider, and follow the instructions on the label. Use the full amount of CHG as directed. Usually, this is one bottle. During a shower Follow these steps when using CHG solution during a shower (unless your health care provider gives you different instructions): Start the shower. Use your normal soap and shampoo to wash your face and hair. Turn off the shower or move out of the shower stream. Pour the CHG onto a clean washcloth. Do not use any type of brush or rough-edged sponge. Starting at  your neck, lather your body down to your  toes. Make sure you follow these instructions: If you will be having surgery, pay special attention to the part of your body where you will be having surgery. Scrub this area for at least 1 minute. Do not use CHG on your head or face. If the solution gets into your ears or eyes, rinse them well with water. Avoid your genital area. Avoid any areas of skin that have broken skin, cuts, or scrapes. Scrub your back and under your arms. Make sure to wash skin folds. Let the lather sit on your skin for 1-2 minutes or as long as told by your health care provider. Thoroughly rinse your entire body in the shower. Make sure that all body creases and crevices are rinsed well. Dry off with a clean towel. Do not put any substances on your body afterward--such as powder, lotion, or perfume--unless you are told to do so by your health care provider. Only use lotions that are recommended by the manufacturer. Put on clean clothes or pajamas. If it is the night before your surgery, sleep in clean sheets.  During a sponge bath Follow these steps when using CHG solution during a sponge bath (unless your health care provider gives you different instructions): Use your normal soap and shampoo to wash your face and hair. Pour the CHG onto a clean washcloth. Starting at your neck, lather your body down to your toes. Make sure you follow these instructions: If you will be having surgery, pay special attention to the part of your body where you will be having surgery. Scrub this area for at least 1 minute. Do not use CHG on your head or face. If the solution gets into your ears or eyes, rinse them well with water. Avoid your genital area. Avoid any areas of skin that have broken skin, cuts, or scrapes. Scrub your back and under your arms. Make sure to wash skin folds. Let the lather sit on your skin for 1-2 minutes or as long as told by your health care provider. Using a different clean, wet washcloth, thoroughly rinse  your entire body. Make sure that all body creases and crevices are rinsed well. Dry off with a clean towel. Do not put any substances on your body afterward--such as powder, lotion, or perfume--unless you are told to do so by your health care provider. Only use lotions that are recommended by the manufacturer. Put on clean clothes or pajamas. If it is the night before your surgery, sleep in clean sheets. How to use CHG prepackaged cloths Only use CHG cloths as told by your health care provider, and follow the instructions on the label. Use the CHG cloth on clean, dry skin. Do not use the CHG cloth on your head or face unless your health care provider tells you to. When washing with the CHG cloth: Avoid your genital area. Avoid any areas of skin that have broken skin, cuts, or scrapes. Before surgery Follow these steps when using a CHG cloth to clean before surgery (unless your health care provider gives you different instructions): Using the CHG cloth, vigorously scrub the part of your body where you will be having surgery. Scrub using a back-and-forth motion for 3 minutes. The area on your body should be completely wet with CHG when you are done scrubbing. Do not rinse. Discard the cloth and let the area air-dry. Do not put any substances on the area afterward, such as powder, lotion, or perfume.  Put on clean clothes or pajamas. If it is the night before your surgery, sleep in clean sheets.  For general bathing Follow these steps when using CHG cloths for general bathing (unless your health care provider gives you different instructions). Use a separate CHG cloth for each area of your body. Make sure you wash between any folds of skin and between your fingers and toes. Wash your body in the following order, switching to a new cloth after each step: The front of your neck, shoulders, and chest. Both of your arms, under your arms, and your hands. Your stomach and groin area, avoiding the  genitals. Your right leg and foot. Your left leg and foot. The back of your neck, your back, and your buttocks. Do not rinse. Discard the cloth and let the area air-dry. Do not put any substances on your body afterward--such as powder, lotion, or perfume--unless you are told to do so by your health care provider. Only use lotions that are recommended by the manufacturer. Put on clean clothes or pajamas. Contact a health care provider if: Your skin gets irritated after scrubbing. You have questions about using your solution or cloth. You swallow any chlorhexidine. Call your local poison control center (1-(502) 420-2040 in the [place] Get help right away if: Your eyes itch badly, or they become very red or swollen. Your skin itches badly and is red or swollen. Your hearing changes. You have trouble seeing. You have swelling or tingling in your mouth or throat. You have trouble breathing. These symptoms may represent a serious problem that is an emergency. Do not wait to see if the symptoms will go away. Get medical help right away. Call your local emergency services (911 in the [place] Do not drive yourself to the hospital. Summary Chlorhexidine gluconate (CHG) is a germ-killing (antiseptic) solution that is used to clean the skin. Cleaning your skin with CHG may help to lower your risk for infection. You may be given CHG to use for bathing. It may be in a bottle or in a prepackaged cloth to use on your skin. Carefully follow your health care provider's instructions and the instructions on the product label. Do not use CHG if you have a chlorhexidine allergy. Contact your health care provider if your skin gets irritated after scrubbing. This information is not intended to replace advice given to you by your health care provider. Make sure you discuss any questions you have with your health care provider. Document Revised: 04/07/2022 Document Reviewed: 02/18/2021 Elsevier Patient Education  [place].

## 2023-03-10 ENCOUNTER — Encounter: Payer: Self-pay | Admitting: Orthopedic Surgery

## 2023-03-10 ENCOUNTER — Ambulatory Visit (INDEPENDENT_AMBULATORY_CARE_PROVIDER_SITE_OTHER): Payer: Medicare Other | Admitting: Orthopedic Surgery

## 2023-03-10 DIAGNOSIS — S82841A Displaced bimalleolar fracture of right lower leg, initial encounter for closed fracture: Secondary | ICD-10-CM

## 2023-03-10 MED ORDER — TRAMADOL HCL 50 MG PO TABS: 50.0000 mg | ORAL_TABLET | Freq: Four times a day (QID) | ORAL | 0 refills | Status: DC | PRN

## 2023-03-10 NOTE — Progress Notes (Signed)
New Patient Visit  Assessment: Nicole Cobb is a 82 y.o. female with the following: 1. Closed bimalleolar fracture of right ankle, initial encounter  Plan: Nicole Cobb sustained a bimalleolar right ankle fracture, which will require surgery.  Ace wrap's were removed, as these appear to be too tight.  A new Ace wrap was replaced.  Advised to elevate the foot is much as possible in preparation for surgery.  She is to remain nonweightbearing.  I provided her with an updated prescription for tramadol.  Will plan to proceed with surgery on 03/12/2023.  She will no longer take Plavix, preparation for surgery.  Risks and benefits of the surgery, including, but not limited to infection, bleeding, persistent pain, need for further surgery, non-union, malunion and more severe complications associated with anesthesia were discussed with the patient.  The patient has elected to proceed.    Follow-up: Return for After surgery; DOS 03/12/23.  Subjective:  Chief Complaint  Patient presents with   Fracture    Rt ankle DOI 03/05/23    History of Present Illness: Nicole Cobb is a 82 y.o. female who presents for evaluation of right ankle pain.  A few days prior, she was leaving her house, when she slipped and fell.  She twisted her ankle.  She states it happened so fast, and she is unaware of what she had.  She had immediate pain.  She was on her front porch for about an hour, try to get some help.  She was brought to the emergency department, and diagnosed with a right ankle fracture.  She was placed in a splint.  She has remained nonweightbearing.  Her pain has been controlled.  She does have some issues in the back of her leg, which has been caused by the material from her splint.   Review of Systems: No fevers or chills No numbness or tingling No chest pain No shortness of breath No bowel or bladder dysfunction No GI distress No headaches   Medical History:  Past Medical History:   Diagnosis Date   Anemia    Anxiety    BPV (benign positional vertigo)    Hiatal hernia    HTN (hypertension)    Hyperlipidemia    Hypokalemia    Vertigo     Past Surgical History:  Procedure Laterality Date   CATARACT EXTRACTION Right    TUBAL LIGATION      Family History  Problem Relation Age of Onset   Diabetes Mother    Cancer Mother        breast    Heart failure Mother    Cancer Father        lymphoma   Cancer Sister    Migraines Sister    Heart failure Brother    Heart attack Brother    Cancer Brother        stomach   Social History   Tobacco Use   Smoking status: Never   Smokeless tobacco: Never  Substance Use Topics   Alcohol use: No    Alcohol/week: 0.0 standard drinks of alcohol   Drug use: No    No Known Allergies  Current Meds  Medication Sig   traMADol (ULTRAM) 50 MG tablet Take 1 tablet (50 mg total) by mouth every 6 (six) hours as needed.    Objective: There were no vitals taken for this visit.  Physical Exam:  General: Elderly female., Alert and oriented., No acute distress., and Seated in a wheelchair. Gait: Unable  to ambulate.  Splint to the right lower extremity is clean, dry and intact.  Ace wraps appear to be tight.  No excessive swelling.  She has sensation of the dorsum of the right foot.  Active motion intact in EHL/TA.  Some mild skin breakdown popliteal fossa from the superior extent of the splint.  Toes are warm and well-perfused.  IMAGING: I personally reviewed images previously obtained from the ED  X-rays emergency department demonstrates a comminuted distal fibula fracture, with a transverse medial malleolus fracture.  There is lateral displacement of the talus   New Medications:  Meds ordered this encounter  Medications   traMADol (ULTRAM) 50 MG tablet    Sig: Take 1 tablet (50 mg total) by mouth every 6 (six) hours as needed.    Dispense:  20 tablet    Refill:  0      [name], MD  03/10/2023 1:52  PM

## 2023-03-10 NOTE — H&P (View-Only) (Signed)
New Patient Visit  Assessment: Nicole Cobb is a 82 y.o. female with the following: 1. Closed bimalleolar fracture of right ankle, initial encounter  Plan: Nicole Cobb sustained a bimalleolar right ankle fracture, which will require surgery.  Ace wrap's were removed, as these appear to be too tight.  A new Ace wrap was replaced.  Advised to elevate the foot is much as possible in preparation for surgery.  She is to remain nonweightbearing.  I provided her with an updated prescription for tramadol.  Will plan to proceed with surgery on 03/12/2023.  She will no longer take Plavix, preparation for surgery.  Risks and benefits of the surgery, including, but not limited to infection, bleeding, persistent pain, need for further surgery, non-union, malunion and more severe complications associated with anesthesia were discussed with the patient.  The patient has elected to proceed.    Follow-up: Return for After surgery; DOS 03/12/23.  Subjective:  Chief Complaint  Patient presents with   Fracture    Rt ankle DOI 03/05/23    History of Present Illness: Nicole Cobb is a 83 y.o. female who presents for evaluation of right ankle pain.  A few days prior, she was leaving her house, when she slipped and fell.  She twisted her ankle.  She states it happened so fast, and she is unaware of what she had.  She had immediate pain.  She was on her front porch for about an hour, try to get some help.  She was brought to the emergency department, and diagnosed with a right ankle fracture.  She was placed in a splint.  She has remained nonweightbearing.  Her pain has been controlled.  She does have some issues in the back of her leg, which has been caused by the material from her splint.   Review of Systems: No fevers or chills No numbness or tingling No chest pain No shortness of breath No bowel or bladder dysfunction No GI distress No headaches   Medical History:  Past Medical History:   Diagnosis Date   Anemia    Anxiety    BPV (benign positional vertigo)    Hiatal hernia    HTN (hypertension)    Hyperlipidemia    Hypokalemia    Vertigo     Past Surgical History:  Procedure Laterality Date   CATARACT EXTRACTION Right    TUBAL LIGATION      Family History  Problem Relation Age of Onset   Diabetes Mother    Cancer Mother        breast    Heart failure Mother    Cancer Father        lymphoma   Cancer Sister    Migraines Sister    Heart failure Brother    Heart attack Brother    Cancer Brother        stomach   Social History   Tobacco Use   Smoking status: Never   Smokeless tobacco: Never  Substance Use Topics   Alcohol use: No    Alcohol/week: 0.0 standard drinks of alcohol   Drug use: No    No Known Allergies  Current Meds  Medication Sig   traMADol (ULTRAM) 50 MG tablet Take 1 tablet (50 mg total) by mouth every 6 (six) hours as needed.    Objective: There were no vitals taken for this visit.  Physical Exam:  General: Elderly female., Alert and oriented., No acute distress., and Seated in a wheelchair. Gait: Unable  to ambulate.  Splint to the right lower extremity is clean, dry and intact.  Ace wraps appear to be tight.  No excessive swelling.  She has sensation of the dorsum of the right foot.  Active motion intact in EHL/TA.  Some mild skin breakdown popliteal fossa from the superior extent of the splint.  Toes are warm and well-perfused.  IMAGING: I personally reviewed images previously obtained from the ED  X-rays emergency department demonstrates a comminuted distal fibula fracture, with a transverse medial malleolus fracture.  There is lateral displacement of the talus   New Medications:  Meds ordered this encounter  Medications   traMADol (ULTRAM) 50 MG tablet    Sig: Take 1 tablet (50 mg total) by mouth every 6 (six) hours as needed.    Dispense:  20 tablet    Refill:  0      [name], MD  03/10/2023 1:52  PM

## 2023-03-10 NOTE — Patient Instructions (Addendum)
Plan for surgery 03/12/23  No Plavix until after surgery

## 2023-03-11 ENCOUNTER — Encounter (HOSPITAL_COMMUNITY): Payer: Self-pay

## 2023-03-11 ENCOUNTER — Encounter (HOSPITAL_COMMUNITY)
Admission: RE | Admit: 2023-03-11 | Discharge: 2023-03-11 | Disposition: A | Discharge status: Home / Self Care | Payer: Medicare Other | Source: Ambulatory Visit | Attending: Orthopedic Surgery | Admitting: Orthopedic Surgery

## 2023-03-11 VITALS — BP 136/51 | HR 76 | Temp 97.7°F | Resp 18 | Ht 64.0 in | Wt 169.0 lb

## 2023-03-11 DIAGNOSIS — Z01818 Encounter for other preprocedural examination: Secondary | ICD-10-CM | POA: Diagnosis not present

## 2023-03-11 DIAGNOSIS — I10 Essential (primary) hypertension: Secondary | ICD-10-CM | POA: Insufficient documentation

## 2023-03-11 HISTORY — DX: Cerebral infarction, unspecified: I63.9

## 2023-03-11 LAB — BASIC METABOLIC PANEL
Anion gap: 10 (ref 5–15)
BUN: 15 mg/dL (ref 8–23)
CO2: 24 mmol/L (ref 22–32)
Calcium: 8.7 mg/dL — ABNORMAL LOW (ref 8.9–10.3)
Chloride: 102 mmol/L (ref 98–111)
Creatinine, Ser: 0.71 mg/dL (ref 0.44–1.00)
GFR, Estimated: >60 mL/min (ref >60)
Glucose, Bld: 186 mg/dL — ABNORMAL HIGH (ref 70–99)
Potassium: 4 mmol/L (ref 3.5–5.1)
Sodium: 136 mmol/L (ref 135–145)

## 2023-03-11 LAB — CBC
HCT: 31.7 % — ABNORMAL LOW (ref 36.0–46.0)
Hemoglobin: 10.5 g/dL — ABNORMAL LOW (ref 12.0–15.0)
MCH: 31.4 pg (ref 26.0–34.0)
MCHC: 33.1 g/dL (ref 30.0–36.0)
MCV: 94.9 fL (ref 80.0–100.0)
Platelets: 219 10*3/uL (ref 150–400)
RBC: 3.34 MIL/uL — ABNORMAL LOW (ref 3.87–5.11)
RDW: 13 % (ref 11.5–15.5)
WBC: 7 10*3/uL (ref 4.0–10.5)
nRBC: 0 % (ref 0.0–0.2)

## 2023-03-12 ENCOUNTER — Ambulatory Visit (HOSPITAL_BASED_OUTPATIENT_CLINIC_OR_DEPARTMENT_OTHER): Payer: Medicare Other | Admitting: Anesthesiology

## 2023-03-12 ENCOUNTER — Ambulatory Visit (HOSPITAL_COMMUNITY): Payer: Medicare Other

## 2023-03-12 ENCOUNTER — Ambulatory Visit (HOSPITAL_COMMUNITY)
Admission: RE | Admit: 2023-03-12 | Discharge: 2023-03-12 | Disposition: A | Discharge status: Home / Self Care | Payer: Medicare Other | Attending: Orthopedic Surgery | Admitting: Orthopedic Surgery

## 2023-03-12 ENCOUNTER — Encounter (HOSPITAL_COMMUNITY): Payer: Self-pay | Admitting: Orthopedic Surgery

## 2023-03-12 ENCOUNTER — Encounter (HOSPITAL_COMMUNITY)
Admission: RE | Disposition: A | Discharge status: Home / Self Care | Payer: Self-pay | Source: Home / Self Care | Attending: Orthopedic Surgery

## 2023-03-12 ENCOUNTER — Ambulatory Visit (HOSPITAL_COMMUNITY): Payer: Medicare Other | Admitting: Anesthesiology

## 2023-03-12 DIAGNOSIS — Z4789 Encounter for other orthopedic aftercare: Secondary | ICD-10-CM | POA: Diagnosis not present

## 2023-03-12 DIAGNOSIS — S82841A Displaced bimalleolar fracture of right lower leg, initial encounter for closed fracture: Secondary | ICD-10-CM

## 2023-03-12 DIAGNOSIS — S82831A Other fracture of upper and lower end of right fibula, initial encounter for closed fracture: Secondary | ICD-10-CM

## 2023-03-12 DIAGNOSIS — D649 Anemia, unspecified: Secondary | ICD-10-CM | POA: Diagnosis not present

## 2023-03-12 DIAGNOSIS — W19XXXA Unspecified fall, initial encounter: Secondary | ICD-10-CM | POA: Insufficient documentation

## 2023-03-12 DIAGNOSIS — S82301A Unspecified fracture of lower end of right tibia, initial encounter for closed fracture: Secondary | ICD-10-CM | POA: Diagnosis not present

## 2023-03-12 DIAGNOSIS — S82301D Unspecified fracture of lower end of right tibia, subsequent encounter for closed fracture with routine healing: Secondary | ICD-10-CM | POA: Diagnosis not present

## 2023-03-12 DIAGNOSIS — G8918 Other acute postprocedural pain: Secondary | ICD-10-CM | POA: Diagnosis not present

## 2023-03-12 DIAGNOSIS — I6930 Unspecified sequelae of cerebral infarction: Secondary | ICD-10-CM | POA: Diagnosis not present

## 2023-03-12 DIAGNOSIS — I10 Essential (primary) hypertension: Secondary | ICD-10-CM | POA: Diagnosis not present

## 2023-03-12 DIAGNOSIS — S82831D Other fracture of upper and lower end of right fibula, subsequent encounter for closed fracture with routine healing: Secondary | ICD-10-CM | POA: Diagnosis not present

## 2023-03-12 HISTORY — PX: ORIF ANKLE FRACTURE: SHX5408

## 2023-03-12 SURGERY — OPEN REDUCTION INTERNAL FIXATION (ORIF) ANKLE FRACTURE
Anesthesia: General | Site: Ankle | Laterality: Right

## 2023-03-12 MED ORDER — BUPIVACAINE HCL (PF) 0.25 % IJ SOLN
INTRAMUSCULAR | Status: AC
  Filled 2023-03-12: qty 30

## 2023-03-12 MED ORDER — MIDAZOLAM HCL 2 MG/2ML IJ SOLN: 1.0000 mg | Freq: Once | INTRAMUSCULAR | Status: AC

## 2023-03-12 MED ORDER — FENTANYL CITRATE (PF) 100 MCG/2ML IJ SOLN
INTRAMUSCULAR | Status: DC | PRN
  Administered 2023-03-12: 50 ug via INTRAVENOUS

## 2023-03-12 MED ORDER — BUPIVACAINE-EPINEPHRINE (PF) 0.25% -1:200000 IJ SOLN
INTRAMUSCULAR | Status: DC | PRN
  Administered 2023-03-12: 7 mL via PERINEURAL
  Administered 2023-03-12: 9 mL via PERINEURAL

## 2023-03-12 MED ORDER — 0.9 % SODIUM CHLORIDE (POUR BTL) OPTIME
TOPICAL | Status: DC | PRN
  Administered 2023-03-12: 1000 mL

## 2023-03-12 MED ORDER — CHLORHEXIDINE GLUCONATE 0.12 % MT SOLN
15.0000 mL | Freq: Once | OROMUCOSAL | Status: AC
  Administered 2023-03-12: 15 mL via OROMUCOSAL

## 2023-03-12 MED ORDER — BUPIVACAINE-EPINEPHRINE (PF) 0.5% -1:200000 IJ SOLN
INTRAMUSCULAR | Status: AC
  Filled 2023-03-12: qty 30

## 2023-03-12 MED ORDER — SUGAMMADEX SODIUM 200 MG/2ML IV SOLN
INTRAVENOUS | Status: DC | PRN
  Administered 2023-03-12: 153.4 mg via INTRAVENOUS

## 2023-03-12 MED ORDER — LACTATED RINGERS IV SOLN: INTRAVENOUS | Status: DC

## 2023-03-12 MED ORDER — LIDOCAINE HCL (PF) 1 % IJ SOLN
INTRAMUSCULAR | Status: DC | PRN
  Administered 2023-03-12 (×2): 2 mL

## 2023-03-12 MED ORDER — ACETAMINOPHEN 500 MG PO TABS: 1000.0000 mg | ORAL_TABLET | Freq: Three times a day (TID) | ORAL | 0 refills | Status: AC

## 2023-03-12 MED ORDER — ONDANSETRON HCL 4 MG/2ML IJ SOLN
INTRAMUSCULAR | Status: DC | PRN
  Administered 2023-03-12: 4 mg via INTRAVENOUS

## 2023-03-12 MED ORDER — MIDAZOLAM HCL 2 MG/2ML IJ SOLN
INTRAMUSCULAR | Status: AC
  Administered 2023-03-12 (×2): 0.5 mg
  Filled 2023-03-12: qty 2

## 2023-03-12 MED ORDER — PROPOFOL 10 MG/ML IV BOLUS
INTRAVENOUS | Status: AC
  Filled 2023-03-12: qty 20

## 2023-03-12 MED ORDER — ORAL CARE MOUTH RINSE: 15.0000 mL | Freq: Once | OROMUCOSAL | Status: AC

## 2023-03-12 MED ORDER — PHENYLEPHRINE 80 MCG/ML (10ML) SYRINGE FOR IV PUSH (FOR BLOOD PRESSURE SUPPORT)
PREFILLED_SYRINGE | INTRAVENOUS | Status: DC | PRN
  Administered 2023-03-12 (×4): 80 ug via INTRAVENOUS

## 2023-03-12 MED ORDER — DEXAMETHASONE SODIUM PHOSPHATE 4 MG/ML IJ SOLN
INTRAMUSCULAR | Status: DC | PRN
  Administered 2023-03-12 (×2): 4 mg via PERINEURAL

## 2023-03-12 MED ORDER — ESMOLOL HCL 100 MG/10ML IV SOLN
INTRAVENOUS | Status: DC | PRN
  Administered 2023-03-12: 20 mg via INTRAVENOUS

## 2023-03-12 MED ORDER — LIDOCAINE HCL (PF) 1 % IJ SOLN
INTRAMUSCULAR | Status: AC
  Filled 2023-03-12: qty 30

## 2023-03-12 MED ORDER — BUPIVACAINE HCL (PF) 0.25 % IJ SOLN: INTRAMUSCULAR | Status: DC | PRN

## 2023-03-12 MED ORDER — CEFAZOLIN SODIUM-DEXTROSE 2-4 GM/100ML-% IV SOLN
2.0000 g | INTRAVENOUS | Status: AC
  Administered 2023-03-12: 2 g via INTRAVENOUS
  Filled 2023-03-12: qty 100

## 2023-03-12 MED ORDER — DEXAMETHASONE SODIUM PHOSPHATE 10 MG/ML IJ SOLN
INTRAMUSCULAR | Status: DC | PRN
  Administered 2023-03-12: 5 mg via INTRAVENOUS

## 2023-03-12 MED ORDER — SUCCINYLCHOLINE CHLORIDE 200 MG/10ML IV SOSY
PREFILLED_SYRINGE | INTRAVENOUS | Status: DC | PRN
  Administered 2023-03-12: 100 mg via INTRAVENOUS

## 2023-03-12 MED ORDER — ROCURONIUM BROMIDE 100 MG/10ML IV SOLN
INTRAVENOUS | Status: DC | PRN
  Administered 2023-03-12: 30 mg via INTRAVENOUS

## 2023-03-12 MED ORDER — FENTANYL CITRATE PF 50 MCG/ML IJ SOSY
PREFILLED_SYRINGE | INTRAMUSCULAR | Status: AC
  Filled 2023-03-12: qty 2

## 2023-03-12 MED ORDER — ESMOLOL HCL 100 MG/10ML IV SOLN
INTRAVENOUS | Status: AC
  Filled 2023-03-12: qty 10

## 2023-03-12 MED ORDER — CELECOXIB 100 MG PO CAPS: 100.0000 mg | ORAL_CAPSULE | Freq: Every day | ORAL | 0 refills | Status: AC

## 2023-03-12 MED ORDER — ONDANSETRON HCL 4 MG PO TABS: 4.0000 mg | ORAL_TABLET | Freq: Three times a day (TID) | ORAL | 0 refills | Status: AC | PRN

## 2023-03-12 MED ORDER — BUPIVACAINE-EPINEPHRINE (PF) 0.25% -1:200000 IJ SOLN
INTRAMUSCULAR | Status: AC
  Filled 2023-03-12: qty 30

## 2023-03-12 MED ORDER — BUPIVACAINE HCL 0.25 % IJ SOLN
INTRAMUSCULAR | Status: DC | PRN
  Administered 2023-03-12: 10 mL

## 2023-03-12 MED ORDER — PROPOFOL 10 MG/ML IV BOLUS
INTRAVENOUS | Status: DC | PRN
  Administered 2023-03-12: 120 mg via INTRAVENOUS

## 2023-03-12 MED ORDER — MIDAZOLAM HCL 2 MG/2ML IJ SOLN
INTRAMUSCULAR | Status: AC
  Filled 2023-03-12: qty 2

## 2023-03-12 MED ORDER — BUPIVACAINE HCL (PF) 0.25 % IJ SOLN
INTRAMUSCULAR | Status: DC | PRN
  Administered 2023-03-12: 10 mL via PERINEURAL
  Administered 2023-03-12: 7 mL via PERINEURAL

## 2023-03-12 MED ORDER — FENTANYL CITRATE (PF) 100 MCG/2ML IJ SOLN
INTRAMUSCULAR | Status: AC
  Filled 2023-03-12: qty 2

## 2023-03-12 MED ORDER — ASPIRIN 81 MG PO TBEC: 81.0000 mg | DELAYED_RELEASE_TABLET | Freq: Two times a day (BID) | ORAL | 0 refills | Status: DC

## 2023-03-12 MED ORDER — DEXAMETHASONE SODIUM PHOSPHATE 4 MG/ML IJ SOLN
INTRAMUSCULAR | Status: AC
  Filled 2023-03-12: qty 2

## 2023-03-12 SURGICAL SUPPLY — 65 items
APL PRP STRL LF DISP 70% ISPRP (MISCELLANEOUS) ×1
BANDAGE ESMARK 4X12 BL STRL LF (DISPOSABLE) ×1 IMPLANT
BIT DRILL 2 CANN GRADUATED (BIT) IMPLANT
BIT DRILL 2.5 CANN LNG (BIT) IMPLANT
BIT DRILL 2.6 CANN (BIT) IMPLANT
BLADE SURG 15 STRL LF DISP TIS (BLADE) ×1 IMPLANT
BLADE SURG 15 STRL SS (BLADE) ×1
BNDG CMPR 12X4 ELC STRL LF (DISPOSABLE) ×1
BNDG CMPR 5X4 CHSV STRCH STRL (GAUZE/BANDAGES/DRESSINGS) ×1
BNDG CMPR STD VLCR NS LF 5.8X4 (GAUZE/BANDAGES/DRESSINGS) ×1
BNDG COHESIVE 4X5 TAN STRL LF (GAUZE/BANDAGES/DRESSINGS) IMPLANT
BNDG ELASTIC 4X5.8 VLCR NS LF (GAUZE/BANDAGES/DRESSINGS) ×1 IMPLANT
BNDG ELASTIC 4X5.8 VLCR STR LF (GAUZE/BANDAGES/DRESSINGS) IMPLANT
BNDG ESMARK 4X12 BLUE STRL LF (DISPOSABLE) ×1
CHLORAPREP W/TINT 26 (MISCELLANEOUS) ×1 IMPLANT
CLOTH BEACON ORANGE TIMEOUT ST (SAFETY) ×1 IMPLANT
COVER LIGHT HANDLE STERIS (MISCELLANEOUS) ×2 IMPLANT
COVER MAYO STAND XLG (MISCELLANEOUS) IMPLANT
CUFF TOURN SGL QUICK 34 (TOURNIQUET CUFF) ×1
CUFF TRNQT CYL 34X4.125X (TOURNIQUET CUFF) ×1 IMPLANT
DRAPE C-ARM FOLDED MOBILE STRL (DRAPES) ×1 IMPLANT
DRAPE C-ARMOR (DRAPES) ×1 IMPLANT
ELECT REM PT RETURN 9FT ADLT (ELECTROSURGICAL) ×1
ELECTRODE REM PT RTRN 9FT ADLT (ELECTROSURGICAL) ×1 IMPLANT
GAUZE SPONGE 4X4 12PLY STRL (GAUZE/BANDAGES/DRESSINGS) ×1 IMPLANT
GAUZE XEROFORM 1X8 LF (GAUZE/BANDAGES/DRESSINGS) ×1 IMPLANT
GAUZE XEROFORM 5X9 LF (GAUZE/BANDAGES/DRESSINGS) IMPLANT
GLOVE BIO SURGEON STRL SZ7 (GLOVE) IMPLANT
GLOVE BIO SURGEON STRL SZ8 (GLOVE) ×2 IMPLANT
GLOVE BIOGEL PI IND STRL 7.0 (GLOVE) ×2 IMPLANT
GLOVE ECLIPSE 6.5 STRL STRAW (GLOVE) IMPLANT
GLOVE SRG 8 PF TXTR STRL LF DI (GLOVE) ×1 IMPLANT
GLOVE SURG UNDER POLY LF SZ8 (GLOVE) ×1
GOWN STRL REUS W/ TWL XL LVL3 (GOWN DISPOSABLE) ×1 IMPLANT
GOWN STRL REUS W/TWL LRG LVL3 (GOWN DISPOSABLE) ×2 IMPLANT
GOWN STRL REUS W/TWL XL LVL3 (GOWN DISPOSABLE) ×1
INST SET MINOR BONE (KITS) ×1 IMPLANT
K-WIRE BB-TAK (WIRE) ×2
KIT TURNOVER KIT A (KITS) ×1 IMPLANT
KWIRE BB-TAK (WIRE) IMPLANT
MANIFOLD NEPTUNE II (INSTRUMENTS) ×1 IMPLANT
NDL HYPO 21X1.5 SAFETY (NEEDLE) IMPLANT
NEEDLE HYPO 21X1.5 SAFETY (NEEDLE) ×1 IMPLANT
NS IRRIG 1000ML POUR BTL (IV SOLUTION) ×1 IMPLANT
PACK BASIC LIMB (CUSTOM PROCEDURE TRAY) ×1 IMPLANT
PAD ABD 5X9 TENDERSORB (GAUZE/BANDAGES/DRESSINGS) ×4 IMPLANT
PAD ARMBOARD 7.5X6 YLW CONV (MISCELLANEOUS) ×1 IMPLANT
PAD CAST 4YDX4 CTTN HI CHSV (CAST SUPPLIES) IMPLANT
PADDING CAST ABS COTTON 4X4 ST (CAST SUPPLIES) ×2 IMPLANT
PADDING CAST COTTON 4X4 STRL (CAST SUPPLIES) ×2
PLATE DIST FIB 8H (Plate) IMPLANT
SCREW CANN 4.0X50 (Screw) ×2 IMPLANT
SCREW CANN T15 ST 50X4 ST (Screw) IMPLANT
SCREW COMP KREULOCK 2.7X12 (Screw) IMPLANT
SCREW COMP KREULOCK 2.7X14 (Screw) IMPLANT
SCREW LOW PROFILE 3.5X14 (Screw) IMPLANT
SCREW LOW PROFILE 3.5X16 (Screw) IMPLANT
SET BASIN LINEN APH (SET/KITS/TRAYS/PACK) ×1 IMPLANT
SPLINT PLASTER CAST XFAST 5X30 (CAST SUPPLIES) IMPLANT
SPONGE T-LAP 18X18 ~~LOC~~+RFID (SPONGE) ×1 IMPLANT
SUT ETHILON 3 0 FSL (SUTURE) ×1 IMPLANT
SUT MON AB 0 CT1 (SUTURE) IMPLANT
SUT MON AB 2-0 CT1 36 (SUTURE) IMPLANT
SYR 30ML LL (SYRINGE) IMPLANT
SYR BULB IRRIG 60ML STRL (SYRINGE) ×1 IMPLANT

## 2023-03-12 NOTE — Anesthesia Preprocedure Evaluation (Signed)
Anesthesia Evaluation  Patient identified by MRN, date of birth, ID band Patient awake    Reviewed: Allergy & Precautions, H&P , NPO status , Patient's Chart, lab work & pertinent test results  Airway Mallampati: II  TM Distance: >3 FB Neck ROM: Full    Dental  (+) Dental Advisory Given, Teeth Intact   Pulmonary neg pulmonary ROS   Pulmonary exam normal breath sounds clear to auscultation       Cardiovascular Exercise Tolerance: Good hypertension, Pt. on medications Normal cardiovascular exam Rhythm:Regular Rate:Normal     Neuro/Psych  Headaches PSYCHIATRIC DISORDERS Anxiety     TIA Neuromuscular disease CVA, Residual Symptoms    GI/Hepatic Neg liver ROS, hiatal hernia,GERD  Medicated,,  Endo/Other  negative endocrine ROS    Renal/GU negative Renal ROS  negative genitourinary   Musculoskeletal Right ankle fx   Abdominal   Peds negative pediatric ROS (+)  Hematology  (+) Blood dyscrasia, anemia   Anesthesia Other Findings   Reproductive/Obstetrics negative OB ROS                             Anesthesia Physical Anesthesia Plan  ASA: 3  Anesthesia Plan: General   Post-op Pain Management: Dilaudid IV and Regional block*   Induction: Intravenous  PONV Risk Score and Plan: 4 or greater and Ondansetron and Dexamethasone  Airway Management Planned: Oral ETT  Additional Equipment:   Intra-op Plan:   Post-operative Plan: Extubation in OR  Informed Consent: I have reviewed the patients History and Physical, chart, labs and discussed the procedure including the risks, benefits and alternatives for the proposed anesthesia with the patient or authorized representative who has indicated his/her understanding and acceptance.     Dental advisory given  Plan Discussed with: CRNA and Surgeon  Anesthesia Plan Comments:        Anesthesia Quick Evaluation

## 2023-03-12 NOTE — Anesthesia Procedure Notes (Signed)
Anesthesia Regional Block: Adductor canal block   Pre-Anesthetic Checklist: , timeout performed,  Correct Patient, Correct Site, Correct Laterality,  Correct Procedure, Correct Position, site marked,  Risks and benefits discussed,  Surgical consent,  Pre-op evaluation,  At surgeon's request and post-op pain management  Laterality: Right and Lower  Prep: chloraprep       Needles:  Injection technique: Single-shot  Needle Type: Stimulator Needle - 80     Needle Length: 8.3cm  Needle Gauge: 22   Needle insertion depth: 6 cm   Additional Needles:   Procedures:,,,, ultrasound used (permanent image in chart),,    Narrative:  Start time: 03/12/2023 8:50 AM End time: 03/12/2023 9:00 AM Injection made incrementally with aspirations every 5 mL.  Performed by: Personally  Anesthesiologist: Denese Killings, MD  Additional Notes: BP cuff, EKG monitors applied. Sedation begun. After nerve location anesthetic injected incrementally, slowly , and after neg aspirations. Tolerated well.

## 2023-03-12 NOTE — Interval H&P Note (Signed)
History and Physical Interval Note:  03/12/2023 8:57 AM  Nicole Cobb  has presented today for surgery, with the diagnosis of Right bimalleolar ankle fracture.  The various methods of treatment have been discussed with the patient and family. After consideration of risks, benefits and other options for treatment, the patient has consented to  Procedure(s) with comments: OPEN REDUCTION INTERNAL FIXATION (ORIF) ANKLE FRACTURE (Right) - 9 am start requested as a surgical intervention.  The patient's history has been reviewed, patient examined, no change in status, stable for surgery.  I have reviewed the patient's chart and labs.  Questions were answered to the patient's satisfaction.     [name]

## 2023-03-12 NOTE — Anesthesia Procedure Notes (Signed)
Procedure Name: Intubation Date/Time: 03/12/2023 9:18 AM  Performed by: Hewitt Blade, CRNAPre-anesthesia Checklist: Patient identified, Emergency Drugs available, Suction available and Patient being monitored Patient Re-evaluated:Patient Re-evaluated prior to induction Oxygen Delivery Method: Circle system utilized Preoxygenation: Pre-oxygenation with 100% oxygen Induction Type: IV induction and Cricoid Pressure applied Ventilation: Mask ventilation without difficulty Laryngoscope Size: Glidescope Grade View: Grade I Tube type: Oral Tube size: 6.5 mm Number of attempts: 1 Airway Equipment and Method: Rigid stylet Placement Confirmation: ETT inserted through vocal cords under direct vision, positive ETCO2 and breath sounds checked- equal and bilateral Secured at: 21 cm Tube secured with: Tape Dental Injury: Teeth and Oropharynx as per pre-operative assessment

## 2023-03-12 NOTE — Progress Notes (Signed)
Assisted Dr.Battula with right, popliteal, saphenous block. Side rails up, monitors on throughout procedure. See vital signs in flow sheet. Tolerated Procedure well. Timeout for the blocks was performed at 0836, verifying patient name,DOB,procedure name, Doctors name. Popliteal block used 20cc of medicine. Saphenous block used 15cc of medicine.  Pictures taken of ultrasound screen, placed in chart.

## 2023-03-12 NOTE — Discharge Instructions (Signed)
Kyo Cocuzza A. Amedeo Kinsman, MD [facility] [address] [place],  [place]  [address] Phone: [phone] Fax: [phone]   [place] Please keep splint clean dry and intact until followup.  You may shower on Post-Op Day #2.  You must keep splint dry during this process and may find that a plastic bag taped around the leg or alternatively a towel based bath may be a better option.   If you get your splint wet or if it is damaged please contact our clinic.  EXERCISES Due to your splint being in place you will not be able to bear weight through your extremity.   DO NOT PUT ANY WEIGHT ON YOUR OPERATIVE LEG Please use crutches or a walker to avoid weight bearing.   REGIONAL ANESTHESIA (NERVE BLOCKS) The anesthesia team may have performed a nerve block for you if safe in the setting of your care.  This is a great tool used to minimize pain.  Typically the block may start wearing off overnight but the long acting medicine may last for 3-4 days.  The nerve block wearing off can be a challenging period but please utilize your as needed pain medications to try and manage this period.    POST-OP MEDICATIONS- Multimodal approach to pain control  In general your pain will be controlled with a combination of substances.  Prescriptions unless otherwise discussed are electronically sent to your pharmacy.  This is a carefully made plan we use to minimize narcotic use.     - Celebrex - Anti-inflammatory medication taken on a scheduled basis  - Acetaminophen - Non-narcotic pain medicine taken on a scheduled basis   - Tramadol - This is a narcotic medicine, to be used only on an "as needed" basis for pain.  -  Aspirin 81mg  - This medicine is used to minimize the risk of blood clots after surgery.             -          Zofran - take as needed for nausea   FOLLOW-UP If you develop a Fever (>101.5), Redness or Drainage from the  surgical incision site, please call our office to arrange for an evaluation. Please call the office to schedule a follow-up appointment for your incision check if you do not already have one, 10-14 days post-operatively.  IF YOU HAVE ANY QUESTIONS, PLEASE FEEL FREE TO CALL OUR OFFICE.  HELPFUL INFORMATION  If you had a block, it will wear off between 8-24 hrs postop typically.  This is period when your pain may go from nearly zero to the pain you would have had postop without the block.  This is an abrupt transition but nothing dangerous is happening.  You may take an extra dose of narcotic when this happens.  You should wean off your narcotic medicines as soon as you are able.  Most patients will be off or using minimal narcotics before their first postop appointment.   Elevating your leg will help with swelling and pain control.  You are encouraged to elevate your leg as much as possible in the first couple of weeks following surgery.  Imagine a drop of water on your toe, and your goal is to get that water back to your heart.  We suggest you use the pain medication the first night prior to going to bed, in order to ease any pain when the anesthesia wears off. You should  should avoid taking pain medications on an empty stomach as it will make you nauseous.  Do not drink alcoholic beverages or take illicit drugs when taking pain medications.  In most states it is against the law to drive while you are in a splint or sling.  And certainly against the law to drive while taking narcotics.  You may return to work/school in the next couple of days when you feel up to it.   Pain medication may make you constipated.  Below are a few solutions to try in this order: Decrease the amount of pain medication if you aren't having pain. Drink lots of decaffeinated fluids. Drink prune juice and/or each dried prunes  If the first 3 don't work start with additional solutions Take Colace - an over-the-counter stool  softener Take Senokot - an over-the-counter laxative Take Miralax - a stronger over-the-counter laxative  

## 2023-03-12 NOTE — Transfer of Care (Signed)
Immediate Anesthesia Transfer of Care Note  Patient: Nicole Cobb  Procedure(s) Performed: OPEN REDUCTION INTERNAL FIXATION (ORIF) ANKLE FRACTURE (Right: Ankle)  Patient Location: PACU  Anesthesia Type:General  Level of Consciousness: awake, alert , and oriented  Airway & Oxygen Therapy: Patient Spontanous Breathing and Patient connected to face mask oxygen  Post-op Assessment: Report given to RN and Post -op Vital signs reviewed and stable  Post vital signs: Reviewed and stable  Last Vitals:  Vitals Value Taken Time  BP 129/61 03/12/23 1102  Temp    Pulse 80 03/12/23 1111  Resp 15 03/12/23 1111  SpO2 99 % 03/12/23 1111  Vitals shown include unvalidated device data.  Last Pain:  Vitals:   03/12/23 0758  PainSc: 0-No pain         Complications: No notable events documented.

## 2023-03-12 NOTE — Anesthesia Postprocedure Evaluation (Signed)
Anesthesia Post Note  Patient: Nicole Cobb  Procedure(s) Performed: OPEN REDUCTION INTERNAL FIXATION (ORIF) ANKLE FRACTURE (Right: Ankle)  Patient location during evaluation: Phase II Anesthesia Type: General Level of consciousness: awake and alert and oriented Pain management: pain level controlled Vital Signs Assessment: post-procedure vital signs reviewed and stable Respiratory status: spontaneous breathing, nonlabored ventilation and respiratory function stable Cardiovascular status: blood pressure returned to baseline and stable Postop Assessment: no apparent nausea or vomiting Anesthetic complications: no  No notable events documented.   Last Vitals:  Vitals:   03/12/23 1223 03/12/23 1231  BP: (!) 112/58 130/62  Pulse: 80 75  Resp: (!) 21   Temp:  36.9 C  SpO2: 93% 98%    Last Pain:  Vitals:   03/12/23 1231  TempSrc: Oral  PainSc: 0-No pain                 [name] C Meyer Dockery

## 2023-03-12 NOTE — Op Note (Signed)
Orthopaedic Surgery Operative Note (CSN: [id number])  [place]  03/16/1941 Date of Surgery: 03/12/2023   Diagnoses:  Right bimalleolar ankle fracture  Procedure: ORIF of right distal fibula fracture and medial malleolus fracture   Operative Finding Successful completion of the planned procedure.  Operative fixation of a comminuted distal fibula fracture with a plate and screw construct, and percutaneous fixation of the medial malleolus fracture fragment.  She had multiple fracture blisters, especially over the medial aspect of the ankle.  We did not make incisions through these blisters.  She was placed in a short leg splint.   Post-Op Diagnosis: Same Surgeons:Primary: [name], MD Assistants: Franklyn Lor Location: [facility] OR ROOM 4 Anesthesia: General with regional anesthesia Antibiotics: Ancef 2 g Tourniquet time:  Total Tourniquet Time Documented: Thigh (Right) - 66 minutes Total: Thigh (Right) - 66 minutes  Estimated Blood Loss: 50 cc Complications: None Specimens: None Implants: Implant Name Type Inv. Item Serial No. Manufacturer Lot No. LRB No. Used Action  SCREW LOW PROFILE 3.5X16 - [id number] Screw SCREW LOW PROFILE 3.5X16  [employer] STERILE ON SET Right 1 Implanted  SCREW LOW PROFILE 3.5X14 - [id number] Screw SCREW LOW PROFILE 3.5X14  [employer] STERILE ON SET Right 2 Implanted  SCREW COMP KREULOCK 2.7X14 - [id number] Screw SCREW COMP KREULOCK 2.7X14  [employer] STERILE ON SET Right 4 Implanted  SCREW COMP KREULOCK 2.7X12 - [id number] Screw SCREW COMP KREULOCK 2.7X12  [employer] STERILE ON SET Right 1 Implanted  DISTAL LOCKING PLATE 8 HOLE, RIGHT    [employer] STERILE ON SET Right 1 Implanted  SCREW CANN 4.0X50 - [id number] Screw SCREW CANN 4.0X50  [employer] STERILE ON SET Right 2 Implanted    Indications for Surgery:   Nicole Cobb is a 82 y.o. female who fell and sustained a right bimalleolar ankle fracture.  Ankle was stabilized in the emergency  department.  She presented to clinic for follow-up evaluation.  Given the nature of her injury, I recommended operative fixation.  Benefits and risks of operative and nonoperative management were discussed prior to surgery with the patient and informed consent form was completed.  Specific risks including infection, need for additional surgery, bleeding, damage to surrounding structures, nonunion, malunion, stiffness, persistent pain and more severe complications associate with anesthesia.  She like to proceed.  Surgical consent was finalized.   Procedure:   The patient was identified properly. Informed consent was obtained and the surgical site was marked. The patient was taken to the OR where general anesthesia was induced.  The patient was positioned supine, with her leg on bone foam.  The right leg was prepped and draped in the usual sterile fashion.  Timeout was performed before the beginning of the case.  Tourniquet was used for the above duration.  She received Ancef prior to making incision.  We started on the lateral aspect of the ankle.  We made a longitudinal incision directly over the shaft of the fibula, centered over the fracture site.  This was confirmed under fluoroscopy.  We dissected sharply down to the lateral surface of the fibula.  We then dissected bluntly, to clear off the fracture.  Fracture callus was appreciated.  This was cleaned with a curette, rongeurs and irrigation.  With the assistance of fluoroscopy, we then reduced the ankle, ensuring that we could achieve adequate length to restore the ankle stability.  The above-stated plate was selected from the back table.  This was placed provisionally.  Placement and  reduction was confirmed under fluoroscopy.  We then proceeded to place multiple screws in the shaft, securing the plate to the lateral surface of the fibula.  Following this, we placed multiple locking screws in the distal cluster of the fibula.  These were unicortical.   Overall, she had poor bone quality.  We used multiple locking screws distally.  We then placed another screw in the proximal shaft of the fibula.  Fluoroscopic imaging, including orthogonal views confirmed appropriate placement of the plate, as well as adequate reduction of the fracture.  The shaft of the fibula did have decent bone quality overall.  We then turned our attention to the medial aspect of the ankle.  There were multiple fracture blisters over the medial malleolus.  The most proximal blister had been decompressed.  The most distal blisters along the glabrous border of the skin were hemorrhagic.  Once the distal fibula was secured, the ankle was reasonably reduced overall.  As result, I made the decision to proceed with percutaneous placement of partially-threaded cannulated screws.  K wires introduced distal to the tip of the medial malleolus.  Using fluoroscopy, we introduced the K wire in retrograde fashion, ensuring that the K wire across the fracture, but did not breach the joint.  A second K wire was then placed.  Once were satisfied with the reduction, as well as the overall positioning, we estimated the length of our screws.  We drilled the distal cortex over the K wire.  We then placed 2 partially-threaded cannulated screws in retrograde fashion, and achieving adequate bite.  The fracture was reduced appropriately.  Final fluoroscopic images demonstrated excellent reduction of the fracture, with appropriate placement of hardware.  There were no complications.  We irrigated the wound copiously.  We closed the incision in a multilayer fashion with absorbable suture and 3-0 nylon.  Sterile dressing was placed.  Patient was awoken taken to PACU in stable condition.   Post-operative plan:  The patient will be NWB on the operative extremity Discharge home from the PACU once they have recovered DVT prophylaxis -she will resume Plavix on postop day #1. Pain control with PRN pain medication  preferring oral medicines.   Follow up plan will be scheduled in approximately 10-14 days for incision check and XR.

## 2023-03-12 NOTE — Anesthesia Procedure Notes (Signed)
Anesthesia Regional Block: Popliteal block   Pre-Anesthetic Checklist: , timeout performed,  Correct Patient, Correct Site, Correct Laterality,  Correct Procedure, Correct Position, site marked,  Risks and benefits discussed,  Surgical consent,  Pre-op evaluation,  At surgeon's request and post-op pain management  Laterality: Right  Prep: chloraprep       Needles:  Injection technique: Single-shot  Needle Type: Stimulator Needle - 80     Needle Length: 8.3cm  Needle Gauge: 22   Needle insertion depth: 6 cm   Additional Needles:   Procedures:,,,, ultrasound used (permanent image in chart),,    Narrative:  Start time: 03/12/2023 8:40 AM End time: 03/12/2023 8:45 AM Injection made incrementally with aspirations every 5 mL.  Performed by: Personally  Anesthesiologist: Denese Killings, MD  Additional Notes: BP cuff, EKG monitors applied. Sedation begun. After nerve location anesthetic injected incrementally, slowly , and after neg aspirations. Tolerated well.

## 2023-03-13 ENCOUNTER — Telehealth: Payer: Self-pay | Admitting: Orthopedic Surgery

## 2023-03-13 NOTE — Telephone Encounter (Signed)
Dr. Amedeo Kinsman patient - the patient's daughter Abigail Butts [phone] called to schedule the patient's postop appointment.  She couldn't do any day I offered, scheduled for 03/27/23, is this ok?  Also, she stated that the patient snored all night last night and is a little congested.  She knows Dr. Amedeo Kinsman is off all next week and she would like for him to call a low dose antibiotic in for her and they will not use it unless absolutely necessary.  She uses [facility].

## 2023-03-18 NOTE — Telephone Encounter (Signed)
Called to check on pt and pass on information from provider. Pt states she's doing really well and able to get around a lot better. Is sleeping through the night and no longer taking pain meds. Gave office # incase of any questions or concerns before follow up on 4/5.

## 2023-03-24 ENCOUNTER — Encounter (HOSPITAL_COMMUNITY): Payer: Self-pay | Admitting: Orthopedic Surgery

## 2023-03-27 ENCOUNTER — Ambulatory Visit (INDEPENDENT_AMBULATORY_CARE_PROVIDER_SITE_OTHER): Payer: Medicare Other | Admitting: Orthopedic Surgery

## 2023-03-27 ENCOUNTER — Other Ambulatory Visit (INDEPENDENT_AMBULATORY_CARE_PROVIDER_SITE_OTHER): Payer: Medicare Other

## 2023-03-27 ENCOUNTER — Encounter: Payer: Self-pay | Admitting: Orthopedic Surgery

## 2023-03-27 DIAGNOSIS — S82841D Displaced bimalleolar fracture of right lower leg, subsequent encounter for closed fracture with routine healing: Secondary | ICD-10-CM | POA: Diagnosis not present

## 2023-03-27 DIAGNOSIS — S82841S Displaced bimalleolar fracture of right lower leg, sequela: Secondary | ICD-10-CM | POA: Diagnosis not present

## 2023-03-27 MED ORDER — TRAMADOL HCL 50 MG PO TABS: 50.0000 mg | ORAL_TABLET | Freq: Four times a day (QID) | ORAL | 0 refills | Status: DC | PRN

## 2023-03-27 NOTE — Progress Notes (Signed)
Orthopaedic Postop Note  Assessment: Nicole Cobb is a 82 y.o. female s/p ORIF of Right ankle fracture  DOS: 03/12/2023  Plan: Sutures removed, steri strips placed Well padded short leg cast placed in clinic today NWB on the operative extremity Continue to take Aspirin 81 mg BID Refill provided for tramadol  Cast application - Right short leg cast   Verbal consent was obtained and the correct extremity was identified. A well padded, appropriately molded short leg cast was applied to the Right leg Fingers remained warm and well perfused.   There were no sharp edges Patient tolerated the procedure well Cast care instructions were provided    Meds ordered this encounter  Medications   traMADol (ULTRAM) 50 MG tablet    Sig: Take 1 tablet (50 mg total) by mouth every 6 (six) hours as needed.    Dispense:  30 tablet    Refill:  0     Follow-up: Return in about 2 weeks (around 04/10/2023). XR at next visit: Right ankle  Subjective:  Chief Complaint  Patient presents with   Routine Post Op    R ankle DOS 03/12/23    History of Present Illness: Nicole Cobb is a 82 y.o. female who presents following the above stated procedure.  She has done well.  She has not been bearing weight.  She is taking the tramadol, Tylenol and Mobic for pain.  She is requesting a refill on the tramadol.  No numbness or tingling.  Review of Systems: No fevers or chills No numbness or tingling No Chest Pain No shortness of breath   Objective: There were no vitals taken for this visit.  Physical Exam:  Alert and oriented.  No acute distress.  Seated in a wheelchair  Blisters over the dorsum of the foot, as well as the medial ankle are healing.  They are dry.  No concern for full-thickness ulceration.  Surgical incisions of the medial ankle, as well as the lateral ankle are healing.  No surrounding erythema or drainage.  She has intact motion in the EHL/TA.  Sensation intact to the  dorsum of the foot.  She tolerates gentle range of motion of the ankle, and he had to a plantigrade position.  IMAGING: I personally ordered and reviewed the following images  X-rays of the right ankle were obtained in clinic today.  No acute injuries are noted.  Well-positioned lateral plate, without evidence of failure.  Screws remain in good position.  There are no backing out.  The mortise is intact.  Medial malleolus screws are in good position.  They are not backing out.  Fractures are still visible, without interval displacement.  Impression: Healing right bimalleolar ankle fracture, without hardware failure.   Oliver Barre, MD 03/27/2023 10:00 AM

## 2023-03-27 NOTE — Patient Instructions (Signed)

## 2023-04-10 ENCOUNTER — Encounter: Payer: Self-pay | Admitting: Orthopedic Surgery

## 2023-04-10 ENCOUNTER — Ambulatory Visit (INDEPENDENT_AMBULATORY_CARE_PROVIDER_SITE_OTHER): Payer: Medicare Other | Admitting: Orthopedic Surgery

## 2023-04-10 ENCOUNTER — Other Ambulatory Visit (INDEPENDENT_AMBULATORY_CARE_PROVIDER_SITE_OTHER): Payer: Medicare Other

## 2023-04-10 DIAGNOSIS — S82841D Displaced bimalleolar fracture of right lower leg, subsequent encounter for closed fracture with routine healing: Secondary | ICD-10-CM

## 2023-04-10 NOTE — Progress Notes (Signed)
Orthopaedic Postop Note  Assessment: Nicole Cobb is a 82 y.o. female s/p ORIF of Right ankle fracture  DOS: 03/12/2023  Plan: Nicole Cobb continues to do well.  Tylenol and Motrin are providing sufficient relief.  The blisters and skin issues are healing appropriately.  Radiographs are stable. Well padded short leg cast placed in clinic today NWB on the operative extremity Continue to take Aspirin 81 mg BID Refill provided for tramadol  Cast application - Right short leg cast   Verbal consent was obtained and the correct extremity was identified. A well padded, appropriately molded short leg cast was applied to the Right leg Fingers remained warm and well perfused.   There were no sharp edges Patient tolerated the procedure well Cast care instructions were provided    Follow-up: Return in about 2 weeks (around 04/24/2023). XR at next visit: Right ankle  Subjective:  Chief Complaint  Patient presents with   Routine Post Op    S/p ORIF RT ankle DOS 03/12/23 W/ xray   Follow-up    History of Present Illness: Nicole Cobb is a 82 y.o. female who presents following the above stated procedure.  Surgery was approximately 4 weeks ago.  She is doing well.  Occasional tramadol for pain.  She has not put any weight on her cast.  She is tolerating the cast well.  No numbness or tingling.   Review of Systems: No fevers or chills No numbness or tingling No Chest Pain No shortness of breath   Objective: There were no vitals taken for this visit.  Physical Exam:  Alert and oriented.  No acute distress.  Seated in a wheelchair  Right foot with diffuse swelling.  Blisters are dried medially.  Small ulceration on the dorsum of her foot appears healthy.  No drainage.  Surgical incision laterally is healing very well.  Mild residual scabbing.  She tolerates gentle range of motion.  Sensation is intact over the dorsum of the foot.  Active dorsiflexion of the ankle and the  great toe.  IMAGING: I personally ordered and reviewed the following images  X-rays of the right ankle were obtained in clinic today.  These are compared to available x-rays.  Plate and screw construct laterally remains intact.  There has been interval consolidation of the fracture of the fibula.  Screws are not backing out.  Overall alignment remains unchanged.  Medial screws have not changed in their position.  The mortise is congruent.  No syndesmotic disruption.  Impression: Healing right ankle fracture without hardware failure.  Oliver Barre, MD 04/10/2023 9:43 AM

## 2023-04-10 NOTE — Patient Instructions (Signed)

## 2023-04-24 ENCOUNTER — Other Ambulatory Visit (INDEPENDENT_AMBULATORY_CARE_PROVIDER_SITE_OTHER): Payer: Medicare Other

## 2023-04-24 ENCOUNTER — Ambulatory Visit (INDEPENDENT_AMBULATORY_CARE_PROVIDER_SITE_OTHER): Payer: Medicare Other | Admitting: Orthopedic Surgery

## 2023-04-24 ENCOUNTER — Encounter: Payer: Self-pay | Admitting: Orthopedic Surgery

## 2023-04-24 DIAGNOSIS — S82841D Displaced bimalleolar fracture of right lower leg, subsequent encounter for closed fracture with routine healing: Secondary | ICD-10-CM

## 2023-04-24 NOTE — Patient Instructions (Signed)
Instructions ° °1.  You have sustained an ankle sprain, or similar exercises that can be treated as an ankle sprain.  **These exercises can also be used as part of recovery from an ankle fracture.  °2.  I encourage you to stay on your feet and gradually remove your walking boot.   °3.  Below are some exercises that you can complete on your own to improve your symptoms.  °4.  As an alternative, you can search for ankle sprain exercises online, and can see some demonstrations on YouTube  °5.  If you are having difficulty with these exercises, we can also prescribe formal physical therapy ° °Ankle Exercises °Ask your health care provider which exercises are safe for you. Do exercises exactly as told by your health care provider and adjust them as directed. It is normal to feel mild stretching, pulling, tightness, or mild discomfort as you do these exercises. Stop right away if you feel sudden pain or your pain gets worse. Do not begin these exercises until told by your health care provider. ° °Stretching and range-of-motion exercises °These exercises warm up your muscles and joints and improve the movement and flexibility of your ankle. These exercises may also help to relieve pain. ° °Dorsiflexion/plantar flexion ° °Sit with your R knee straight or bent. Do not rest your foot on anything. °Flex your left ankle to tilt the top of your foot toward your shin. This is called dorsiflexion. °Hold this position for 5 seconds. °Point your toes downward to tilt the top of your foot away from your shin. This is called plantar flexion. °Hold this position for 5 seconds. °Repeat 10 times. Complete this exercise 2-3 times a day.  As tolerated ° °Ankle alphabet ° °Sit with your R foot supported at your lower leg. °Do not rest your foot on anything. °Make sure your foot has room to move freely. °Think of your R foot as a paintbrush: °Move your foot to trace each letter of the alphabet in the air. Keep your hip and knee still while  you trace the letters. Trace every letter from A to Z. °Make the letters as large as you can without causing or increasing any discomfort. ° °Repeat 2-3 times. Complete this exercise 2-3 times a day. ° ° °Strengthening exercises °These exercises build strength and endurance in your ankle. Endurance is the ability to use your muscles for a long time, even after they get tired. °Dorsiflexors °These are muscles that lift your foot up. °Secure a rubber exercise band or tube to an object, such as a table leg, that will stay still when the band is pulled. Secure the other end around your R foot. °Sit on the floor, facing the object with your R leg extended. The band or tube should be slightly tense when your foot is relaxed. °Slowly flex your R ankle and toes to bring your foot toward your shin. °Hold this position for 5 seconds. °Slowly return your foot to the starting position, controlling the band as you do that. °Repeat 10 times. Complete this exercise 2-3 times a day. ° °Plantar flexors °These are muscles that push your foot down. °Sit on the floor with your R leg extended. °Loop a rubber exercise band or tube around the ball of your R foot. The ball of your foot is on the walking surface, right under your toes. The band or tube should be slightly tense when your foot is relaxed. °Slowly point your toes downward, pushing them away from   you. °Hold this position for 5 seconds. °Slowly release the tension in the band or tube, controlling smoothly until your foot is back in the starting position. °Repeat 10 times. Complete this exercise 2-3 times a day. ° °Towel curls ° °Sit in a chair on a non-carpeted surface, and put your feet on the floor. °Place a towel in front of your feet. °Keeping your heel on the floor, put your R foot on the towel. °Pull the towel toward you by grabbing the towel with your toes and curling them under. Keep your heel on the floor. °Let your toes relax. °Grab the towel again. Keep pulling the  towel until it is completely underneath your foot. °Repeat 10 times. Complete this exercise 2-3 times a day. ° °Standing plantar flexion °This is an exercise in which you use your toes to lift your body's weight while standing. °Stand with your feet shoulder-width apart. °Keep your weight spread evenly over the width of your feet while you rise up on your toes. Use a wall or table to steady yourself if needed, but try not to use it for support. °If this exercise is too easy, try these options: °Shift your weight toward your R leg until you feel challenged. °If told by your health care provider, lift your uninjured leg off the floor. °Hold this position for 5 seconds. °Repeat 10 times. Complete this exercise 2-3 times a day. ° °Tandem walking °Stand with one foot directly in front of the other. °Slowly raise your back foot up, lifting your heel before your toes, and place it directly in front of your other foot. °Continue to walk in this heel-to-toe way. Have a countertop or wall nearby to use if needed to keep your balance, but try not to hold onto anything for support. ° °Repeat 10 times. Complete this exercise 2-3 times a day. ° ° °Document Revised: 09/04/2018 Document Reviewed: 09/06/2018 °[employer] Patient Education © 2020 [employer] ° °

## 2023-04-24 NOTE — Progress Notes (Signed)
Orthopaedic Postop Note  Assessment: Nicole Cobb is a 82 y.o. female s/p ORIF of Right ankle fracture  DOS: 03/12/2023  Plan: [name] is progressing appropriately.  She has no pain.  Some residual swelling.  Incisions have healed.  Radiographs are stable.  Toes are warm and well-perfused.  We will transition to a walking boot.  Exercises have been provided.  She has developed a tight heel cord.  Stressed stretching of the Achilles.  Weightbearing as tolerated in the boot, using a walker.  Follow-up in a month.   Follow-up: Return in about 4 weeks (around 05/22/2023). XR at next visit: Right ankle  Subjective:  Chief Complaint  Patient presents with   Routine Post Op    R ankle DOS 03/12/23    History of Present Illness: Nicole Cobb is a 82 y.o. female who presents following the above stated procedure.  Surgery was approximately 6 weeks ago.  She denies pain.  No numbness or tingling.  She has remained off of her right leg.  No weightbearing thus far.  Review of Systems: No fevers or chills No numbness or tingling No Chest Pain No shortness of breath   Objective: There were no vitals taken for this visit.  Physical Exam:  Alert and oriented.  No acute distress.  Seated in a wheelchair  Right foot and ankle continues to have diffuse swelling.  Incisions are healing well.  No surrounding redness or drainage.  Blisters medially have all healed.  There is some dry skin.  She is just short of plantigrade position with the leg extended.  Toes are warm and well-perfused.  Sensation intact over the dorsum of her foot.  IMAGING: I personally ordered and reviewed the following images  X-rays of the right ankle were obtained in clinic today.  These were compared to prior x-rays.  There are no new injuries.  Hardware in the distal fibula remains stable.  No screws are backing out.  There has been interval consolidation of the fracture.  Medially, screws remain in a good  position.  There does appear to be a small fragment which has displaced medially, but this is stable compared to prior x-rays.  Fracture line remains visible medially.  Mortise is congruent.  No syndesmotic disruption.  Impression: Healing right bimalleolar ankle fracture, without hardware failure.  Oliver Barre, MD 04/24/2023 11:29 AM

## 2023-05-22 ENCOUNTER — Other Ambulatory Visit (INDEPENDENT_AMBULATORY_CARE_PROVIDER_SITE_OTHER): Payer: Medicare Other

## 2023-05-22 ENCOUNTER — Encounter: Payer: Self-pay | Admitting: Orthopedic Surgery

## 2023-05-22 ENCOUNTER — Ambulatory Visit (INDEPENDENT_AMBULATORY_CARE_PROVIDER_SITE_OTHER): Payer: Medicare Other | Admitting: Orthopedic Surgery

## 2023-05-22 DIAGNOSIS — S82841D Displaced bimalleolar fracture of right lower leg, subsequent encounter for closed fracture with routine healing: Secondary | ICD-10-CM

## 2023-05-22 NOTE — Progress Notes (Signed)
Orthopaedic Postop Note  Assessment: Nicole Cobb is a 82 y.o. female s/p ORIF of Right ankle fracture  DOS: 03/12/2023  Plan: Mrs. Nicole Cobb is doing well.  Pain is controlled.  She is not taking medicines on a regular basis.  Radiographs are stable.  She needs to continue focusing on Achilles tendon stretching, and transition out of the walking boot.  Some exercises were demonstrated in clinic today.  Okay to stop using the walker.  Continue with home exercises, consider physical therapy if not getting better.  I would like to see her back in 6 weeks.   Follow-up: Return in about 6 weeks (around 07/03/2023). XR at next visit: Right ankle  Subjective:  Chief Complaint  Patient presents with   Routine Post Op    R ankle DOS 03/12/23    History of Present Illness: Nicole Cobb is a 82 y.o. female who presents following the above stated procedure.  Surgery was approximately 10 weeks ago.  Occasional pain medial and lateral to the ankle.  She is doing exercises on her own.  She continues to use a walking boot, as well as a walker.  She is not taking medicines on a regular basis.  Incisions have healed.   Review of Systems: No fevers or chills No numbness or tingling No Chest Pain No shortness of breath   Objective: There were no vitals taken for this visit.  Physical Exam:  Alert and oriented.  No acute distress.  Ambulating well in a walking boot, as well as the walker  Right ankle with mild diffuse swelling.  Surgical incisions are healed.  No surrounding erythema or drainage.  Medial blisters have completely healed.  Point tenderness over the medial ankle.  No redness in this area.  Some point tenderness laterally.  She gets to a plantigrade position.  Toes warm and well-perfused.  Sensation intact over the dorsum of the foot.  IMAGING: I personally ordered and reviewed the following images  X-rays of the right ankle were obtained in clinic today.  These are  compared to prior x-rays.  Hardware in the medial and lateral malleoli remain intact.  No evidence of screws backing out.  There is been no interval displacement.  Mortise is congruent.  No syndesmotic disruption.  Fracture line remains visible medially, unchanged from prior x-rays.  Impression: Healed right bimalleolar ankle fracture following operative fixation. Oliver Barre, MD 05/22/2023 11:39 AM

## 2023-07-03 ENCOUNTER — Other Ambulatory Visit: Payer: Self-pay

## 2023-07-03 ENCOUNTER — Ambulatory Visit: Payer: Medicare Other | Admitting: Orthopedic Surgery

## 2023-07-03 ENCOUNTER — Encounter: Payer: Self-pay | Admitting: Orthopedic Surgery

## 2023-07-03 DIAGNOSIS — S82841D Displaced bimalleolar fracture of right lower leg, subsequent encounter for closed fracture with routine healing: Secondary | ICD-10-CM

## 2023-07-03 NOTE — Progress Notes (Signed)
Orthopaedic Postop Note  Assessment: Nicole Cobb is a 82 y.o. female s/p ORIF of Right ankle fracture  DOS: 03/12/2023  Plan: [name] has continued to improve.  She is taking Tylenol occasionally.  She is ambulating in a regular shoe.  Occasional swelling and discoloration of the foot, but this is expected.  Provided reassurance.  Elevate the foot to help with swelling.  Continue to walk to help with strength and motion around the ankle.  Recommend use of a cane.  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  She does not have any pain, do not need to address this with another surgery.  She states her understanding.  She is comfortable returning to clinic as needed.   Follow-up: No follow-ups on file. XR at next visit: Right ankle  Subjective:  Chief Complaint  Patient presents with   Follow-up    History of Present Illness: Nicole Cobb is a 82 y.o. female who presents following the above stated procedure.  Surgery was almost 4 months ago.  She is walking in a regular shoe.  She does note occasional swelling.  This improves when she gets off her feet.  She takes Tylenol, but not very often.  She has noticed occasional discoloration in the foot.     Review of Systems: No fevers or chills No numbness or tingling No Chest Pain No shortness of breath   Objective: There were no vitals taken for this visit.  Physical Exam:  Alert and oriented.  No acute distress.  Ambulating well in a regular shoe.  Right ankle surgical incisions have healed, as have the blistering.  Mild diffuse swelling.  No tenderness to palpation medial or lateral.  Easily gets to a plantigrade position.  Toes are warm and well-perfused.  IMAGING: I personally ordered and reviewed the following images  X-rays of the right ankle were obtained in clinic today.  These are compared to available x-rays.  Plate and screw construct lateral remains in good position.   No evidence of backing out.  Hardware remains intact.  Screws remain intact in the medial malleolus.  Fracture line remains visible, without additional healing at this time.  Mortise is congruent.  No syndesmotic disruption.  Impression: Healed right bimalleolar ankle fracture, with fibrous nonunion of medial malleolus   Oliver Barre, MD 07/03/2023 10:03 AM

## 2023-08-25 DIAGNOSIS — H43813 Vitreous degeneration, bilateral: Secondary | ICD-10-CM | POA: Diagnosis not present

## 2023-08-25 DIAGNOSIS — Z961 Presence of intraocular lens: Secondary | ICD-10-CM | POA: Diagnosis not present

## 2023-08-25 DIAGNOSIS — H02834 Dermatochalasis of left upper eyelid: Secondary | ICD-10-CM | POA: Diagnosis not present

## 2023-08-25 DIAGNOSIS — I639 Cerebral infarction, unspecified: Secondary | ICD-10-CM | POA: Diagnosis not present

## 2023-08-25 DIAGNOSIS — H02831 Dermatochalasis of right upper eyelid: Secondary | ICD-10-CM | POA: Diagnosis not present

## 2023-11-20 ENCOUNTER — Ambulatory Visit
Admission: EM | Admit: 2023-11-20 | Discharge: 2023-11-20 | Disposition: A | Discharge status: Home / Self Care | Payer: Medicare Other | Attending: Family Medicine | Admitting: Family Medicine

## 2023-11-20 ENCOUNTER — Ambulatory Visit: Payer: Medicare Other

## 2023-11-20 DIAGNOSIS — R509 Fever, unspecified: Secondary | ICD-10-CM | POA: Diagnosis not present

## 2023-11-20 DIAGNOSIS — K449 Diaphragmatic hernia without obstruction or gangrene: Secondary | ICD-10-CM | POA: Diagnosis not present

## 2023-11-20 DIAGNOSIS — J189 Pneumonia, unspecified organism: Secondary | ICD-10-CM

## 2023-11-20 DIAGNOSIS — R058 Other specified cough: Secondary | ICD-10-CM | POA: Diagnosis not present

## 2023-11-20 LAB — POC COVID19/FLU A&B COMBO
Covid Antigen, POC: NEGATIVE
Influenza A Antigen, POC: NEGATIVE
Influenza B Antigen, POC: NEGATIVE

## 2023-11-20 MED ORDER — BENZONATATE 100 MG PO CAPS: 100.0000 mg | ORAL_CAPSULE | Freq: Three times a day (TID) | ORAL | 0 refills | Status: DC

## 2023-11-20 MED ORDER — DOXYCYCLINE HYCLATE 100 MG PO CAPS: 100.0000 mg | ORAL_CAPSULE | Freq: Two times a day (BID) | ORAL | 0 refills | Status: DC

## 2023-11-20 NOTE — Discharge Instructions (Signed)
Take the full course of antibiotics, and I have sent over a cough medicine to help as needed.  You may also take plain Mucinex, Coricidin HBP, Flonase and use humidifiers, hot teas and other home remedies

## 2023-11-20 NOTE — ED Provider Notes (Signed)
[facility]    CSN: [id number] Arrival date & time: 11/20/23  0807      History   Chief Complaint Chief Complaint  Patient presents with   Cough    HPI Nicole Cobb is a 82 y.o. female.   Patient presenting today with 2-day history of fever, chills, sweats, body aches, productive cough, congestion, chest discomfort with coughing, mild shortness of breath.  Denies abdominal pain, nausea, vomiting, diarrhea, sore throat.  So far tried Mucinex and Sudafed with minimal relief.  No known history of chronic pulmonary disease.  No known sick contacts recently.    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   Acute ischemic stroke (HCC) 01/20/2022   Headache 01/19/2022   TIA (transient ischemic attack)    Dysphasia 01/06/2015   HTN (hypertension), benign 07/16/2014   Hyponatremia 07/15/2014   Nausea and vomiting 07/15/2014   Gastroenteritis 07/15/2014   Hypokalemia 07/15/2014   Elevated LFTs 07/15/2014   Generalized weakness 07/15/2014   Vertigo 09/01/2013   Nausea with vomiting 09/01/2013   Essential hypertension, benign 09/01/2013   Anxiety state 09/01/2013    Past Surgical History:  Procedure Laterality Date   CATARACT EXTRACTION Right    ORIF ANKLE FRACTURE Right 03/12/2023   Procedure: OPEN REDUCTION INTERNAL FIXATION (ORIF) ANKLE FRACTURE;  Surgeon: Oliver Barre, MD;  Location: [facility];  Service: Orthopedics;  Laterality: Right;  9 am start requested   TUBAL LIGATION      OB History   No obstetric history on file.      Home Medications    Prior to Admission medications    Medication Sig Start Date End Date Taking? Authorizing Provider  benzonatate (TESSALON) 100 MG capsule Take 1 capsule (100 mg total) by mouth every 8 (eight) hours. 11/20/23  Yes Particia Nearing, PA-C  doxycycline (VIBRAMYCIN) 100 MG capsule Take 1 capsule (100 mg total) by mouth 2 (two) times daily. 11/20/23  Yes Particia Nearing, PA-C  acetaminophen (TYLENOL) 500 MG tablet Take 500 mg by mouth every 6 (six) hours as needed.    [provider]  alendronate (FOSAMAX) 70 MG tablet Take 70 mg by mouth once a week. 10/30/21   [provider]  ALPRAZolam Prudy Feeler) 0.5 MG tablet Take 1 tablet (0.5 mg total) by mouth 2 (two) times daily as needed for sleep or anxiety. Patient taking differently: Take 0.5 mg by mouth 2 (two) times daily. 09/02/13   Erick Blinks, MD  atenolol (TENORMIN) 50 MG tablet Take 1 tablet (50 mg total) by mouth daily. 07/17/14   Elliot Cousin, MD  calcium carbonate (OS-CAL - DOSED IN MG OF ELEMENTAL CALCIUM) 1250 (500 Ca) MG tablet Take 4 tablets by mouth daily with breakfast.    [provider]  cetirizine (ZYRTEC) 10 MG tablet Take 10 mg by mouth daily as needed for allergies.     [provider]  Cholecalciferol (D3-1000) 25 MCG (1000 UT) tablet Take 2 tablets (2,000 Units total) by mouth daily. 01/21/22   Catarina Hartshorn, MD  clopidogrel (PLAVIX) 75 MG  tablet Take 1 tablet (75 mg total) by mouth daily. 01/22/22   Catarina Hartshorn, MD  co-enzyme Q-10 50 MG capsule Take 200 mg by mouth at bedtime.     [provider]  DULoxetine (CYMBALTA) 30 MG capsule Take 1 capsule (30 mg total) by mouth daily. 01/12/23   Everlena Cooper, Adam R, DO  fluticasone (FLONASE) 50 MCG/ACT nasal spray Place 1 spray into both nostrils daily as needed.     [provider]  iron polysaccharides (NIFEREX) 150 MG capsule Take 150 mg by mouth daily.    [provider]  lactose free nutrition (BOOST) LIQD Take 237 mLs by mouth 3 (three) times daily between  meals.    [provider]  losartan (COZAAR) 50 MG tablet Take 50 mg by mouth daily. 11/18/22   [provider]  Lutein 20 MG CAPS Take 20 mg by mouth daily.    [provider]  omeprazole (PRILOSEC) 20 MG capsule Take 20 mg by mouth daily.    [provider]  pravastatin (PRAVACHOL) 40 MG tablet Take 40 mg by mouth daily. 03/13/22   [provider]  traMADol (ULTRAM) 50 MG tablet Take 1 tablet (50 mg total) by mouth every 6 (six) hours as needed. 03/10/23   Oliver Barre, MD  traMADol (ULTRAM) 50 MG tablet Take 1 tablet (50 mg total) by mouth every 6 (six) hours as needed. 03/27/23   Oliver Barre, MD  vitamin B-12 (CYANOCOBALAMIN) 500 MCG tablet Take 1,000 mcg by mouth.    [provider]  Zinc Sulfate (ZINC 15 PO) Take by mouth.    [provider]  zinc sulfate 220 (50 Zn) MG capsule Take 220 mg by mouth daily.    [provider]    Family History Family History  Problem Relation Age of Onset   Diabetes Mother    Cancer Mother        breast    Heart failure Mother    Cancer Father        lymphoma   Cancer Sister    Migraines Sister    Heart failure Brother    Heart attack Brother    Cancer Brother        stomach    Social History Social History   Tobacco Use   Smoking status: Never   Smokeless tobacco: Never  Substance Use Topics   Alcohol use: No    Alcohol/week: 0.0 standard drinks of alcohol   Drug use: No     Allergies   Patient has no known allergies.   Review of Systems Review of Systems Per HPI  Physical Exam Triage Vital Signs ED Triage Vitals  Encounter Vitals Group     BP 11/20/23 0814 (!) 163/78     Systolic BP Percentile --      Diastolic BP Percentile --      Pulse Rate 11/20/23 0814 86     Resp 11/20/23 0814 18     Temp 11/20/23 0814 99.2 F (37.3 C)     Temp Source 11/20/23 0814 Oral     SpO2 11/20/23 0814 96 %     Weight --      Height --      Head Circumference --       Peak Flow --      Pain Score 11/20/23 0815 7     Pain Loc --      Pain Education --      Exclude from [employer]  Chart --    No data found.  Updated Vital Signs BP (!) 163/78 (BP Location: Right Arm)   Pulse 86   Temp 99.2 F (37.3 C) (Oral)   Resp 18   SpO2 96%   Visual Acuity Right Eye Distance:   Left Eye Distance:   Bilateral Distance:    Right Eye Near:   Left Eye Near:    Bilateral Near:     Physical Exam Vitals and nursing note reviewed.  Constitutional:      Appearance: Normal appearance.  HENT:     Head: Atraumatic.     Right Ear: Tympanic membrane and external ear normal.     Left Ear: Tympanic membrane and external ear normal.     Nose: Rhinorrhea present.     Mouth/Throat:     Mouth: Mucous membranes are moist.     Pharynx: Posterior oropharyngeal erythema present.  Eyes:     Extraocular Movements: Extraocular movements intact.     Conjunctiva/sclera: Conjunctivae normal.  Cardiovascular:     Rate and Rhythm: Normal rate and regular rhythm.     Heart sounds: Normal heart sounds.  Pulmonary:     Effort: Pulmonary effort is normal.     Breath sounds: Normal breath sounds. No wheezing or rales.  Musculoskeletal:        General: Normal range of motion.     Cervical back: Normal range of motion and neck supple.  Skin:    General: Skin is warm and dry.  Neurological:     Mental Status: She is alert and oriented to person, place, and time.  Psychiatric:        Mood and Affect: Mood normal.        Thought Content: Thought content normal.      UC Treatments / Results  Labs (all labs ordered are listed, but only abnormal results are displayed) Labs Reviewed  POC COVID19/FLU A&B COMBO    EKG   Radiology DG Chest 2 View  Result Date: 11/20/2023 CLINICAL DATA:  Productive cough, fever, body aches EXAM: CHEST - 2 VIEW COMPARISON:  01/19/2022 FINDINGS: Frontal and lateral views of the chest demonstrate an unremarkable cardiac silhouette. There  is right upper lobe consolidation abutting the minor fissure, concerning for bronchopneumonia. No effusion or pneumothorax. Small hiatal hernia. No acute bony abnormalities. IMPRESSION: 1. Patchy right upper lobe bronchopneumonia, abutting the minor fissure. 2. Small hiatal hernia. Electronically Signed   By: Sharlet Salina M.D.   On: 11/20/2023 08:59    Procedures Procedures (including critical care time)  Medications Ordered in UC Medications - No data to display  Initial Impression / Assessment and Plan / UC Course  I have reviewed the triage vital signs and the nursing notes.  Pertinent labs & imaging results that were available during my care of the patient were reviewed by me and considered in my medical decision making (see chart for details).     Mildly hypertensive in triage and otherwise vital signs within normal limits.  She is well-appearing and in no acute distress.  COVID and flu testing negative in clinic today, chest x-ray showing a patchy right upper lobe pneumonia.  Will treat with doxycycline, Tessalon, supportive over-the-counter medications and home care.  Return for worsening symptoms.  Final Clinical Impressions(s) / UC Diagnoses   Final diagnoses:  Pneumonia of right upper lobe due to infectious organism     Discharge Instructions      Take the full course of antibiotics, and I have  sent over a cough medicine to help as needed.  You may also take plain Mucinex, Coricidin HBP, Flonase and use humidifiers, hot teas and other home remedies    ED Prescriptions     Medication Sig Dispense Auth. Provider   doxycycline (VIBRAMYCIN) 100 MG capsule Take 1 capsule (100 mg total) by mouth 2 (two) times daily. 14 capsule Particia Nearing, PA-C   benzonatate (TESSALON) 100 MG capsule Take 1 capsule (100 mg total) by mouth every 8 (eight) hours. 21 capsule Particia Nearing, [place]      PDMP not reviewed this encounter.   Roosvelt Maser [place],  [place] 11/20/23 [phone]

## 2023-11-20 NOTE — ED Triage Notes (Signed)
Pt reports she has a cough that causes chest discomfort, body aches, and a fever x 2 days

## 2024-01-10 ENCOUNTER — Other Ambulatory Visit: Payer: Self-pay | Admitting: Neurology

## 2024-01-13 ENCOUNTER — Ambulatory Visit: Payer: Medicare Other | Admitting: Neurology

## 2024-01-13 ENCOUNTER — Encounter: Payer: Self-pay | Admitting: Neurology

## 2024-02-05 DIAGNOSIS — E559 Vitamin D deficiency, unspecified: Secondary | ICD-10-CM | POA: Diagnosis not present

## 2024-02-05 DIAGNOSIS — Z0001 Encounter for general adult medical examination with abnormal findings: Secondary | ICD-10-CM | POA: Diagnosis not present

## 2024-02-05 DIAGNOSIS — G43909 Migraine, unspecified, not intractable, without status migrainosus: Secondary | ICD-10-CM | POA: Diagnosis not present

## 2024-02-05 DIAGNOSIS — E039 Hypothyroidism, unspecified: Secondary | ICD-10-CM | POA: Diagnosis not present

## 2024-02-05 DIAGNOSIS — K219 Gastro-esophageal reflux disease without esophagitis: Secondary | ICD-10-CM | POA: Diagnosis not present

## 2024-02-05 DIAGNOSIS — D518 Other vitamin B12 deficiency anemias: Secondary | ICD-10-CM | POA: Diagnosis not present

## 2024-02-05 DIAGNOSIS — I69398 Other sequelae of cerebral infarction: Secondary | ICD-10-CM | POA: Diagnosis not present

## 2024-02-05 DIAGNOSIS — G9332 Myalgic encephalomyelitis/chronic fatigue syndrome: Secondary | ICD-10-CM | POA: Diagnosis not present

## 2024-02-05 DIAGNOSIS — I10 Essential (primary) hypertension: Secondary | ICD-10-CM | POA: Diagnosis not present

## 2024-02-22 ENCOUNTER — Ambulatory Visit: Payer: Medicare Other | Admitting: Neurology

## 2024-02-29 NOTE — Progress Notes (Unsigned)
 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 Hypertension Hyperlipidemia      1  For occipital neuralgia, duloxetine 30mg  daily 2.  Secondary stroke prevention management as per PCP:             - Plavix 75mg  daily -  statin.  LDL goal less than 70             - Normotensive blood pressure             - Hgb A1c goal less than 7 6  Mediterranean diet 7  Routine exercise 8  Follow up one year for continued management of occipital neuralgia.    Subjective:  Nicole Cobb is a 83 year old right-handed female with HTN, HLD and history of BPPV who follows up for stroke.  She is accompanied by her daughter.   UPDATE: Current medications:  duloxetine 30mg  daily  Occipital neuralgia controlled on duloxetine.  No recurrence.    HISTORY: Patient presented to [facility] on 01/19/2022 for posterior and right frontal headache with confusion and difficulty following commands.  She did not exhibit any lateralizing symptoms.  On exam, there was concern for left-sided hemianopsia.  CT head showed no acute abnormalities but MRI of brain demonstrated 13 mm acute cortical/subcortical infarct within the right parietal lobe at junction of right MCA and PCA vascular territories.  Did not receive tPA due to outside therapeutic window.  CTA of head and neck was negative for LVO or hemodynamically significant stenosis.  2D echocardiogram showed EF 60-65% with no PFO or other cardiac source of embolus.  LDL was 82 and Hgb A1c was 5.4.  Her pravastatin was changed to atorvastatin.  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  Atorvastatin was increased.  Prior to admission, she was  taking Plavix.  She was discharged on ASA 325mg  and Plavix 75mg  daily for 21 days followed by ASA 325mg  daily alone.  30 day cardiac event monitor in February-March evealed NSR with no evidence of a fib or other significant arrhythmia or heart block.   Following the stroke, she had occipital neuralgia.  Started duloxetine.  It has resolved.    01/20/2022 MRI BRAIN WO:  13 mm acute cortical/subcortical infarct within the right parietal lobe (at the junction of the right MCA and PCA vascular territories). Background mild chronic small vessel ischemic changes within the cerebral white matter. 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 01/19/2022 CTA HEAD & NECK:  No emergent large vessel occlusion or high-grade stenosis of the intracranial arteries. No infarct by CT perfusion.Bilateral carotid bifurcation atherosclerosis without hemodynamically significant stenosis. Narrowing of the origin of the left vertebral artery.  PAST MEDICAL HISTORY: Past Medical History:  Diagnosis Date   Anemia    Anxiety    BPV (benign positional vertigo)    Hiatal hernia    HTN (hypertension)    Hyperlipidemia    Hypokalemia    Stroke [facility])    Vertigo     MEDICATIONS: Current Outpatient Medications on File Prior to Visit  Medication Sig Dispense Refill   acetaminophen (TYLENOL) 500 MG tablet Take 500 mg by mouth every 6 (six) hours as needed.     alendronate (FOSAMAX)  70 MG tablet Take 70 mg by mouth once a week.     ALPRAZolam (XANAX) 0.5 MG tablet Take 1 tablet (0.5 mg total) by mouth 2 (two) times daily as needed for sleep or anxiety. (Patient taking differently: Take 0.5 mg by mouth 2 (two) times daily.) 30 tablet 0   atenolol (TENORMIN) 50 MG tablet Take 1 tablet (50 mg total) by mouth daily. 30 tablet 3   benzonatate (TESSALON) 100 MG capsule Take 1 capsule (100 mg total) by mouth every 8 (eight) hours. 21 capsule 0   calcium carbonate (OS-CAL - DOSED IN MG OF  ELEMENTAL CALCIUM) 1250 (500 Ca) MG tablet Take 4 tablets by mouth daily with breakfast.     cetirizine (ZYRTEC) 10 MG tablet Take 10 mg by mouth daily as needed for allergies.      Cholecalciferol (D3-1000) 25 MCG (1000 UT) tablet Take 2 tablets (2,000 Units total) by mouth daily.     clopidogrel (PLAVIX) 75 MG tablet Take 1 tablet (75 mg total) by mouth daily. 20 tablet 0   co-enzyme Q-10 50 MG capsule Take 200 mg by mouth at bedtime.      doxycycline (VIBRAMYCIN) 100 MG capsule Take 1 capsule (100 mg total) by mouth 2 (two) times daily. 14 capsule 0   DULoxetine (CYMBALTA) 30 MG capsule TAKE 1 CAPSULE BY MOUTH DAILY. 30 capsule 2   fluticasone (FLONASE) 50 MCG/ACT nasal spray Place 1 spray into both nostrils daily as needed.      iron polysaccharides (NIFEREX) 150 MG capsule Take 150 mg by mouth daily.     lactose free nutrition (BOOST) LIQD Take 237 mLs by mouth 3 (three) times daily between meals.     losartan (COZAAR) 50 MG tablet Take 50 mg by mouth daily.     Lutein 20 MG CAPS Take 20 mg by mouth daily.     omeprazole (PRILOSEC) 20 MG capsule Take 20 mg by mouth daily.     pravastatin (PRAVACHOL) 40 MG tablet Take 40 mg by mouth daily.     traMADol (ULTRAM) 50 MG tablet Take 1 tablet (50 mg total) by mouth every 6 (six) hours as needed. 20 tablet 0   traMADol (ULTRAM) 50 MG tablet Take 1 tablet (50 mg total) by mouth every 6 (six) hours as needed. 30 tablet 0   vitamin B-12 (CYANOCOBALAMIN) 500 MCG tablet Take 1,000 mcg by mouth.     Zinc Sulfate (ZINC 15 PO) Take by mouth.     zinc sulfate 220 (50 Zn) MG capsule Take 220 mg by mouth daily.     No current facility-administered medications on file prior to visit.    ALLERGIES: No Known Allergies  FAMILY HISTORY: Family History  Problem Relation Age of Onset   Diabetes Mother    Cancer Mother        breast    Heart failure Mother    Cancer Father        lymphoma   Cancer Sister    Migraines Sister    Heart failure Brother     Heart attack Brother    Cancer Brother        stomach      Objective:  Blood pressure (!) 158/76, pulse 74, height 5\' 4"  (1.626 m), weight 168 lb (76.2 kg), SpO2 99%. General: No acute distress.  Patient appears well-groomed.   Head:  Normocephalic/atraumatic Neck:  Supple.  No paraspinal tenderness.  Full range of motion. Heart:  Regular rate and rhythm. Neuro:  Alert and oriented.  Speech fluent and not dysarthric.  Language intact.  CN II-XII intact.  Bulk and tone normal.  Muscle strength 5/5 throughout.  Sensation intact.  Deep tendon reflexes 3+ patellars otherwise 2+ throughout.  Gait normal.  Romberg negative.   Shon Millet, DO  CC: Nicole Nevins, MD

## 2024-03-01 ENCOUNTER — Ambulatory Visit: Payer: Medicare Other | Admitting: Neurology

## 2024-03-01 ENCOUNTER — Encounter: Payer: Self-pay | Admitting: Neurology

## 2024-03-01 VITALS — BP 158/76 | HR 74 | Ht 64.0 in | Wt 168.0 lb

## 2024-03-01 DIAGNOSIS — M5481 Occipital neuralgia: Secondary | ICD-10-CM | POA: Diagnosis not present

## 2024-03-01 DIAGNOSIS — E785 Hyperlipidemia, unspecified: Secondary | ICD-10-CM

## 2024-03-01 DIAGNOSIS — I10 Essential (primary) hypertension: Secondary | ICD-10-CM

## 2024-03-01 DIAGNOSIS — Z8673 Personal history of transient ischemic attack (TIA), and cerebral infarction without residual deficits: Secondary | ICD-10-CM | POA: Diagnosis not present

## 2024-03-01 MED ORDER — DULOXETINE HCL 30 MG PO CPEP: 30.0000 mg | ORAL_CAPSULE | Freq: Every day | ORAL | 3 refills | Status: AC

## 2024-03-01 NOTE — Patient Instructions (Signed)
 Duloxetine 30 mg daily

## 2024-04-11 ENCOUNTER — Ambulatory Visit
Admission: EM | Admit: 2024-04-11 | Discharge: 2024-04-11 | Disposition: A | Discharge status: Home / Self Care | Attending: Nurse Practitioner | Admitting: Nurse Practitioner

## 2024-04-11 DIAGNOSIS — J069 Acute upper respiratory infection, unspecified: Secondary | ICD-10-CM | POA: Diagnosis not present

## 2024-04-11 DIAGNOSIS — R062 Wheezing: Secondary | ICD-10-CM

## 2024-04-11 LAB — POC SARS CORONAVIRUS 2 AG -  ED: SARS Coronavirus 2 Ag: NEGATIVE

## 2024-04-11 MED ORDER — CETIRIZINE HCL 10 MG PO TABS: 10.0000 mg | ORAL_TABLET | Freq: Every day | ORAL | 0 refills | Status: AC

## 2024-04-11 MED ORDER — PREDNISONE 20 MG PO TABS: 40.0000 mg | ORAL_TABLET | Freq: Every day | ORAL | 0 refills | Status: AC

## 2024-04-11 MED ORDER — BENZONATATE 100 MG PO CAPS
100.0000 mg | ORAL_CAPSULE | Freq: Three times a day (TID) | ORAL | 0 refills | Status: AC | PRN
Start: 2024-04-11 — End: ?

## 2024-04-11 MED ORDER — ALBUTEROL SULFATE HFA 108 (90 BASE) MCG/ACT IN AERS
2.0000 | INHALATION_SPRAY | Freq: Four times a day (QID) | RESPIRATORY_TRACT | 0 refills | Status: AC | PRN
Start: 2024-04-11 — End: ?

## 2024-04-11 MED ORDER — METHYLPREDNISOLONE SODIUM SUCC 125 MG IJ SOLR
125.0000 mg | Freq: Once | INTRAMUSCULAR | Status: AC
  Administered 2024-04-11: 125 mg via INTRAMUSCULAR

## 2024-04-11 NOTE — ED Triage Notes (Signed)
 Cough, congestion, itchy throat, wheezing x 4 days. Taking mucinex, tylenol  and Flonase.

## 2024-04-11 NOTE — Discharge Instructions (Addendum)
 The COVID test was negative. Take medication as prescribed.  Continue Flonase, Mucinex and Tylenol  as needed. May use normal saline nasal spray throughout the day for nasal congestion and runny nose. Recommend use of a humidifier in your bedroom at nighttime during sleep and sleeping elevated on pillows while cough symptoms persist. If your symptoms do not improve over the next several days, or begin to worsen, you may follow-up in this clinic or with your primary care physician for further evaluation. Follow-up as needed.

## 2024-04-11 NOTE — ED Provider Notes (Addendum)
 [facility]    CSN: [id number] Arrival date & time: 04/11/24  0801      History   Chief Complaint Chief Complaint  Patient presents with   Cough    HPI Nicole Cobb is a 83 y.o. female.   The history is provided by the patient.   Patient presents with a 4-day history of nasal congestion, chest congestion, scratchy throat, and wheezing.  Patient denies fever, chills, headache, ear pain, runny nose, difficulty breathing, chest pain, abdominal pain, nausea, vomiting, diarrhea, or rash.  Patient states she is concerned for COVID.  Patient states she has been taking over-the-counter Mucinex, Tylenol , and Flonase with minimal relief.  Patient denies any obvious known sick contacts.  Patient reports prior to her symptoms starting, she did smell gas fumes in her home, which ended up being her propane tank that need to be changed.    Past Medical History:  Diagnosis Date   Anemia    Anxiety    BPV (benign positional vertigo)    Hiatal hernia    HTN (hypertension)    Hyperlipidemia    Hypokalemia    Stroke [facility])    Vertigo     Patient Active Problem List   Diagnosis Date Noted   Leukocytosis 01/20/2022   Hyperlipidemia, unspecified 01/20/2022   GERD (gastroesophageal reflux disease) 01/20/2022   Complicated migraine 01/20/2022   Acute ischemic stroke (HCC) 01/20/2022   Headache 01/19/2022   TIA (transient ischemic attack)    Dysphasia 01/06/2015   HTN (hypertension), benign 07/16/2014   Hyponatremia 07/15/2014   Nausea and vomiting 07/15/2014   Gastroenteritis 07/15/2014   Hypokalemia 07/15/2014   Elevated LFTs 07/15/2014   Generalized weakness 07/15/2014   Vertigo 09/01/2013   Nausea with vomiting 09/01/2013   Essential hypertension, benign 09/01/2013   Anxiety state 09/01/2013    Past Surgical History:  Procedure Laterality Date   CATARACT EXTRACTION Right    ORIF ANKLE FRACTURE Right 03/12/2023   Procedure: OPEN REDUCTION INTERNAL FIXATION (ORIF)  ANKLE FRACTURE;  Surgeon: Tonita Frater, MD;  Location: [facility];  Service: Orthopedics;  Laterality: Right;  9 am start requested   TUBAL LIGATION      OB History   No obstetric history on file.      Home Medications    Prior to Admission medications   Medication Sig Start Date End Date Taking? Authorizing Provider  acetaminophen  (TYLENOL ) 500 MG tablet Take 500 mg by mouth every 6 (six) hours as needed.   Yes [provider]  albuterol  (VENTOLIN  HFA) 108 (90 Base) MCG/ACT inhaler Inhale 2 puffs into the lungs every 6 (six) hours as needed. 04/11/24  Yes Leath-Warren, Belen Bowers, NP  alendronate (FOSAMAX) 70 MG tablet Take 70 mg by mouth once a week. 10/30/21  Yes [provider]  ALPRAZolam  (XANAX ) 0.5 MG tablet Take 1 tablet (0.5 mg total) by mouth 2 (two) times daily as needed for sleep or anxiety. Patient taking differently: Take 0.5 mg by mouth 2 (two) times daily. 09/02/13  Yes Gwendalyn Lemma, MD  atenolol  (TENORMIN ) 50 MG tablet Take 1 tablet (50 mg total) by mouth daily. 07/17/14  Yes Benedetto Brady, MD  benzonatate  (TESSALON ) 100 MG capsule Take 1 capsule (100 mg total) by mouth 3 (three) times daily as needed for cough. 04/11/24  Yes Leath-Warren, Belen Bowers, NP  cetirizine  (ZYRTEC ) 10 MG tablet Take 1 tablet (10 mg total) by mouth daily. 04/11/24  Yes Leath-Warren, Belen Bowers, NP  clopidogrel  (PLAVIX ) 75  MG tablet Take 1 tablet (75 mg total) by mouth daily. 01/22/22  Yes Tat, Myrtie Atkinson, MD  DULoxetine  (CYMBALTA ) 30 MG capsule Take 1 capsule (30 mg total) by mouth daily. 03/01/24  Yes Jaffe, Adam R, DO  losartan (COZAAR) 50 MG tablet Take 50 mg by mouth daily. 11/18/22  Yes [provider]  Lutein 20 MG CAPS Take 20 mg by mouth daily.   Yes [provider]  pantoprazole  (PROTONIX ) 40 MG tablet Take 40 mg by mouth daily. 02/05/24  Yes [provider]  pravastatin  (PRAVACHOL ) 40 MG tablet Take 40 mg by mouth daily. 03/13/22  Yes [provider]  predniSONE  (DELTASONE ) 20 MG tablet Take 2 tablets (40 mg total) by mouth daily with breakfast for 5 days. 04/11/24 04/16/24 Yes Leath-Warren, Belen Bowers, NP  vitamin B-12 (CYANOCOBALAMIN ) 500 MCG tablet Take 1,000 mcg by mouth.   Yes [provider]  Zinc Sulfate (ZINC 15 PO) Take by mouth.   Yes [provider]  calcium  carbonate (OS-CAL - DOSED IN MG OF ELEMENTAL CALCIUM ) 1250 (500 Ca) MG tablet Take 4 tablets by mouth daily with breakfast.    [provider]  Cholecalciferol (D3-1000) 25 MCG (1000 UT) tablet Take 2 tablets (2,000 Units total) by mouth daily. 01/21/22   Demaris Fillers, MD  co-enzyme Q-10 50 MG capsule Take 200 mg by mouth at bedtime.     [provider]  doxycycline  (VIBRAMYCIN ) 100 MG capsule Take 1 capsule (100 mg total) by mouth 2 (two) times daily. 11/20/23   Corbin Dess, PA-C  fluticasone (FLONASE) 50 MCG/ACT nasal spray Place 1 spray into both nostrils daily as needed.     [provider]  iron polysaccharides (NIFEREX) 150 MG capsule Take 150 mg by mouth daily. Patient not taking: Reported on 03/01/2024    [provider]  lactose free nutrition (BOOST) LIQD Take 237 mLs by mouth 3 (three) times daily between meals. Patient not taking: Reported on 03/01/2024    [provider]  traMADol  (ULTRAM ) 50 MG tablet Take 1 tablet (50 mg total) by mouth every 6 (six) hours as needed. Patient not taking: Reported on 03/01/2024 03/10/23   Tonita Frater, MD  traMADol  (ULTRAM ) 50 MG tablet Take 1 tablet (50 mg total) by mouth every 6 (six) hours as needed. Patient not taking: Reported on 03/01/2024 03/27/23   Tonita Frater, MD  zinc sulfate 220 (50 Zn) MG capsule Take 220 mg by mouth daily.    [provider]    Family History Family History  Problem Relation Age of Onset   Diabetes Mother    Cancer Mother        breast    Heart failure Mother    Cancer Father        lymphoma   Cancer Sister     Migraines Sister    Heart failure Brother    Heart attack Brother    Cancer Brother        stomach    Social History Social History   Tobacco Use   Smoking status: Never   Smokeless tobacco: Never  Substance Use Topics   Alcohol use: No    Alcohol/week: 0.0 standard drinks of alcohol   Drug use: No     Allergies   Patient has no known allergies.   Review of Systems Review of Systems Per HPI  Physical Exam Triage Vital Signs ED Triage Vitals  Encounter Vitals Group     BP  04/11/24 0828 (!) 162/87     Systolic BP Percentile --      Diastolic BP Percentile --      Pulse Rate 04/11/24 0828 69     Resp 04/11/24 0828 18     Temp 04/11/24 0828 97.7 F (36.5 C)     Temp Source 04/11/24 0828 Oral     SpO2 04/11/24 0828 95 %     Weight --      Height --      Head Circumference --      Peak Flow --      Pain Score 04/11/24 0829 0     Pain Loc --      Pain Education --      Exclude from Growth Chart --    No data found.  Updated Vital Signs BP (!) 162/87 (BP Location: Right Arm)   Pulse 69   Temp 97.7 F (36.5 C) (Oral)   Resp 18   SpO2 95%   Visual Acuity Right Eye Distance:   Left Eye Distance:   Bilateral Distance:    Right Eye Near:   Left Eye Near:    Bilateral Near:     Physical Exam Vitals and nursing note reviewed.  Constitutional:      General: She is not in acute distress.    Appearance: Normal appearance.  HENT:     Head: Normocephalic.     Right Ear: Tympanic membrane, ear canal and external ear normal.     Left Ear: Tympanic membrane, ear canal and external ear normal.     Nose: Congestion present.     Mouth/Throat:     Lips: Pink.     Mouth: Mucous membranes are moist.     Pharynx: Uvula midline. Postnasal drip present. No pharyngeal swelling, oropharyngeal exudate, posterior oropharyngeal erythema or uvula swelling.     Comments: Cobblestoning present to posterior oropharynx  Eyes:     General: Lids are normal.     Extraocular  Movements: Extraocular movements intact.     Conjunctiva/sclera: Conjunctivae normal.     Pupils: Pupils are equal, round, and reactive to light.  Cardiovascular:     Rate and Rhythm: Normal rate and regular rhythm.     Pulses: Normal pulses.     Heart sounds: Normal heart sounds.  Pulmonary:     Effort: Pulmonary effort is normal. No respiratory distress.     Breath sounds: No stridor. Examination of the right-lower field reveals wheezing. Examination of the left-lower field reveals wheezing. Wheezing present. No rhonchi or rales.     Comments: Faint expiratory wheezing noted in the posterior bilateral lower lobes. Abdominal:     General: Bowel sounds are normal.     Palpations: Abdomen is soft.     Tenderness: There is no abdominal tenderness.  Musculoskeletal:     Cervical back: Normal range of motion.  Lymphadenopathy:     Cervical: No cervical adenopathy.  Skin:    General: Skin is warm and dry.  Neurological:     General: No focal deficit present.     Mental Status: She is alert and oriented to person, place, and time.  Psychiatric:        Mood and Affect: Mood normal.        Behavior: Behavior normal.      UC Treatments / Results  Labs (all labs ordered are listed, but only abnormal results are displayed) Labs Reviewed  POC SARS CORONAVIRUS 2 AG -  ED  EKG   Radiology No results found.  Procedures Procedures (including critical care time)  Medications Ordered in UC Medications  methylPREDNISolone  sodium succinate (SOLU-MEDROL ) 125 mg/2 mL injection 125 mg (125 mg Intramuscular Given 04/11/24 0915)    Initial Impression / Assessment and Plan / UC Course  I have reviewed the triage vital signs and the nursing notes.  Pertinent labs & imaging results that were available during my care of the patient were reviewed by me and considered in my medical decision making (see chart for details).  The COVID test was negative.  Symptoms consistent with a viral URI  with cough.  For patient's wheezing, Solu-Medrol  125 mg IM administered. Will start prednisone  40 mg for the next 5 days, along with an albuterol  inhaler.  Will have patient continue fluticasone 50 mcg nasal spray.  Will also start cetirizine  10 mg to act as an antihistamine, along with Tessalon  100 mg for cough.  Supportive care recommendations were provided and discussed with the patient to include fluids, rest, continuing over-the-counter Tylenol , sleeping elevated, and use of a humidifier during bedtime.  Discussed indications with patient regarding follow-up.  Patient was in agreement with this plan of care and verbalized understanding.  All questions were answered.  Patient stable for discharge.   Final Clinical Impressions(s) / UC Diagnoses   Final diagnoses:  Viral URI with cough  Wheezing     Discharge Instructions      The COVID test was negative. Take medication as prescribed.  Continue Flonase, Mucinex and Tylenol  as needed. May use normal saline nasal spray throughout the day for nasal congestion and runny nose. Recommend use of a humidifier in your bedroom at nighttime during sleep and sleeping elevated on pillows while cough symptoms persist. If your symptoms do not improve over the next several days, or begin to worsen, you may follow-up in this clinic or with your primary care physician for further evaluation. Follow-up as needed.     ED Prescriptions     Medication Sig Dispense Auth. Provider   predniSONE  (DELTASONE ) 20 MG tablet Take 2 tablets (40 mg total) by mouth daily with breakfast for 5 days. 10 tablet Leath-Warren, Belen Bowers, NP   cetirizine  (ZYRTEC ) 10 MG tablet Take 1 tablet (10 mg total) by mouth daily. 30 tablet Leath-Warren, Belen Bowers, NP   benzonatate  (TESSALON ) 100 MG capsule Take 1 capsule (100 mg total) by mouth 3 (three) times daily as needed for cough. 30 capsule Leath-Warren, Belen Bowers, NP   albuterol  (VENTOLIN  HFA) 108 (90 Base) MCG/ACT inhaler  Inhale 2 puffs into the lungs every 6 (six) hours as needed. 8 g Leath-Warren, Belen Bowers, NP      PDMP not reviewed this encounter.   Hardy Lia, NP 04/11/24 0901    Hardy Lia, NP 04/11/24 [phone]

## 2024-09-12 DIAGNOSIS — H43813 Vitreous degeneration, bilateral: Secondary | ICD-10-CM | POA: Diagnosis not present

## 2024-09-12 DIAGNOSIS — H02831 Dermatochalasis of right upper eyelid: Secondary | ICD-10-CM | POA: Diagnosis not present

## 2024-09-12 DIAGNOSIS — H02834 Dermatochalasis of left upper eyelid: Secondary | ICD-10-CM | POA: Diagnosis not present

## 2024-12-07 ENCOUNTER — Ambulatory Visit

## 2024-12-07 VITALS — BP 170/80 | HR 87 | Ht 64.0 in | Wt 175.1 lb

## 2024-12-07 DIAGNOSIS — Z6830 Body mass index (BMI) 30.0-30.9, adult: Secondary | ICD-10-CM | POA: Diagnosis not present

## 2024-12-07 DIAGNOSIS — I10 Essential (primary) hypertension: Secondary | ICD-10-CM | POA: Diagnosis not present

## 2024-12-07 DIAGNOSIS — E782 Mixed hyperlipidemia: Secondary | ICD-10-CM

## 2024-12-07 DIAGNOSIS — M5481 Occipital neuralgia: Secondary | ICD-10-CM | POA: Diagnosis not present

## 2024-12-07 DIAGNOSIS — I639 Cerebral infarction, unspecified: Secondary | ICD-10-CM

## 2024-12-07 DIAGNOSIS — I6930 Unspecified sequelae of cerebral infarction: Secondary | ICD-10-CM | POA: Diagnosis not present

## 2024-12-07 DIAGNOSIS — F411 Generalized anxiety disorder: Secondary | ICD-10-CM | POA: Diagnosis not present

## 2024-12-07 DIAGNOSIS — M810 Age-related osteoporosis without current pathological fracture: Secondary | ICD-10-CM

## 2024-12-07 DIAGNOSIS — K219 Gastro-esophageal reflux disease without esophagitis: Secondary | ICD-10-CM

## 2024-12-07 MED ORDER — ATENOLOL 50 MG PO TABS: 50.0000 mg | ORAL_TABLET | Freq: Every day | ORAL | 3 refills | Status: AC

## 2024-12-07 MED ORDER — ALPRAZOLAM 0.5 MG PO TABS: 0.5000 mg | ORAL_TABLET | Freq: Two times a day (BID) | ORAL | 0 refills | Status: AC | PRN

## 2024-12-07 MED ORDER — PANTOPRAZOLE SODIUM 40 MG PO TBEC: 40.0000 mg | DELAYED_RELEASE_TABLET | Freq: Every day | ORAL | 3 refills | Status: AC

## 2024-12-07 MED ORDER — LOSARTAN POTASSIUM 50 MG PO TABS: 50.0000 mg | ORAL_TABLET | Freq: Every day | ORAL | 3 refills | Status: AC

## 2024-12-07 MED ORDER — ALENDRONATE SODIUM 70 MG PO TABS: 70.0000 mg | ORAL_TABLET | ORAL | 3 refills | Status: AC

## 2024-12-07 MED ORDER — PRAVASTATIN SODIUM 40 MG PO TABS: 40.0000 mg | ORAL_TABLET | Freq: Every day | ORAL | 3 refills | Status: AC

## 2024-12-07 MED ORDER — CLOPIDOGREL BISULFATE 75 MG PO TABS: 75.0000 mg | ORAL_TABLET | Freq: Every day | ORAL | 3 refills | Status: AC

## 2024-12-07 NOTE — Progress Notes (Unsigned)
 New Patient Office Visit  Subjective    Patient ID: Nicole Cobb, female    DOB: 1941/06/03  Age: 83 y.o. MRN: [id number]  CC:  Chief Complaint  Patient presents with   Establish Care    Establish care    HPI Nicole Cobb presents to establish care   Outpatient Encounter Medications as of 12/07/2024  Medication Sig   acetaminophen  (TYLENOL ) 500 MG tablet Take 500 mg by mouth every 6 (six) hours as needed.   calcium  carbonate (OS-CAL - DOSED IN MG OF ELEMENTAL CALCIUM ) 1250 (500 Ca) MG tablet Take 4 tablets by mouth daily with breakfast.   cetirizine  (ZYRTEC ) 10 MG tablet Take 1 tablet (10 mg total) by mouth daily.   Cholecalciferol (D3-1000) 25 MCG (1000 UT) tablet Take 2 tablets (2,000 Units total) by mouth daily.   co-enzyme Q-10 50 MG capsule Take 200 mg by mouth at bedtime.    DULoxetine  (CYMBALTA ) 30 MG capsule Take 1 capsule (30 mg total) by mouth daily.   fluticasone (FLONASE) 50 MCG/ACT nasal spray Place 1 spray into both nostrils daily as needed.    lactose free nutrition (BOOST) LIQD Take 237 mLs by mouth 3 (three) times daily between meals.   Lutein 20 MG CAPS Take 20 mg by mouth daily.   vitamin B-12 (CYANOCOBALAMIN ) 500 MCG tablet Take 1,000 mcg by mouth.   [DISCONTINUED] alendronate  (FOSAMAX ) 70 MG tablet Take 70 mg by mouth once a week.   [DISCONTINUED] ALPRAZolam  (XANAX ) 0.5 MG tablet Take 1 tablet (0.5 mg total) by mouth 2 (two) times daily as needed for sleep or anxiety. (Patient taking differently: Take 0.5 mg by mouth 2 (two) times daily.)   [DISCONTINUED] atenolol  (TENORMIN ) 50 MG tablet Take 1 tablet (50 mg total) by mouth daily.   [DISCONTINUED] clopidogrel  (PLAVIX ) 75 MG tablet Take 1 tablet (75 mg total) by mouth daily.   [DISCONTINUED] losartan  (COZAAR ) 50 MG tablet Take 50 mg by mouth daily.   [DISCONTINUED] pantoprazole  (PROTONIX ) 40 MG tablet Take 40 mg by mouth daily.   [DISCONTINUED] pravastatin  (PRAVACHOL ) 40 MG tablet Take 40 mg by mouth  daily.   alendronate  (FOSAMAX ) 70 MG tablet Take 1 tablet (70 mg total) by mouth once a week.   ALPRAZolam  (XANAX ) 0.5 MG tablet Take 1 tablet (0.5 mg total) by mouth 2 (two) times daily as needed for sleep or anxiety.   atenolol  (TENORMIN ) 50 MG tablet Take 1 tablet (50 mg total) by mouth daily.   clopidogrel  (PLAVIX ) 75 MG tablet Take 1 tablet (75 mg total) by mouth daily.   losartan  (COZAAR ) 50 MG tablet Take 1 tablet (50 mg total) by mouth daily.   pantoprazole  (PROTONIX ) 40 MG tablet Take 1 tablet (40 mg total) by mouth daily.   pravastatin  (PRAVACHOL ) 40 MG tablet Take 1 tablet (40 mg total) by mouth daily.   [DISCONTINUED] albuterol  (VENTOLIN  HFA) 108 (90 Base) MCG/ACT inhaler Inhale 2 puffs into the lungs every 6 (six) hours as needed. (Patient not taking: Reported on 12/07/2024)   [DISCONTINUED] benzonatate  (TESSALON ) 100 MG capsule Take 1 capsule (100 mg total) by mouth 3 (three) times daily as needed for cough. (Patient not taking: Reported on 12/07/2024)   [DISCONTINUED] doxycycline  (VIBRAMYCIN ) 100 MG capsule Take 1 capsule (100 mg total) by mouth 2 (two) times daily. (Patient not taking: Reported on 12/07/2024)   [DISCONTINUED] iron polysaccharides (NIFEREX) 150 MG capsule Take 150 mg by mouth daily. (Patient not taking: Reported on 12/07/2024)   [DISCONTINUED] traMADol  (  ULTRAM ) 50 MG tablet Take 1 tablet (50 mg total) by mouth every 6 (six) hours as needed. (Patient not taking: Reported on 12/07/2024)   [DISCONTINUED] traMADol  (ULTRAM ) 50 MG tablet Take 1 tablet (50 mg total) by mouth every 6 (six) hours as needed. (Patient not taking: Reported on 12/07/2024)   [DISCONTINUED] Zinc Sulfate (ZINC 15 PO) Take by mouth. (Patient not taking: Reported on 12/07/2024)   [DISCONTINUED] zinc sulfate 220 (50 Zn) MG capsule Take 220 mg by mouth daily. (Patient not taking: Reported on 12/07/2024)   No facility-administered encounter medications on file as of 12/07/2024.    Past Medical History:   Diagnosis Date   Anemia    Anxiety    BPV (benign positional vertigo)    Hiatal hernia    HTN (hypertension)    Hyperlipidemia    Hypokalemia    Stroke [facility])    Vertigo     Past Surgical History:  Procedure Laterality Date   CATARACT EXTRACTION Right    ORIF ANKLE FRACTURE Right 03/12/2023   Procedure: OPEN REDUCTION INTERNAL FIXATION (ORIF) ANKLE FRACTURE;  Surgeon: Onesimo Oneil LABOR, MD;  Location: [facility];  Service: Orthopedics;  Laterality: Right;  9 am start requested   TUBAL LIGATION      Family History  Problem Relation Age of Onset   Diabetes Mother    Cancer Mother        breast    Heart failure Mother    Cancer Father        lymphoma   Cancer Sister    Migraines Sister    Heart failure Brother    Heart attack Brother    Cancer Brother        stomach    Social History   Socioeconomic History   Marital status: Married    Spouse name: Not on file   Number of children: Not on file   Years of education: Not on file   Highest education level: Not on file  Occupational History   Not on file  Tobacco Use   Smoking status: Never   Smokeless tobacco: Never  Substance and Sexual Activity   Alcohol use: No    Alcohol/week: 0.0 standard drinks of alcohol   Drug use: No   Sexual activity: Not Currently    Partners: Male  Other Topics Concern   Not on file  Social History Narrative   Not on file   Social Drivers of Health   Tobacco Use: Low Risk (12/07/2024)   Patient History    Smoking Tobacco Use: Never    Smokeless Tobacco Use: Never    Passive Exposure: Not on file  Financial Resource Strain: Not on file  Food Insecurity: Not on file  Transportation Needs: Not on file  Physical Activity: Not on file  Stress: Not on file  Social Connections: Not on file  Intimate Partner Violence: Not on file  Depression (EYV7-0): Not on file  Alcohol Screen: Not on file  Housing: Not on file  Utilities: Not on file  Health Literacy: Not on file   ROS       Objective    BP (!) 170/80 (BP Location: Left Arm, Patient Position: Sitting, Cuff Size: Normal)   Pulse 87   Ht 5' 4 (1.626 m)   Wt 175 lb 1.9 oz (79.4 kg)   SpO2 97%   BMI 30.06 kg/m   Physical Exam Vitals and nursing note reviewed.  Constitutional:      Appearance: Normal appearance.  HENT:     Head: Normocephalic.  Eyes:     Extraocular Movements: Extraocular movements intact.     Pupils: Pupils are equal, round, and reactive to light.  Cardiovascular:     Rate and Rhythm: Normal rate and regular rhythm.  Pulmonary:     Effort: Pulmonary effort is normal.     Breath sounds: Normal breath sounds.  Musculoskeletal:     Cervical back: Normal range of motion and neck supple.  Neurological:     Mental Status: She is alert and oriented to person, place, and time.  Psychiatric:        Mood and Affect: Mood normal.        Thought Content: Thought content normal.    {Labs (Optional):23779}    Assessment & Plan:   Problem List Items Addressed This Visit       Cardiovascular and Mediastinum   Essential hypertension, benign   Relevant Medications   losartan  (COZAAR ) 50 MG tablet   atenolol  (TENORMIN ) 50 MG tablet   pravastatin  (PRAVACHOL ) 40 MG tablet   Other Relevant Orders   CMP14+EGFR   Acute ischemic stroke (HCC) - Primary   Relevant Medications   clopidogrel  (PLAVIX ) 75 MG tablet   losartan  (COZAAR ) 50 MG tablet   atenolol  (TENORMIN ) 50 MG tablet   pravastatin  (PRAVACHOL ) 40 MG tablet     Digestive   GERD (gastroesophageal reflux disease)   Relevant Medications   pantoprazole  (PROTONIX ) 40 MG tablet   Other Relevant Orders   CBC     Nervous and Auditory   Occipital neuralgia of right side   Relevant Medications   atenolol  (TENORMIN ) 50 MG tablet   ALPRAZolam  (XANAX ) 0.5 MG tablet     Musculoskeletal and Integument   Age-related osteoporosis without current pathological fracture   Relevant Medications   alendronate  (FOSAMAX ) 70 MG tablet    pravastatin  (PRAVACHOL ) 40 MG tablet     Other   Anxiety state   Relevant Medications   ALPRAZolam  (XANAX ) 0.5 MG tablet   Hyperlipidemia, unspecified   Relevant Medications   losartan  (COZAAR ) 50 MG tablet   atenolol  (TENORMIN ) 50 MG tablet   pravastatin  (PRAVACHOL ) 40 MG tablet   Other Relevant Orders   Lipid Profile   TSH + free T4   Other Visit Diagnoses       BMI 30.0-30.9,adult       Relevant Orders   HgB A1c       No follow-ups on file.   Leita Longs, FNP

## 2024-12-08 LAB — CBC
Hematocrit: 40 % (ref 34.0–46.6)
Hemoglobin: 13.5 g/dL (ref 11.1–15.9)
MCH: 31.6 pg (ref 26.6–33.0)
MCHC: 33.8 g/dL (ref 31.5–35.7)
MCV: 94 fL (ref 79–97)
Platelets: 267 x10E3/uL (ref 150–450)
RBC: 4.27 x10E6/uL (ref 3.77–5.28)
RDW: 13 % (ref 11.7–15.4)
WBC: 8.7 x10E3/uL (ref 3.4–10.8)

## 2024-12-08 LAB — CMP14+EGFR
ALT: 50 IU/L — ABNORMAL HIGH (ref 0–32)
AST: 61 IU/L — ABNORMAL HIGH (ref 0–40)
Albumin: 4.3 g/dL (ref 3.7–4.7)
Alkaline Phosphatase: 78 IU/L (ref 48–129)
BUN/Creatinine Ratio: 20 (ref 12–28)
BUN: 16 mg/dL (ref 8–27)
Bilirubin Total: 0.9 mg/dL (ref 0.0–1.2)
CO2: 26 mmol/L (ref 20–29)
Calcium: 9.7 mg/dL (ref 8.7–10.3)
Chloride: 96 mmol/L (ref 96–106)
Creatinine, Ser: 0.79 mg/dL (ref 0.57–1.00)
Globulin, Total: 2.6 g/dL (ref 1.5–4.5)
Glucose: 155 mg/dL — ABNORMAL HIGH (ref 70–99)
Potassium: 4.9 mmol/L (ref 3.5–5.2)
Sodium: 136 mmol/L (ref 134–144)
Total Protein: 6.9 g/dL (ref 6.0–8.5)
eGFR: 75 mL/min/1.73 (ref >59)

## 2024-12-08 LAB — LIPID PANEL
Chol/HDL Ratio: 4.7 ratio — ABNORMAL HIGH (ref 0.0–4.4)
Cholesterol, Total: 185 mg/dL (ref 100–199)
HDL: 39 mg/dL — ABNORMAL LOW (ref >39)
LDL Chol Calc (NIH): 115 mg/dL — ABNORMAL HIGH (ref 0–99)
Triglycerides: 174 mg/dL — ABNORMAL HIGH (ref 0–149)
VLDL Cholesterol Cal: 31 mg/dL (ref 5–40)

## 2024-12-08 LAB — TSH+FREE T4
Free T4: 1.03 ng/dL (ref 0.82–1.77)
TSH: 3.45 u[IU]/mL (ref 0.450–4.500)

## 2024-12-08 LAB — HEMOGLOBIN A1C
Est. average glucose Bld gHb Est-mCnc: 192 mg/dL
Hgb A1c MFr Bld: 8.3 % — ABNORMAL HIGH (ref 4.8–5.6)

## 2024-12-10 ENCOUNTER — Other Ambulatory Visit: Payer: Self-pay

## 2024-12-10 ENCOUNTER — Ambulatory Visit: Payer: Self-pay

## 2024-12-10 DIAGNOSIS — E119 Type 2 diabetes mellitus without complications: Secondary | ICD-10-CM

## 2024-12-10 DIAGNOSIS — Z6830 Body mass index (BMI) 30.0-30.9, adult: Secondary | ICD-10-CM | POA: Insufficient documentation

## 2024-12-10 MED ORDER — METFORMIN HCL ER 500 MG PO TB24: 500.0000 mg | ORAL_TABLET | Freq: Every day | ORAL | 3 refills | Status: AC

## 2024-12-10 NOTE — Assessment & Plan Note (Signed)
 Stable with atenolol  and prn xanax .

## 2024-12-10 NOTE — Assessment & Plan Note (Signed)
 Current medication effective after switch from Prevacid.

## 2024-12-10 NOTE — Assessment & Plan Note (Signed)
 Xanax  renewed for prn use of anxiety.  PDMP reviewed.

## 2024-12-10 NOTE — Assessment & Plan Note (Signed)
 Blood pressure elevated today. On losartan  and atenolol . No home monitoring. - Rechecked blood pressure. - Encouraged home blood pressure monitoring.

## 2024-12-10 NOTE — Assessment & Plan Note (Signed)
 History of stroke in 2023, fully recovered. On clopidogrel  for prevention. - Refilled clopidogrel  prescription.

## 2024-12-10 NOTE — Assessment & Plan Note (Signed)
 Check A1C level.  Recommend weight reduction with healthy diet and regular exercise.

## 2024-12-10 NOTE — Assessment & Plan Note (Signed)
 Check fasting labs today.  Pravastatin  renewed.  F/U according to lab results.

## 2024-12-10 NOTE — Assessment & Plan Note (Signed)
 On Fosamax  and calcium . Compliant with calcium  intake.

## 2025-03-02 ENCOUNTER — Ambulatory Visit: Admitting: Neurology

## 2025-06-07 ENCOUNTER — Ambulatory Visit: Payer: Self-pay
